# Patient Record
Sex: Female | Born: 1955 | Race: White | Hispanic: No | Marital: Married | Smoking: Never smoker
Health system: Southern US, Community
[De-identification: ages and names within clinical notes are randomized; demographics above are authoritative.]

## PROBLEM LIST (undated history)

## (undated) DIAGNOSIS — T8859XA Other complications of anesthesia, initial encounter: Secondary | ICD-10-CM

## (undated) DIAGNOSIS — Z853 Personal history of malignant neoplasm of breast: Secondary | ICD-10-CM

## (undated) DIAGNOSIS — C801 Malignant (primary) neoplasm, unspecified: Secondary | ICD-10-CM

## (undated) DIAGNOSIS — I10 Essential (primary) hypertension: Secondary | ICD-10-CM

## (undated) DIAGNOSIS — C50911 Malignant neoplasm of unspecified site of right female breast: Secondary | ICD-10-CM

## (undated) DIAGNOSIS — R7303 Prediabetes: Secondary | ICD-10-CM

## (undated) DIAGNOSIS — Z9889 Other specified postprocedural states: Secondary | ICD-10-CM

## (undated) DIAGNOSIS — K759 Inflammatory liver disease, unspecified: Secondary | ICD-10-CM

## (undated) DIAGNOSIS — B029 Zoster without complications: Secondary | ICD-10-CM

## (undated) DIAGNOSIS — C50919 Malignant neoplasm of unspecified site of unspecified female breast: Secondary | ICD-10-CM

## (undated) DIAGNOSIS — R112 Nausea with vomiting, unspecified: Secondary | ICD-10-CM

## (undated) DIAGNOSIS — C189 Malignant neoplasm of colon, unspecified: Secondary | ICD-10-CM

## (undated) HISTORY — PX: BREAST BIOPSY: SHX20

## (undated) HISTORY — PX: COLONOSCOPY: SHX174

## (undated) HISTORY — DX: Zoster without complications: B02.9

## (undated) HISTORY — DX: Personal history of malignant neoplasm of breast: Z85.3

## (undated) HISTORY — DX: Malignant neoplasm of unspecified site of right female breast: C50.911

## (undated) HISTORY — DX: Other specified postprocedural states: Z98.890

## (undated) HISTORY — DX: Inflammatory liver disease, unspecified: K75.9

## (undated) HISTORY — DX: Prediabetes: R73.03

## (undated) HISTORY — DX: Essential (primary) hypertension: I10

## (undated) HISTORY — DX: Malignant (primary) neoplasm, unspecified: C80.1

---

## 1997-06-11 DIAGNOSIS — I10 Essential (primary) hypertension: Secondary | ICD-10-CM

## 1997-06-11 HISTORY — DX: Essential (primary) hypertension: I10

## 2005-03-21 ENCOUNTER — Ambulatory Visit: Payer: Self-pay | Admitting: General Surgery

## 2006-06-06 ENCOUNTER — Ambulatory Visit: Payer: Self-pay | Admitting: General Surgery

## 2007-08-21 ENCOUNTER — Ambulatory Visit: Payer: Self-pay | Admitting: General Surgery

## 2008-09-08 ENCOUNTER — Ambulatory Visit: Payer: Self-pay | Admitting: General Surgery

## 2009-12-20 ENCOUNTER — Ambulatory Visit: Payer: Self-pay | Admitting: Family Medicine

## 2010-06-11 DIAGNOSIS — C801 Malignant (primary) neoplasm, unspecified: Secondary | ICD-10-CM

## 2010-06-11 DIAGNOSIS — Z853 Personal history of malignant neoplasm of breast: Secondary | ICD-10-CM

## 2010-06-11 DIAGNOSIS — C50919 Malignant neoplasm of unspecified site of unspecified female breast: Secondary | ICD-10-CM

## 2010-06-11 DIAGNOSIS — Z9889 Other specified postprocedural states: Secondary | ICD-10-CM

## 2010-06-11 HISTORY — DX: Malignant neoplasm of unspecified site of unspecified female breast: C50.919

## 2010-06-11 HISTORY — PX: MASTECTOMY: SHX3

## 2010-06-11 HISTORY — DX: Personal history of malignant neoplasm of breast: Z85.3

## 2010-06-11 HISTORY — DX: Malignant (primary) neoplasm, unspecified: C80.1

## 2010-06-11 HISTORY — DX: Other specified postprocedural states: Z98.890

## 2010-09-06 ENCOUNTER — Ambulatory Visit: Payer: Self-pay | Admitting: General Surgery

## 2010-10-10 ENCOUNTER — Ambulatory Visit: Payer: Self-pay | Admitting: Oncology

## 2010-10-31 ENCOUNTER — Ambulatory Visit: Payer: Self-pay | Admitting: Oncology

## 2010-11-09 ENCOUNTER — Ambulatory Visit: Payer: Self-pay | Admitting: Anesthesiology

## 2010-11-10 ENCOUNTER — Ambulatory Visit: Payer: Self-pay | Admitting: Oncology

## 2010-11-13 ENCOUNTER — Ambulatory Visit: Payer: Self-pay | Admitting: General Surgery

## 2010-11-14 LAB — PATHOLOGY REPORT

## 2010-11-28 ENCOUNTER — Ambulatory Visit: Payer: Self-pay | Admitting: General Surgery

## 2010-12-01 LAB — PATHOLOGY REPORT

## 2010-12-12 ENCOUNTER — Ambulatory Visit: Payer: Self-pay | Admitting: Oncology

## 2011-01-10 ENCOUNTER — Ambulatory Visit: Payer: Self-pay | Admitting: Oncology

## 2011-02-10 ENCOUNTER — Ambulatory Visit: Payer: Self-pay | Admitting: Oncology

## 2011-03-12 ENCOUNTER — Ambulatory Visit: Payer: Self-pay | Admitting: Oncology

## 2011-04-12 ENCOUNTER — Ambulatory Visit: Payer: Self-pay | Admitting: Oncology

## 2011-05-12 ENCOUNTER — Ambulatory Visit: Payer: Self-pay | Admitting: Oncology

## 2011-08-20 ENCOUNTER — Ambulatory Visit: Payer: Self-pay | Admitting: Oncology

## 2011-08-20 LAB — COMPREHENSIVE METABOLIC PANEL
Albumin: 3.5 g/dL (ref 3.4–5.0)
Alkaline Phosphatase: 12 U/L — ABNORMAL LOW (ref 50–136)
BUN: 16 mg/dL (ref 7–18)
Bilirubin,Total: 0.4 mg/dL (ref 0.2–1.0)
Chloride: 103 mmol/L (ref 98–107)
Co2: 27 mmol/L (ref 21–32)
Creatinine: 0.96 mg/dL (ref 0.60–1.30)
EGFR (African American): >60
EGFR (Non-African Amer.): >60
Glucose: 128 mg/dL — ABNORMAL HIGH (ref 65–99)
Osmolality: 286 (ref 275–301)
Potassium: 3.7 mmol/L (ref 3.5–5.1)
SGPT (ALT): 74 U/L

## 2011-08-20 LAB — CBC CANCER CENTER
Basophil #: 0.1 x10 3/mm (ref 0.0–0.1)
Basophil %: 2.1 %
Eosinophil #: 0.4 x10 3/mm (ref 0.0–0.7)
Eosinophil %: 6.7 %
HCT: 37.6 % (ref 35.0–47.0)
HGB: 13 g/dL (ref 12.0–16.0)
Lymphocyte %: 32.9 %
MCH: 28 pg (ref 26.0–34.0)
MCHC: 34.5 g/dL (ref 32.0–36.0)
MCV: 81 fL (ref 80–100)
Monocyte #: 0.4 x10 3/mm (ref 0.0–0.7)
RDW: 15.7 % — ABNORMAL HIGH (ref 11.5–14.5)

## 2011-09-10 ENCOUNTER — Ambulatory Visit: Payer: Self-pay | Admitting: Oncology

## 2011-09-10 ENCOUNTER — Ambulatory Visit: Payer: Self-pay | Admitting: General Surgery

## 2011-11-21 ENCOUNTER — Ambulatory Visit: Payer: Self-pay | Admitting: General Surgery

## 2011-12-06 ENCOUNTER — Ambulatory Visit: Payer: Self-pay | Admitting: Anesthesiology

## 2011-12-06 LAB — POTASSIUM: Potassium: 3.7 mmol/L (ref 3.5–5.1)

## 2011-12-14 ENCOUNTER — Inpatient Hospital Stay: Payer: Self-pay | Admitting: General Surgery

## 2011-12-14 HISTORY — PX: COLECTOMY: SHX59

## 2011-12-15 LAB — BASIC METABOLIC PANEL
BUN: 11 mg/dL (ref 7–18)
Chloride: 100 mmol/L (ref 98–107)
Creatinine: 0.82 mg/dL (ref 0.60–1.30)
EGFR (African American): >60
EGFR (Non-African Amer.): >60
Glucose: 144 mg/dL — ABNORMAL HIGH (ref 65–99)
Osmolality: 270 (ref 275–301)
Potassium: 3.8 mmol/L (ref 3.5–5.1)
Sodium: 134 mmol/L — ABNORMAL LOW (ref 136–145)

## 2011-12-15 LAB — CBC WITH DIFFERENTIAL/PLATELET
Basophil %: 0.1 %
Eosinophil #: 0 10*3/uL (ref 0.0–0.7)
Eosinophil %: 0 %
HGB: 12 g/dL (ref 12.0–16.0)
MCH: 27.6 pg (ref 26.0–34.0)
MCHC: 33.3 g/dL (ref 32.0–36.0)
Monocyte #: 0.7 x10 3/mm (ref 0.2–0.9)
Neutrophil %: 86.2 %
Platelet: 249 10*3/uL (ref 150–440)

## 2011-12-16 LAB — CBC WITH DIFFERENTIAL/PLATELET
Basophil #: 0 x10 3/mm 3
Basophil %: 0.4 %
Eosinophil #: 0.1 x10 3/mm 3
Eosinophil %: 1.5 %
HCT: 36.9 %
HGB: 12.2 g/dL
Lymphocyte %: 17.8 %
Lymphs Abs: 1.5 x10 3/mm 3
MCH: 27.4 pg
MCHC: 33 g/dL
MCV: 83 fL
Monocyte #: 0.5 "x10 3/mm "
Monocyte %: 6.3 %
Neutrophil #: 6.4 x10 3/mm 3
Neutrophil %: 74 %
Platelet: 238 x10 3/mm 3
RBC: 4.45 X10 6/mm 3
RDW: 14 %
WBC: 8.7 x10 3/mm 3

## 2011-12-16 LAB — POTASSIUM: Potassium: 3.5 mmol/L

## 2012-02-18 ENCOUNTER — Ambulatory Visit: Payer: Self-pay | Admitting: Oncology

## 2012-02-18 LAB — COMPREHENSIVE METABOLIC PANEL
Albumin: 3.9 g/dL (ref 3.4–5.0)
Alkaline Phosphatase: 117 U/L (ref 50–136)
BUN: 10 mg/dL (ref 7–18)
Bilirubin,Total: 0.3 mg/dL (ref 0.2–1.0)
Calcium, Total: 9.6 mg/dL (ref 8.5–10.1)
Co2: 31 mmol/L (ref 21–32)
Creatinine: 1.05 mg/dL (ref 0.60–1.30)
EGFR (Non-African Amer.): >60
Glucose: 116 mg/dL — ABNORMAL HIGH (ref 65–99)
Osmolality: 283 (ref 275–301)
SGPT (ALT): 50 U/L (ref 12–78)
Sodium: 142 mmol/L (ref 136–145)
Total Protein: 8.3 g/dL — ABNORMAL HIGH (ref 6.4–8.2)

## 2012-02-18 LAB — CBC CANCER CENTER
Basophil #: 0.1 x10 3/mm (ref 0.0–0.1)
Eosinophil #: 0.1 x10 3/mm (ref 0.0–0.7)
Lymphocyte #: 1.8 x10 3/mm (ref 1.0–3.6)
MCH: 26.6 pg (ref 26.0–34.0)
MCHC: 32.2 g/dL (ref 32.0–36.0)
MCV: 83 fL (ref 80–100)
Monocyte #: 0.4 x10 3/mm (ref 0.2–0.9)
Monocyte %: 7.2 %
Neutrophil #: 2.8 x10 3/mm (ref 1.4–6.5)
Neutrophil %: 53.6 %
Platelet: 264 x10 3/mm (ref 150–440)
RBC: 4.82 10*6/uL (ref 3.80–5.20)
RDW: 14.8 % — ABNORMAL HIGH (ref 11.5–14.5)

## 2012-02-19 LAB — CANCER ANTIGEN 27.29: CA 27.29: 15.3 U/mL (ref 0.0–38.6)

## 2012-03-11 ENCOUNTER — Ambulatory Visit: Payer: Self-pay | Admitting: Oncology

## 2012-05-18 IMAGING — CR CERVICAL SPINE - COMPLETE 4+ VIEW
1 series · 6 of 6 positions shown · non-contrast
Comparison: none

REASON FOR EXAM: chest pain, shortness of breath,cervical pain
COMMENTS:

[Series 1: view not recorded · 0.17mm/px · 6 of 6 slices shown]
[im 1/6]
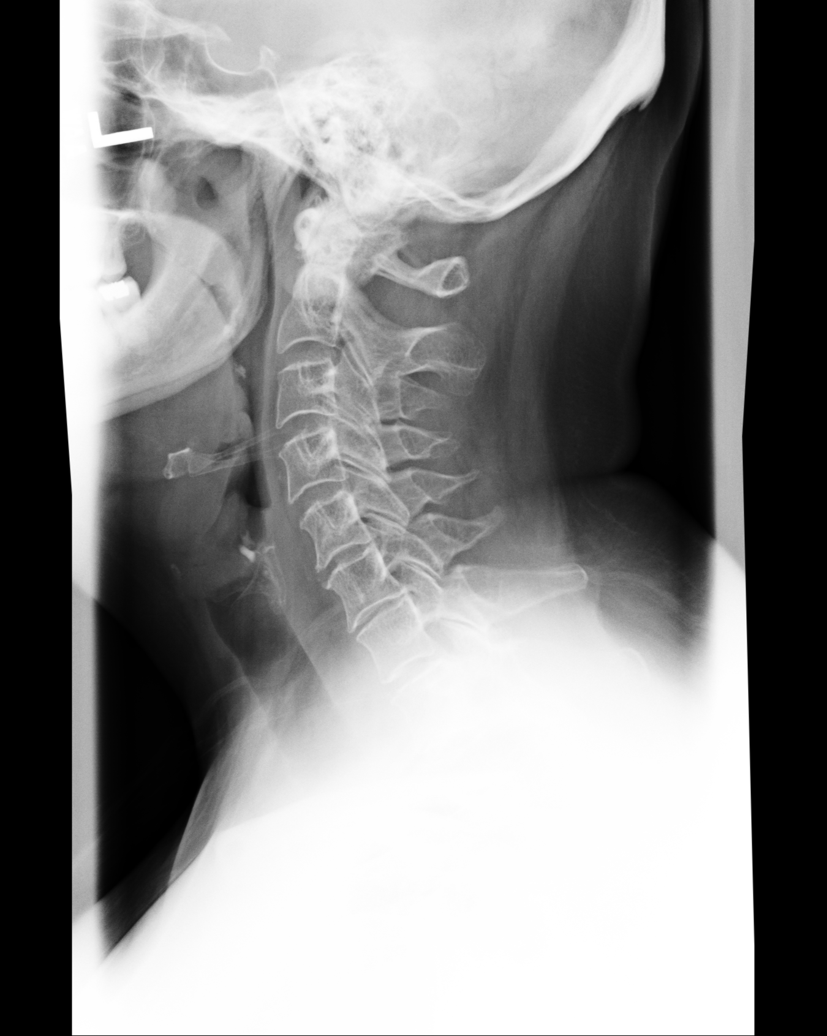
[im 2/6]
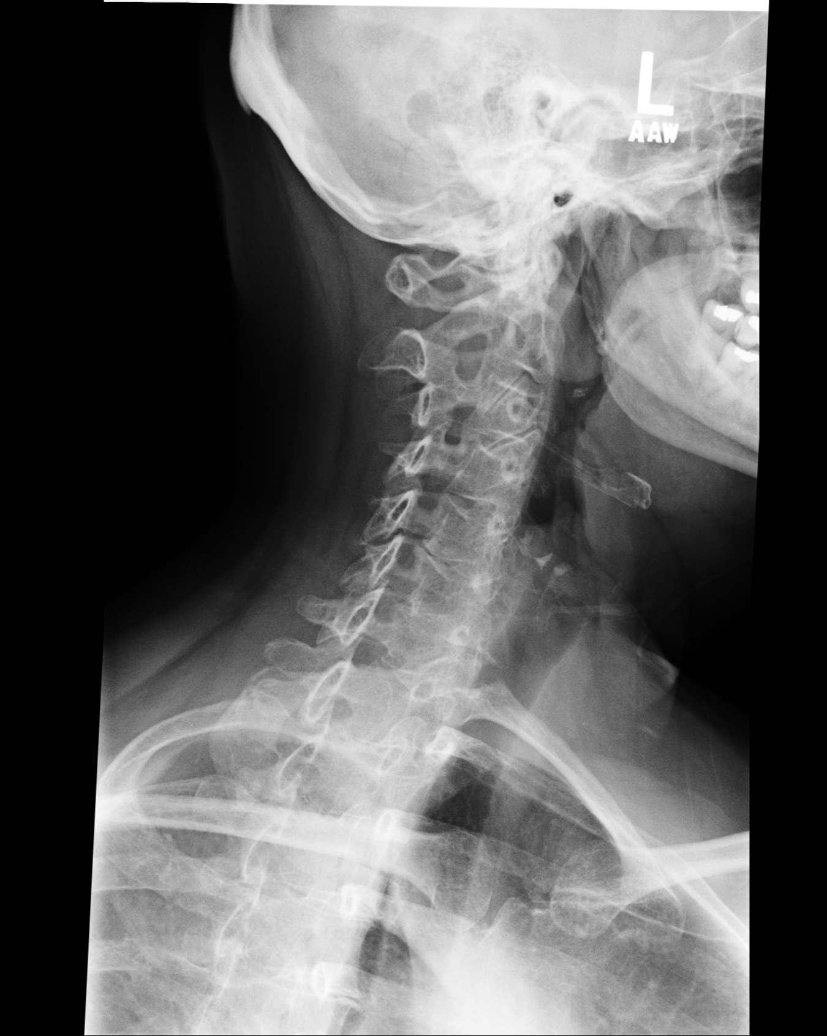
[im 3/6]
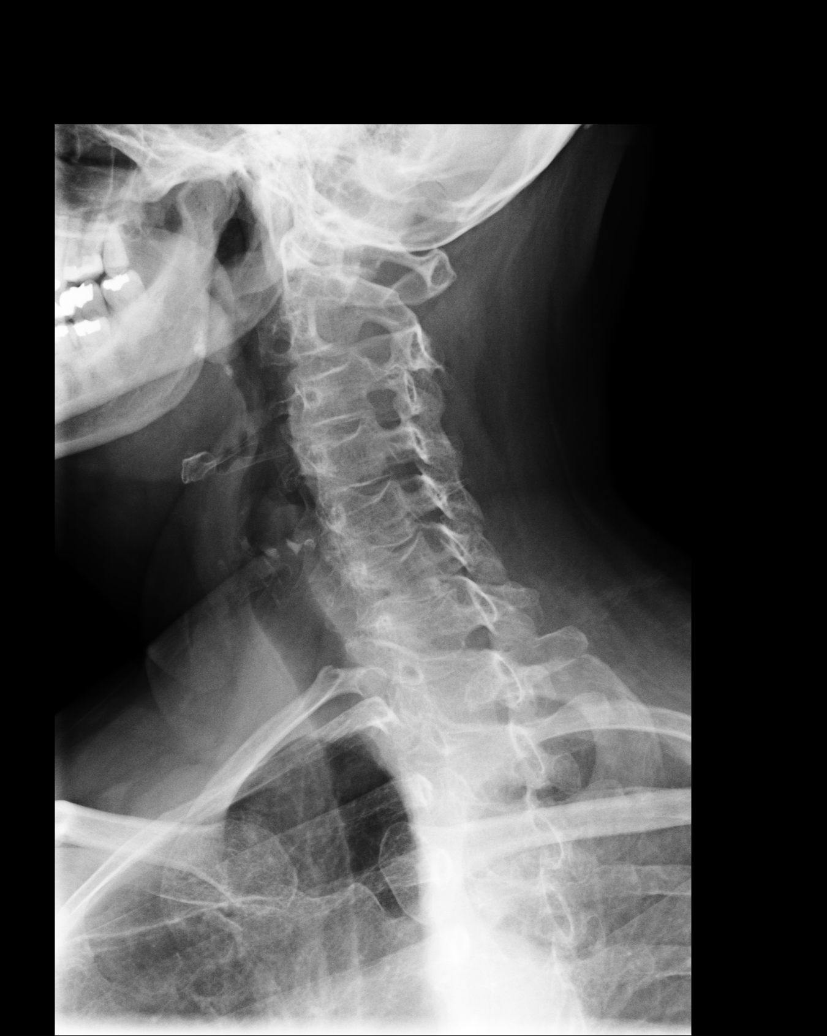
[im 4/6]
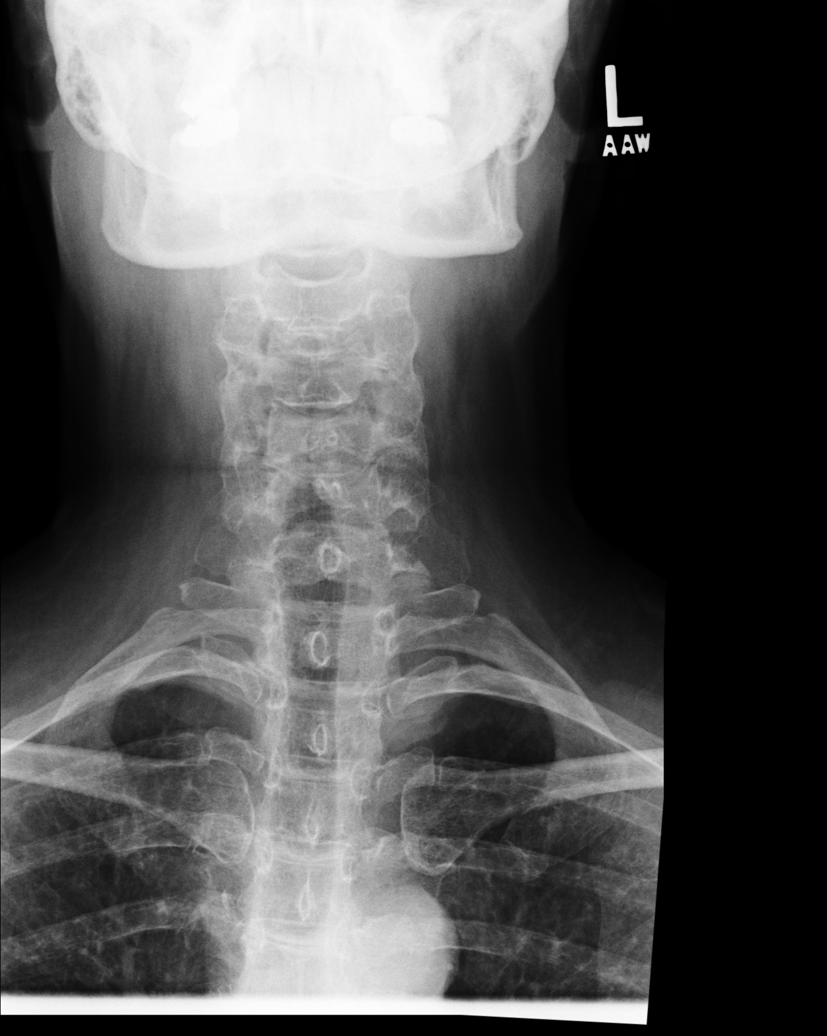
[im 5/6]
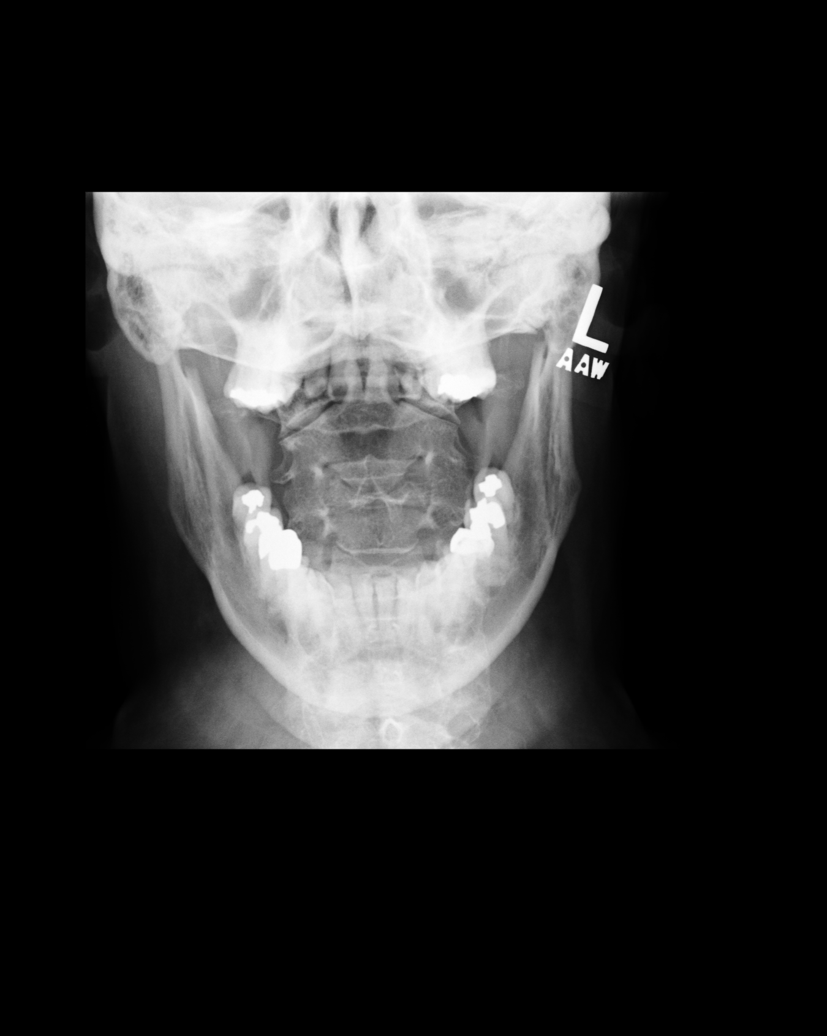
[im 6/6]
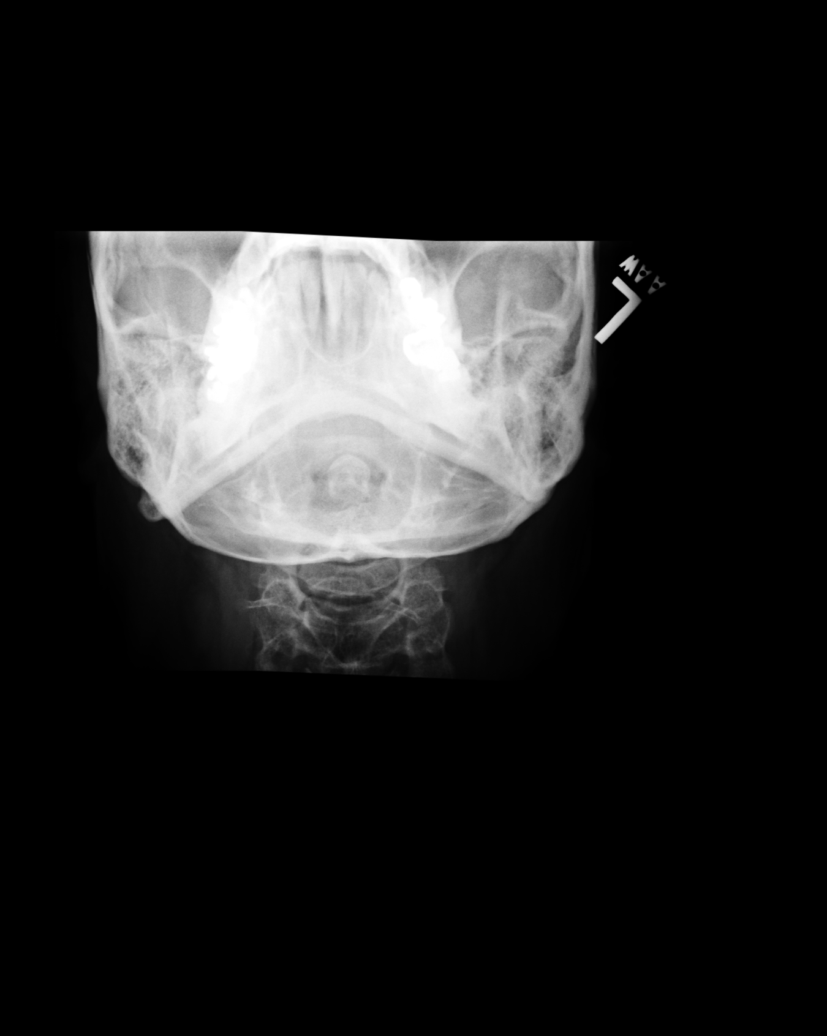

[6 of 6 positions shown; findings below may reference images not displayed]

PROCEDURE:     DXR - DXR CERVICAL SPINE COMPLETE  - December 20, 2009 [DATE]

RESULT:     There is slight spur impingement on the neural foramina
bilaterally at these levels with the findings most prominent at C5-C6 on the
right. The vertebral body alignment is normal. No cervical rib formation is
seen. The odontoid process is intact.
IMPRESSION: 1. No fracture is seen.
2. There are changes consistent with cervical disc disease at C5-C6 and
C6-C7 as noted above and with there being slight spur impingement on the
neural foramina bilaterally at these levels, most prominent at C5-C6 on the
right.

## 2012-06-11 HISTORY — PX: KNEE SURGERY: SHX244

## 2012-07-30 ENCOUNTER — Ambulatory Visit: Payer: Self-pay | Admitting: General Surgery

## 2012-08-01 ENCOUNTER — Encounter: Payer: Self-pay | Admitting: *Deleted

## 2012-08-01 DIAGNOSIS — Z853 Personal history of malignant neoplasm of breast: Secondary | ICD-10-CM | POA: Insufficient documentation

## 2012-08-01 DIAGNOSIS — I10 Essential (primary) hypertension: Secondary | ICD-10-CM | POA: Insufficient documentation

## 2012-08-09 ENCOUNTER — Ambulatory Visit: Payer: Self-pay | Admitting: Oncology

## 2012-08-18 LAB — COMPREHENSIVE METABOLIC PANEL
Anion Gap: 11 (ref 7–16)
Bilirubin,Total: 0.4 mg/dL (ref 0.2–1.0)
Calcium, Total: 9.8 mg/dL (ref 8.5–10.1)
Chloride: 102 mmol/L (ref 98–107)
Co2: 29 mmol/L (ref 21–32)
EGFR (African American): >60
EGFR (Non-African Amer.): 60 — ABNORMAL LOW
Glucose: 122 mg/dL — ABNORMAL HIGH (ref 65–99)
Osmolality: 284 (ref 275–301)
Sodium: 142 mmol/L (ref 136–145)
Total Protein: 8.4 g/dL — ABNORMAL HIGH (ref 6.4–8.2)

## 2012-08-18 LAB — CBC CANCER CENTER
Basophil %: 1.1 %
Eosinophil #: 0.2 x10 3/mm (ref 0.0–0.7)
Eosinophil %: 3.7 %
HCT: 40.2 % (ref 35.0–47.0)
Lymphocyte #: 1.7 x10 3/mm (ref 1.0–3.6)
Lymphocyte %: 29.2 %
MCH: 27.7 pg (ref 26.0–34.0)
MCHC: 33.5 g/dL (ref 32.0–36.0)
Monocyte #: 0.5 x10 3/mm (ref 0.2–0.9)
Neutrophil %: 58.3 %
Platelet: 259 x10 3/mm (ref 150–440)
RBC: 4.87 10*6/uL (ref 3.80–5.20)
RDW: 13.9 % (ref 11.5–14.5)
WBC: 5.9 x10 3/mm (ref 3.6–11.0)

## 2012-08-19 LAB — CANCER ANTIGEN 27.29: CA 27.29: 18.5 U/mL (ref 0.0–38.6)

## 2012-09-09 ENCOUNTER — Ambulatory Visit: Payer: Self-pay | Admitting: Oncology

## 2012-09-10 ENCOUNTER — Ambulatory Visit: Payer: Self-pay | Admitting: General Surgery

## 2012-09-11 ENCOUNTER — Encounter: Payer: Self-pay | Admitting: General Surgery

## 2012-10-02 ENCOUNTER — Encounter: Payer: Self-pay | Admitting: General Surgery

## 2012-10-02 ENCOUNTER — Ambulatory Visit (INDEPENDENT_AMBULATORY_CARE_PROVIDER_SITE_OTHER): Payer: No Typology Code available for payment source | Admitting: General Surgery

## 2012-10-02 ENCOUNTER — Other Ambulatory Visit: Payer: Self-pay | Admitting: *Deleted

## 2012-10-02 VITALS — BP 130/70 | HR 76 | Resp 14 | Ht 67.5 in | Wt 198.0 lb

## 2012-10-02 DIAGNOSIS — Z86010 Personal history of colonic polyps: Secondary | ICD-10-CM

## 2012-10-02 DIAGNOSIS — Z1211 Encounter for screening for malignant neoplasm of colon: Secondary | ICD-10-CM

## 2012-10-02 DIAGNOSIS — Z853 Personal history of malignant neoplasm of breast: Secondary | ICD-10-CM

## 2012-10-02 MED ORDER — POLYETHYLENE GLYCOL 3350 17 GM/SCOOP PO POWD: ORAL | Status: DC

## 2012-10-02 NOTE — Progress Notes (Signed)
The patient has been asked to return to the office in one year for a unilateral left breast diagnostic mammogram.  Patient has been scheduled for a colonoscopy on 11-12-12 at [facility].

## 2012-10-02 NOTE — Patient Instructions (Addendum)
Patient to return in 1 year with left diagnostic mammogram. Colonoscopy to be done in June 2014. Advised her to discuss with Dr.Choksi regarding antihormonal therapy for her breast cancer.  Colonoscopy A colonoscopy is an exam to evaluate your entire colon. In this exam, your colon is cleansed. A long fiberoptic tube is inserted through your rectum and into your colon. The fiberoptic scope (endoscope) is a long bundle of enclosed and very flexible fibers. These fibers transmit light to the area examined and send images from that area to your caregiver. Discomfort is usually minimal. You may be given a drug to help you sleep (sedative) during or prior to the procedure. This exam helps to detect lumps (tumors), polyps, inflammation, and areas of bleeding. Your caregiver may also take a small piece of tissue (biopsy) that will be examined under a microscope. LET YOUR CAREGIVER KNOW ABOUT:   Allergies to food or medicine.  Medicines taken, including vitamins, herbs, eyedrops, over-the-counter medicines, and creams.  Use of steroids (by mouth or creams).  Previous problems with anesthetics or numbing medicines.  History of bleeding problems or blood clots.  Previous surgery.  Other health problems, including diabetes and kidney problems.  Possibility of pregnancy, if this applies. BEFORE THE PROCEDURE   A clear liquid diet may be required for 2 days before the exam.  Ask your caregiver about changing or stopping your regular medications.  Liquid injections (enemas) or laxatives may be required.  A large amount of electrolyte solution may be given to you to drink over a short period of time. This solution is used to clean out your colon.  You should be present 60 minutes prior to your procedure or as directed by your caregiver. AFTER THE PROCEDURE   If you received a sedative or pain relieving medication, you will need to arrange for someone to drive you home.  Occasionally, there is a  little blood passed with the first bowel movement. Do not be concerned. FINDING OUT THE RESULTS OF YOUR TEST Not all test results are available during your visit. If your test results are not back during the visit, make an appointment with your caregiver to find out the results. Do not assume everything is normal if you have not heard from your caregiver or the medical facility. It is important for you to follow up on all of your test results. HOME CARE INSTRUCTIONS   It is not unusual to pass moderate amounts of gas and experience mild abdominal cramping following the procedure. This is due to air being used to inflate your colon during the exam. Walking or a warm pack on your belly (abdomen) may help.  You may resume all normal meals and activities after sedatives and medicines have worn off.  Only take over-the-counter or prescription medicines for pain, discomfort, or fever as directed by your caregiver. Do not use aspirin or blood thinners if a biopsy was taken. Consult your caregiver for medicine usage if biopsies were taken. SEEK IMMEDIATE MEDICAL CARE IF:   You have a fever.  You pass large blood clots or fill a toilet with blood following the procedure. This may also occur 10 to 14 days following the procedure. This is more likely if a biopsy was taken.  You develop abdominal pain that keeps getting worse and cannot be relieved with medicine. Document Released: 05/25/2000 Document Revised: 08/20/2011 Document Reviewed: 01/08/2008 [facility] Patient Information 2013 [place], [place].

## 2012-10-02 NOTE — Progress Notes (Signed)
Patient ID: Shirley Blackwell, female   DOB: November 19, 1955, 57 y.o.   MRN: [id number]  Chief Complaint  Patient presents with  . Follow-up    mammogram    HPI Shirley Blackwell is a 57 y.o. female who presents for a follow up mammogram. The most recent mammogram was done on 09/10/12 with birad category  2. The patient has a history of right breast cancer diagnosed in 2012. She underwent a total right breast mastectomy in June 2012. She was treated with Chemotherapy and Femara. She was taken off of Femara in January 2014 due to side effects. She was offered Taxoxifen but decided not to taken because of side effects of possible uterian cancer. No new problems at this time. She had a colectomy for a very large polyp in right colon done in 2013 and has noticed that she has diarrhea a lot more than before she had the surgery. She has changed her diet drastically by not eating anything with added sugar. She states the diarrhea comes when she eats out and if she has extra sugar intake.   HPI  Past Medical History  Diagnosis Date  . Hepatitis   . Hypertension 1999  . Personal history of malignant neoplasm of breast 2012    R Mastectomy,  . Cancer 2012    right mastectomy invasive lobular CA,  . Hx of lumpectomy 2012    right  . Benign neoplasm of colon 2013    Past Surgical History  Procedure Laterality Date  . Mastectomy Right 2012    invasive lobular CA,  . Colectomy  2013  . Colonoscopy  2013    Dr. Evette Cristal    History reviewed. No pertinent family history.  Social History History  Substance Use Topics  . Smoking status: Never Smoker   . Smokeless tobacco: Not on file  . Alcohol Use: No    Allergies  Allergen Reactions  . Sulfa Antibiotics Hives  . Tape Other (See Comments)    blisters    Current Outpatient Prescriptions  Medication Sig Dispense Refill  . amLODipine-benazepril (LOTREL) 5-10 MG per capsule Take 1 capsule by mouth daily.      . Multiple Vitamin (MULTIVITAMIN) capsule Take  1 capsule by mouth daily.      Marland Kitchen triamterene-hydrochlorothiazide (DYAZIDE) 37.5-25 MG per capsule Take 0.5 capsules by mouth every morning.      . polyethylene glycol powder (GLYCOLAX/MIRALAX) powder 255 grams one bottle for colonoscopy prep  255 g  0   No current facility-administered medications for this visit.    Review of Systems Review of Systems  Constitutional: Negative.   Respiratory: Negative.   Cardiovascular: Negative.     Blood pressure 130/70, pulse 76, resp. rate 14, height 5' 7.5" (1.715 m), weight 198 lb (89.812 kg).  Physical Exam Physical Exam  Constitutional: She is oriented to person, place, and time. She appears well-developed and well-nourished.  Eyes: Conjunctivae are normal. No scleral icterus.  Neck: Trachea normal. No mass and no thyromegaly present.  Cardiovascular: Normal rate, regular rhythm and normal heart sounds.   No murmur heard. Pulses:      Dorsalis pedis pulses are 3+ on the right side, and 3+ on the left side.       Posterior tibial pulses are 3+ on the right side, and 3+ on the left side.  No leg edema. No varicose veins.   Pulmonary/Chest: Effort normal and breath sounds normal. Left breast exhibits no inverted nipple, no mass, no nipple discharge,  no skin change and no tenderness.  Right mastectomy site well healed with no evidence of local reoccurance.   Abdominal: Soft. Normal appearance and bowel sounds are normal. There is no hepatosplenomegaly. There is no tenderness. No hernia.  Lymphadenopathy:    She has no cervical adenopathy.    She has no axillary adenopathy.  Neurological: She is alert and oriented to person, place, and time.  Skin: Skin is warm and dry.    Data Reviewed Mammogram reviewed. Stable.   Assessment    Stable exam.     Plan    Pt needs colonoscopy-1 yr post colectomy, in June. Next f/u here in 1 yr with left mammogram        Shirley Blackwell G 10/03/2012, 6:04 AM

## 2012-10-03 ENCOUNTER — Encounter: Payer: Self-pay | Admitting: General Surgery

## 2012-10-17 ENCOUNTER — Ambulatory Visit: Payer: Self-pay | Admitting: Oncology

## 2012-11-07 ENCOUNTER — Other Ambulatory Visit: Payer: Self-pay | Admitting: General Surgery

## 2012-11-07 DIAGNOSIS — Z86010 Personal history of colonic polyps: Secondary | ICD-10-CM

## 2012-11-12 ENCOUNTER — Ambulatory Visit: Payer: Self-pay | Admitting: General Surgery

## 2012-11-12 DIAGNOSIS — Z86010 Personal history of colonic polyps: Secondary | ICD-10-CM

## 2012-11-18 ENCOUNTER — Encounter: Payer: Self-pay | Admitting: General Surgery

## 2012-12-22 ENCOUNTER — Ambulatory Visit: Payer: Self-pay | Admitting: Orthopedic Surgery

## 2012-12-22 LAB — BASIC METABOLIC PANEL
Calcium, Total: 9.4 mg/dL (ref 8.5–10.1)
Chloride: 106 mmol/L (ref 98–107)
Co2: 28 mmol/L (ref 21–32)
Creatinine: 0.76 mg/dL (ref 0.60–1.30)
EGFR (African American): >60
EGFR (Non-African Amer.): >60
Glucose: 97 mg/dL (ref 65–99)
Osmolality: 279 (ref 275–301)
Potassium: 3.8 mmol/L (ref 3.5–5.1)
Sodium: 140 mmol/L (ref 136–145)

## 2012-12-22 LAB — CBC
HCT: 38.7 % (ref 35.0–47.0)
HGB: 12.5 g/dL (ref 12.0–16.0)
MCH: 26.3 pg (ref 26.0–34.0)
MCV: 81 fL (ref 80–100)
Platelet: 263 10*3/uL (ref 150–440)
RDW: 14.3 % (ref 11.5–14.5)
WBC: 5.4 10*3/uL (ref 3.6–11.0)

## 2012-12-22 LAB — PROTIME-INR: Prothrombin Time: 12.9 secs (ref 11.5–14.7)

## 2012-12-22 LAB — APTT: Activated PTT: 32.3 secs (ref 23.6–35.9)

## 2013-02-04 ENCOUNTER — Ambulatory Visit: Payer: Self-pay | Admitting: Orthopedic Surgery

## 2013-02-04 LAB — CBC
HCT: 40.1 % (ref 35.0–47.0)
HGB: 13.7 g/dL (ref 12.0–16.0)
MCV: 82 fL (ref 80–100)
Platelet: 282 10*3/uL (ref 150–440)
RBC: 4.91 10*6/uL (ref 3.80–5.20)
RDW: 14.1 % (ref 11.5–14.5)

## 2013-02-04 LAB — SEDIMENTATION RATE: Erythrocyte Sed Rate: 40 mm/hr — ABNORMAL HIGH (ref 0–30)

## 2013-02-04 LAB — BASIC METABOLIC PANEL
Anion Gap: 9 (ref 7–16)
BUN: 10 mg/dL (ref 7–18)
Chloride: 101 mmol/L (ref 98–107)
EGFR (African American): >60
Potassium: 3.5 mmol/L (ref 3.5–5.1)
Sodium: 137 mmol/L (ref 136–145)

## 2013-02-04 LAB — APTT: Activated PTT: 30.6 secs (ref 23.6–35.9)

## 2013-02-04 LAB — PROTIME-INR: INR: 0.9

## 2013-02-13 ENCOUNTER — Ambulatory Visit: Payer: Self-pay | Admitting: Orthopedic Surgery

## 2013-09-21 ENCOUNTER — Ambulatory Visit: Payer: No Typology Code available for payment source | Admitting: General Surgery

## 2013-10-01 ENCOUNTER — Ambulatory Visit (INDEPENDENT_AMBULATORY_CARE_PROVIDER_SITE_OTHER): Payer: PRIVATE HEALTH INSURANCE | Admitting: General Surgery

## 2013-10-01 ENCOUNTER — Ambulatory Visit: Payer: PRIVATE HEALTH INSURANCE

## 2013-10-01 ENCOUNTER — Encounter: Payer: Self-pay | Admitting: General Surgery

## 2013-10-01 VITALS — BP 130/76 | HR 76 | Resp 14 | Ht 67.5 in | Wt 195.0 lb

## 2013-10-01 DIAGNOSIS — R222 Localized swelling, mass and lump, trunk: Secondary | ICD-10-CM

## 2013-10-01 DIAGNOSIS — Z853 Personal history of malignant neoplasm of breast: Secondary | ICD-10-CM

## 2013-10-01 NOTE — Patient Instructions (Signed)
Patient to return as scheduled next month with mammogram. Continue self breast exams. Call office for any new breast issues or concerns.

## 2013-10-01 NOTE — Progress Notes (Signed)
Patient ID: Shirley Blackwell, female   DOB: [birth year], 58 y.o.   MRN: [id number]  Chief Complaint  Patient presents with  . Follow-up    right mastectomy side under arm nodule    HPI Shirley Blackwell is a 58 y.o. female who presents for a right mastectomy side axilla nodule. She noticed it approximately 1 month ago. She has not notice any change in size. No pain in the area. She is scheduled for a mammogram in May 2015.  HPI  Past Medical History  Diagnosis Date  . Hepatitis   . Hypertension 1999  . Personal history of malignant neoplasm of breast 2012    R Mastectomy,  . Cancer 2012    right mastectomy invasive lobular CA,  . Hx of lumpectomy 2012    right  . Benign neoplasm of colon 2013    Past Surgical History  Procedure Laterality Date  . Mastectomy Right 2012    invasive lobular CA,  . Colonoscopy  2013    Dr. Jamal Collin  . Knee surgery Left 2014  . Colectomy Right 2013    History reviewed. No pertinent family history.  Social History History  Substance Use Topics  . Smoking status: Never Smoker   . Smokeless tobacco: Not on file  . Alcohol Use: No    Allergies  Allergen Reactions  . Sulfa Antibiotics Hives  . Tape Other (See Comments)    blisters    Current Outpatient Prescriptions  Medication Sig Dispense Refill  . amLODipine-benazepril (LOTREL) 5-10 MG per capsule Take 1 capsule by mouth daily.      . Multiple Vitamin (MULTIVITAMIN) capsule Take 1 capsule by mouth daily.      . polyethylene glycol powder (GLYCOLAX/MIRALAX) powder 255 grams one bottle for colonoscopy prep  255 g  0  . triamterene-hydrochlorothiazide (DYAZIDE) 37.5-25 MG per capsule Take 0.5 capsules by mouth every morning.       No current facility-administered medications for this visit.    Review of Systems Review of Systems  Constitutional: Negative.   Respiratory: Negative.   Cardiovascular: Negative.     Blood pressure 130/76, pulse 76, resp. rate 14, height 5' 7.5" (1.715 m),  weight 195 lb (88.451 kg).  Physical Exam Physical Exam  Constitutional: She is oriented to person, place, and time. She appears well-developed and well-nourished.  Pulmonary/Chest:    Lymphadenopathy:    She has no axillary adenopathy.  Neurological: She is alert and oriented to person, place, and time.  Skin: Skin is warm and dry.    Data Reviewed  Ultrasound over palpable thickening showed no findings.   Assessment    Likely resulting scar from axillary and chest incision.     Plan    Follow up as schedule next month with mammogram.        Seeplaputhur G Sankar 10/03/2013, 8:29 AM

## 2013-10-03 ENCOUNTER — Encounter: Payer: Self-pay | Admitting: General Surgery

## 2013-10-15 ENCOUNTER — Ambulatory Visit: Payer: Self-pay | Admitting: General Surgery

## 2013-10-15 ENCOUNTER — Encounter: Payer: Self-pay | Admitting: General Surgery

## 2013-10-22 ENCOUNTER — Encounter: Payer: Self-pay | Admitting: General Surgery

## 2013-10-22 ENCOUNTER — Ambulatory Visit (INDEPENDENT_AMBULATORY_CARE_PROVIDER_SITE_OTHER): Payer: PRIVATE HEALTH INSURANCE | Admitting: General Surgery

## 2013-10-22 VITALS — BP 124/70 | HR 68 | Resp 12 | Ht 67.5 in | Wt 195.0 lb

## 2013-10-22 DIAGNOSIS — Z860100 Personal history of colon polyps, unspecified: Secondary | ICD-10-CM

## 2013-10-22 DIAGNOSIS — Z853 Personal history of malignant neoplasm of breast: Secondary | ICD-10-CM

## 2013-10-22 NOTE — Progress Notes (Signed)
Patient ID: Shirley Blackwell, female   DOB: 1956/05/06, 58 y.o.   MRN: [id number]  Chief Complaint  Patient presents with  . Follow-up    1 year follow up left screening mammogram. History of Breast Cancer    HPI Shirley Blackwell is a 58 y.o. female who presents for a breast evaluation. The patient has a history of right breast cancer diagnosed in 2012. The patient had a right mastectomy at that time. The most recent mammogram was done on 10/15/13. Patient does perform regular self breast checks and gets regular mammograms done. The patient denies any new problems with the breast at this time.    HPI  Past Medical History  Diagnosis Date  . Hepatitis   . Hypertension 1999  . Personal history of malignant neoplasm of breast 2012    R Mastectomy,  . Hx of lumpectomy 2012    right  . Benign neoplasm of colon 2013  . 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 Mastectomy Right 2012    invasive lobular CA,  . Colonoscopy  2013, 2014    Dr. Jamal Collin  . Knee surgery Left 2014  . Colectomy Right 2013    Family History  Problem Relation Age of Onset  . Cancer Sister     Shirley Blackwell    Social History History  Substance Use Topics  . Smoking status: Never Smoker   . Smokeless tobacco: Not on file  . Alcohol Use: No    Allergies  Allergen Reactions  . Sulfa Antibiotics Hives  . Tape Other (See Comments)    blisters    Current Outpatient Prescriptions  Medication Sig Dispense Refill  . amLODipine-benazepril (LOTREL) 5-10 MG per capsule Take 1 capsule by mouth daily.      . Multiple Vitamin (MULTIVITAMIN) capsule Take 1 capsule by mouth daily.      . polyethylene glycol powder (GLYCOLAX/MIRALAX) powder 255 grams one bottle for colonoscopy prep  255 g  0  . triamterene-hydrochlorothiazide (DYAZIDE) 37.5-25 MG per capsule Take 0.5 capsules by mouth every morning.       No current  facility-administered medications for this visit.    Review of Systems Review of Systems  Constitutional: Negative.   Respiratory: Negative.   Cardiovascular: Negative.     Blood pressure 124/70, pulse 68, resp. rate 12, height 5' 7.5" (1.715 m), weight 195 lb (88.451 kg).  Physical Exam Physical Exam  Constitutional: She is oriented to person, place, and time. She appears well-developed and well-nourished.  Eyes: Conjunctivae are normal. No scleral icterus.  Neck: Neck supple. No thyromegaly present.  Cardiovascular: Normal rate, regular rhythm and normal heart sounds.   No murmur heard. Pulmonary/Chest: Effort normal and breath sounds normal. Left breast exhibits no inverted nipple, no mass, no nipple discharge, no skin change and no tenderness.  Well healed right mastectomy site.   Abdominal: Soft. Normal appearance. There is no hepatosplenomegaly. There is no tenderness. No hernia.  Lymphadenopathy:    She has no cervical adenopathy.    She has no axillary adenopathy.  Neurological: She is alert and oriented to person, place, and time.  Skin: Skin is warm and dry.    Data Reviewed  Mammogram report reviewed category 1.   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 Now 3 years post right mastectomy and chemo. Pt did not  tolerate anti hormonal therapy is not currently on any.     Plan    1 year follow up with left screening mammogram.  Discussed reinitiation of anti hormonal therapy. Will have Dr. Oliva Bustard reevaluate pt-she has not seen him in over 1 yr        Seeplaputhur Robinette Haines 10/22/2013, 11:23 AM

## 2013-10-22 NOTE — Patient Instructions (Signed)
Patient to return in 1 year with left screening mammogram. Continue self breast exams. Call office for any new breast issues or concerns.  

## 2013-10-27 ENCOUNTER — Encounter: Payer: Self-pay | Admitting: General Surgery

## 2014-04-12 ENCOUNTER — Encounter: Payer: Self-pay | Admitting: General Surgery

## 2014-09-07 ENCOUNTER — Emergency Department: Payer: Self-pay | Admitting: Student

## 2014-09-07 LAB — CBC
HCT: 43.5 % (ref 35.0–47.0)
HGB: 14.1 g/dL (ref 12.0–16.0)
MCH: 27.2 pg (ref 26.0–34.0)
MCHC: 32.3 g/dL (ref 32.0–36.0)
MCV: 84 fL (ref 80–100)
PLATELETS: 251 10*3/uL (ref 150–440)
RBC: 5.18 10*6/uL (ref 3.80–5.20)
RDW: 13.7 % (ref 11.5–14.5)
WBC: 5.6 10*3/uL (ref 3.6–11.0)

## 2014-09-07 LAB — COMPREHENSIVE METABOLIC PANEL
ALBUMIN: 4.1 g/dL
Alkaline Phosphatase: 84 U/L
Anion Gap: 10 (ref 7–16)
BUN: 13 mg/dL
Bilirubin,Total: 0.4 mg/dL
CREATININE: 0.88 mg/dL
Calcium, Total: 9.4 mg/dL
Chloride: 105 mmol/L
Co2: 28 mmol/L
EGFR (Non-African Amer.): >60
Glucose: 113 mg/dL — ABNORMAL HIGH
Potassium: 3.4 mmol/L — ABNORMAL LOW
SGOT(AST): 26 U/L
SGPT (ALT): 25 U/L
SODIUM: 143 mmol/L
TOTAL PROTEIN: 8.2 g/dL — AB

## 2014-09-07 LAB — APTT: Activated PTT: 27.5 secs (ref 23.6–35.9)

## 2014-09-07 LAB — PROTIME-INR
INR: 0.9
PROTHROMBIN TIME: 12.2 s

## 2014-09-23 ENCOUNTER — Other Ambulatory Visit: Payer: Self-pay

## 2014-09-23 DIAGNOSIS — Z853 Personal history of malignant neoplasm of breast: Secondary | ICD-10-CM

## 2014-09-24 ENCOUNTER — Other Ambulatory Visit: Payer: Self-pay | Admitting: General Surgery

## 2014-09-24 DIAGNOSIS — Z853 Personal history of malignant neoplasm of breast: Secondary | ICD-10-CM

## 2014-09-28 NOTE — Discharge Summary (Signed)
PATIENT NAME:  Shirley Blackwell, Shirley Blackwell MR#:  [id number] DATE OF BIRTH:  03/02/1956  DATE OF ADMISSION:  12/14/2011 DATE OF DISCHARGE:  12/18/2011  HISTORY: This is a 59 year old female who on recent screening colonoscopy was found to have a very large flat polyp occupying the ileocecal region. A right colon resection was therefore recommended and the patient was prepared for surgery The patient has a history of carcinoma of the breast for which she underwent treatment with lumpectomy and radiation and has done well from that with a recent follow-up mammogram showing stable findings. Only other pertinent medical history was that of hypertension under control with medication.   COURSE IN HOSPITAL: The patient was taken to the operating room on 12/14/2011 and underwent laparoscopy with right colon resection. Her postoperative course was basically uncomplicated. She had return of her bowel activity on the third postoperative day at which point she was advanced from a liquid to solid diet which she tolerated well and she was discharged home in stable condition on 12/18/2011 to be followed as an outpatient. Final pathology report revealed that this was a large villous adenoma with no atypia identified. At the time of discharge, the patient was tolerating a diet an her bowels were moving. The incision was healing satisfactorily with no evidence of infection.   FINAL DIAGNOSES:  1. 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  2. History of breast cancer.  3. Hypertension.   OPERATION PERFORMED: Laparoscopy, right hemicolectomy.   ____________________________ S.Robinette Haines, MD sgs:drc D: 01/02/2012 06:32:56 ET T: 01/02/2012 09:17:49 ET JOB#: [id number]  cc: Synthia Innocent. Jamal Collin, MD, <Dictator> [facility] Robinette Haines MD ELECTRONICALLY SIGNED 01/04/2012 7:25

## 2014-10-01 NOTE — Op Note (Signed)
PATIENT NAME:  Shirley Blackwell, Shirley Blackwell MR#:  [id number] DATE OF BIRTH:  [birth year]  DATE OF PROCEDURE:  02/13/2013  PREOPERATIVE DIAGNOSIS: Left knee medial meniscus tear.   POSTOPERATIVE DIAGNOSIS: Left knee medial meniscus, synovitis and osteoarthritis of the medial femoral condyle.   PROCEDURES: Left knee arthroscopic partial medial meniscectomy, chondroplasty of and medial femoral condyle and partial synovectomy.   SURGEON: [place] Park, M.D.   ANESTHESIA: General.   COMPLICATIONS: None.   ESTIMATED BLOOD LOSS: Minimal.   INDICATIONS FOR PROCEDURE: Shirley Blackwell is a 59 year old female who sustained a fall in December 2013. At that time, she developed left medial sided knee pain with mechanical symptoms which have not improved with time and nonoperative management. An MRI has confirmed a medial meniscus tear and the patient wishes to proceed with arthroscopic partial medial meniscectomy.   I reviewed the risks and benefits of surgery with the patient prior to her operation. She understands the risks include, but are not limited to infection, bleeding, nerve or blood vessel injury, knee stiffness, persistent pain or mechanical symptoms, re-tear of the meniscus and the need for further surgery, osteoarthritis. The medical risks include, but are not limited to, deep vein thrombosis and pulmonary embolism, myocardial infarction, stroke, pneumonia, respiratory failure and death. The patient understood these risks, signed the consent form and wished to proceed with surgery.   PROCEDURE NOTE: The patient was met in the preoperative area. I marked the left knee with the word "yes" according to the hospital's right site protocol after verbally confirming with the patient that this was the correct site of surgery. Her husband was at the bedside. I updated her H and P.  The patient was then brought to the operating room where she was placed supine on the operative table. She underwent general anesthesia. She was  prepped and draped in sterile fashion.   A timeout was performed to verify the patient's name, date of birth, medical record number, correct site of surgery and correct procedure to be performed. It was also used to confirm the patient had received antibiotics and that all appropriate instruments and radiographic studies were available in the room. Once all in attendance were in agreement, the case began.   The patient was prepped and draped in a sterile fashion. Her proposed arthroscopic incisions were drawn out with a surgical marker based upon bony landmarks. These were pre-injected with 1% lidocaine plain. An 11 blade was used to establish the inferolateral and inferomedial portals. The arthroscope was placed into the inferolateral portal and a full diagnostic examination of the knee was performed. The medial portal was established during the diagnostic portion of the knee arthroscopy using 18-gauge spinal needle for localization.   Findings on arthroscopy included a complex tear of the posterior horn and body of the medial meniscus. There was diffuse chondral wear, grade II and III throughout the medial femoral condyle and wear of the posterior tibial plateau cartilage as well. The patient had fissuring of the trochlea, but no significant chondral injury to the lateral compartment. The patient had significant synovitis. Her anterior cruciate ligament was intact along with a lateral meniscus.   After arthroscopic images of the diagnostic portion of the exam were performed, the attention was turned to performing a chondroplasty the medial femoral condyle, which aided in visualization of the medial meniscus. A hook probe was used to probe the meniscal tear, prior to debridement. A straight basket and 4-0 and  three 5 mm resector shaver blades were then used to  debride the medial meniscus and contour the remaining meniscus. Following partial medial meniscectomy, final images were performed. A hook probe  again was used and to ensure a stable rim following partial medial meniscectomy. An ArthroCare wand was used to perform an extensive partial synovectomy and a 4-0 resector shaver blade was used to perform debridement of Hoffa's fat pad, which obscured visualization of the lateral compartment initially. Once the synovectomy was performed, the knee joint was copiously irrigated and lavaged. Arthroscopic instruments were then removed. A 4-0 nylon was used to close both arthroscopic portals using simple sutures. A dry, sterile and compressive dressing was applied to the left knee. The patient was then awakened and transferred to a hospital stretcher and brought to the PAC-U in stable condition. I was scrubbed and present for the entire case and all sharp and instrument counts were correct at the conclusion of the case. I spoke with the patient's husband in the postoperative consultation room to let him know that his wife was stable in the recovery room and the case had gone without complication.   ____________________________ Timoteo Gaul, MD klk:cc D: 02/15/2013 23:41:50 ET T: 02/16/2013 00:54:53 ET JOB#: [id number]  cc: Timoteo Gaul, MD, <Dictator> Timoteo Gaul MD ELECTRONICALLY SIGNED 02/24/2013 8:21

## 2014-10-03 NOTE — Op Note (Signed)
PATIENT NAME:  Shirley Blackwell, LASETER MR#:  [id number] DATE OF BIRTH:  [birth year]  DATE OF PROCEDURE:  12/14/2011  PREOPERATIVE DIAGNOSIS: Large right colon polyp.   POSTOPERATIVE DIAGNOSIS: Large right colon polyp.   PROCEDURE: Laparoscopy, right hemicolectomy.   SURGEON: S.G. Jamal Collin, MD  ASSISTANT: Dr. Bary Castilla  ANESTHESIA: General.   COMPLICATIONS: None.   ESTIMATED BLOOD LOSS: 50 to 100 mL.   DRAINS: None.   DESCRIPTION OF PROCEDURE: The patient was put to sleep in the supine position on the operating table. Foley catheter was inserted. The abdomen was prepped and draped out as a sterile field. A small vertical incision was made at the umbilicus and a Veress needle introduced in the peritoneal cavity verified with the hanging drop method and pneumoperitoneum was obtained. 11 mm port was then placed in the peritoneal cavity and a camera was introduced with good visualization. An epigastric 11 mm in the left upper quadrant, 11 mm ports were then placed. The right colon was satisfactorily exposed and dissection was started of the transverse colon level in the area below the liver and with careful dissection this was carried around the hepatic flexure. Harmonic scalpel was utilized in the course of this dissection. This was continued on along the right side of the right colon all the way around to the cecum which was then adequately mobilized. The terminal ileum was fairly freely mobile. The omental attachment to the transverse colon was then taken down from the midline towards the right side and the mesentery was then satisfactorily exposed. At this point the mobility of the right colon was noted to be full with being able to mobilize it all the way towards the left side of the abdomen. At this point the pneumoperitoneum was released and the abdominal incision was made extending from the umbilical port site incision above as discussed below about 5 cm in length. The fascial opening and the peritoneal  opening extended similarly. After ensuring hemostasis in the wound the right colon was palpated and brought up easily and the entire mobilized right colon was placed outside the incision. The mesentery was then scored from the knee of the terminal ileum down towards its right colic origin and upward towards the junction of the proximal and middle thirds of the transverse colon. The mesentery was then taken down with the use of the Harmonic device with the main ileocolic vessel and a branch of the middle colic on the right ligated separately with 2-0 silk and cut. The anastomosis was then performed with GIA from the terminal ileum to the transverse colon and after ensuring this anastomotic staple line was intact and free of any bleeding, the bowel was transected and the remaining opening closed with a second application of the GIA. The corners of the anastomotic region were then reinforced with 3-0 silk. The mesentery was closed with 3-0 Vicryl. The bowel was then opened to reveal the presence of a 2.5 cm long and a 2 cm wide flat polypoid structure with some minimal firmness in the middle. Gloves were changed and the abdomen was then inspected and no bleeding was encountered. The bowel was placed back in its position. The peritoneum was closed with a running 2-0 Vicryl and the fascia closed with interrupted figure-of-eight stitches of 0 Prolene. The skin in the port site incisions were then closed with subcuticular 4-0 Vicryl, covered with Dermabond. Procedure was well tolerated. She was subsequently returned to recovery room in stable condition.   ____________________________ S.Robinette Haines, MD sgs:cms  D: 12/14/2011 09:49:12 ET T: 12/14/2011 12:07:35 ET JOB#: [id number]  cc: S.G. Jamal Collin, MD, <Dictator>  [facility] Robinette Haines MD ELECTRONICALLY SIGNED 12/15/2011 11:30

## 2014-10-18 ENCOUNTER — Ambulatory Visit
Admission: RE | Admit: 2014-10-18 | Discharge: 2014-10-18 | Disposition: A | Discharge status: Home / Self Care | Payer: 59 | Source: Ambulatory Visit | Attending: General Surgery | Admitting: General Surgery

## 2014-10-18 DIAGNOSIS — Z1231 Encounter for screening mammogram for malignant neoplasm of breast: Secondary | ICD-10-CM | POA: Diagnosis not present

## 2014-10-18 DIAGNOSIS — Z853 Personal history of malignant neoplasm of breast: Secondary | ICD-10-CM | POA: Insufficient documentation

## 2014-10-19 ENCOUNTER — Other Ambulatory Visit: Payer: Self-pay | Admitting: General Surgery

## 2014-10-19 DIAGNOSIS — N63 Unspecified lump in unspecified breast: Secondary | ICD-10-CM

## 2014-10-19 DIAGNOSIS — R928 Other abnormal and inconclusive findings on diagnostic imaging of breast: Secondary | ICD-10-CM

## 2014-10-21 ENCOUNTER — Ambulatory Visit: Admission: RE | Admit: 2014-10-21 | Payer: 59 | Source: Ambulatory Visit

## 2014-10-21 ENCOUNTER — Ambulatory Visit
Admission: RE | Admit: 2014-10-21 | Discharge: 2014-10-21 | Disposition: A | Discharge status: Home / Self Care | Payer: 59 | Source: Ambulatory Visit | Attending: General Surgery | Admitting: General Surgery

## 2014-10-21 DIAGNOSIS — R921 Mammographic calcification found on diagnostic imaging of breast: Secondary | ICD-10-CM | POA: Insufficient documentation

## 2014-10-21 DIAGNOSIS — Z853 Personal history of malignant neoplasm of breast: Secondary | ICD-10-CM | POA: Diagnosis present

## 2014-10-21 DIAGNOSIS — R928 Other abnormal and inconclusive findings on diagnostic imaging of breast: Secondary | ICD-10-CM

## 2014-10-21 DIAGNOSIS — N63 Unspecified lump in unspecified breast: Secondary | ICD-10-CM

## 2014-10-25 ENCOUNTER — Encounter: Payer: Self-pay | Admitting: General Surgery

## 2014-10-25 ENCOUNTER — Ambulatory Visit (INDEPENDENT_AMBULATORY_CARE_PROVIDER_SITE_OTHER): Payer: PRIVATE HEALTH INSURANCE | Admitting: General Surgery

## 2014-10-25 VITALS — BP 128/68 | HR 74 | Resp 14 | Ht 67.0 in | Wt 195.0 lb

## 2014-10-25 DIAGNOSIS — Z86010 Personal history of colonic polyps: Secondary | ICD-10-CM

## 2014-10-25 DIAGNOSIS — Z853 Personal history of malignant neoplasm of breast: Secondary | ICD-10-CM

## 2014-10-25 NOTE — Patient Instructions (Addendum)
Patient will be asked to return to the office in one year with a left screening mammogram.    Patient will be referred to Dr. Oliva Bustard for an appointment regarding hormone therapy.

## 2014-10-25 NOTE — Progress Notes (Signed)
Patient ID: Shirley Blackwell, female   DOB: [birth year], 59 y.o.   MRN: [id number]  Chief Complaint  Patient presents with  . Follow-up    mammogram     HPI Shirley Blackwell is a 59 y.o. female who presents for breast cancer and colon polyp follow up. No interval problems. No breast or GI complaints . HPI  Past Medical History  Diagnosis Date  . Hepatitis   . Hypertension 1999  . Personal history of malignant neoplasm of breast 2012    R Mastectomy,  . Hx of lumpectomy 2012    right  . Benign neoplasm of colon 2013  . 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 Shingles     Past Surgical History  Procedure Laterality Date  . Mastectomy Right 2012    invasive lobular CA,  . Colonoscopy  2013, 2014    Dr. Jamal Collin  . Knee surgery Left 2014  . Colectomy Right 2013    Family History  Problem Relation Age of Onset  . Cancer Sister     Theadora Rama    Social History History  Substance Use Topics  . Smoking status: Never Smoker   . Smokeless tobacco: Not on file  . Alcohol Use: No    Allergies  Allergen Reactions  . Sulfa Antibiotics Hives  . Tape Other (See Comments)    blisters    Current Outpatient Prescriptions  Medication Sig Dispense Refill  . amLODipine-benazepril (LOTREL) 5-10 MG per capsule Take 1 capsule by mouth daily.    . Multiple Vitamin (MULTIVITAMIN) capsule Take 1 capsule by mouth daily.    Marland Kitchen triamterene-hydrochlorothiazide (DYAZIDE) 37.5-25 MG per capsule Take 0.5 capsules by mouth every morning.     No current facility-administered medications for this visit.    Review of Systems Review of Systems  Constitutional: Negative.   Respiratory: Negative.   Cardiovascular: Negative.     Blood pressure 128/68, pulse 74, resp. rate 14, height 5\' 7"  (1.702 m), weight 195 lb (88.451 kg).  Physical Exam Physical Exam  Constitutional: She is oriented to person, place, and time. She appears well-developed and  well-nourished.  Eyes: Conjunctivae are normal. No scleral icterus.  Neck: Neck supple.  Cardiovascular: Normal rate, regular rhythm and normal heart sounds.   Pulmonary/Chest: Effort normal and breath sounds normal. Left breast exhibits no inverted nipple, no mass, no nipple discharge, no skin change and no tenderness.  Right mastectomy site is well healed. Mild scarring noted lateral end- unchanged from before.  Abdominal: Soft. Bowel sounds are normal. There is no tenderness.  Lymphadenopathy:    She has no cervical adenopathy.    She has no axillary adenopathy.  Neurological: She is alert and oriented to person, place, and time.  Skin: Skin is warm and dry.    Data Reviewed Mammograms reviewed.   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  History of right colon polyp, s/p right colectomy    Plan    Patient will be asked to return to the office in one year with a left screening mammogram.     Patient will be referred to Dr. Oliva Bustard for an appointment regarding hormone therapy. Pt did not tolerate tamoxifen -took it for15 mos.  Colonoscopy last in 2014. Will need one next yr.  SANKAR,SEEPLAPUTHUR G 10/26/2014, 4:32 PM

## 2014-10-26 ENCOUNTER — Encounter: Payer: Self-pay | Admitting: General Surgery

## 2014-11-10 ENCOUNTER — Ambulatory Visit: Payer: 59 | Admitting: Oncology

## 2014-11-16 ENCOUNTER — Inpatient Hospital Stay: Payer: 59

## 2014-11-16 ENCOUNTER — Encounter (INDEPENDENT_AMBULATORY_CARE_PROVIDER_SITE_OTHER): Payer: Self-pay

## 2014-11-16 ENCOUNTER — Inpatient Hospital Stay: Payer: 59 | Attending: Oncology | Admitting: Oncology

## 2014-11-16 ENCOUNTER — Encounter: Payer: Self-pay | Admitting: Oncology

## 2014-11-16 VITALS — BP 117/84 | HR 101 | Temp 96.0°F | Wt 199.7 lb

## 2014-11-16 DIAGNOSIS — Z8619 Personal history of other infectious and parasitic diseases: Secondary | ICD-10-CM | POA: Diagnosis not present

## 2014-11-16 DIAGNOSIS — Z79811 Long term (current) use of aromatase inhibitors: Secondary | ICD-10-CM | POA: Insufficient documentation

## 2014-11-16 DIAGNOSIS — Z9221 Personal history of antineoplastic chemotherapy: Secondary | ICD-10-CM | POA: Diagnosis not present

## 2014-11-16 DIAGNOSIS — Z79899 Other long term (current) drug therapy: Secondary | ICD-10-CM | POA: Diagnosis not present

## 2014-11-16 DIAGNOSIS — I10 Essential (primary) hypertension: Secondary | ICD-10-CM | POA: Diagnosis not present

## 2014-11-16 DIAGNOSIS — Z170 Estrogen receptor positive status [ER+]: Secondary | ICD-10-CM | POA: Diagnosis not present

## 2014-11-16 DIAGNOSIS — C50919 Malignant neoplasm of unspecified site of unspecified female breast: Secondary | ICD-10-CM

## 2014-11-16 DIAGNOSIS — Z9011 Acquired absence of right breast and nipple: Secondary | ICD-10-CM | POA: Insufficient documentation

## 2014-11-16 DIAGNOSIS — C50411 Malignant neoplasm of upper-outer quadrant of right female breast: Secondary | ICD-10-CM

## 2014-11-16 DIAGNOSIS — Z8049 Family history of malignant neoplasm of other genital organs: Secondary | ICD-10-CM | POA: Insufficient documentation

## 2014-11-16 DIAGNOSIS — Z808 Family history of malignant neoplasm of other organs or systems: Secondary | ICD-10-CM

## 2014-11-16 DIAGNOSIS — Z803 Family history of malignant neoplasm of breast: Secondary | ICD-10-CM

## 2014-11-16 LAB — CBC WITH DIFFERENTIAL/PLATELET
BASOS PCT: 1 %
Basophils Absolute: 0.1 10*3/uL (ref 0–0.1)
Eosinophils Absolute: 0.1 10*3/uL (ref 0–0.7)
Eosinophils Relative: 2 %
HEMATOCRIT: 43.3 % (ref 35.0–47.0)
Hemoglobin: 14.2 g/dL (ref 12.0–16.0)
Lymphocytes Relative: 33 %
Lymphs Abs: 2.4 10*3/uL (ref 1.0–3.6)
MCH: 27.6 pg (ref 26.0–34.0)
MCHC: 32.8 g/dL (ref 32.0–36.0)
MCV: 84.2 fL (ref 80.0–100.0)
MONOS PCT: 6 %
Monocytes Absolute: 0.5 10*3/uL (ref 0.2–0.9)
NEUTROS ABS: 4.2 10*3/uL (ref 1.4–6.5)
NEUTROS PCT: 58 %
Platelets: 286 10*3/uL (ref 150–440)
RBC: 5.14 MIL/uL (ref 3.80–5.20)
RDW: 14.3 % (ref 11.5–14.5)
WBC: 7.3 10*3/uL (ref 3.6–11.0)

## 2014-11-16 LAB — COMPREHENSIVE METABOLIC PANEL
ALT: 35 U/L (ref 14–54)
ANION GAP: 12 (ref 5–15)
AST: 31 U/L (ref 15–41)
Albumin: 4.3 g/dL (ref 3.5–5.0)
Alkaline Phosphatase: 89 U/L (ref 38–126)
BUN: 13 mg/dL (ref 6–20)
CO2: 27 mmol/L (ref 22–32)
CREATININE: 0.8 mg/dL (ref 0.44–1.00)
Calcium: 9.7 mg/dL (ref 8.9–10.3)
Chloride: 103 mmol/L (ref 101–111)
GFR calc Af Amer: >60 mL/min (ref >60)
GLUCOSE: 134 mg/dL — AB (ref 65–99)
Potassium: 3.3 mmol/L — ABNORMAL LOW (ref 3.5–5.1)
Sodium: 142 mmol/L (ref 135–145)
Total Bilirubin: 0.7 mg/dL (ref 0.3–1.2)
Total Protein: 8 g/dL (ref 6.5–8.1)

## 2014-11-16 MED ORDER — EXEMESTANE 25 MG PO TABS
25.0000 mg | ORAL_TABLET | Freq: Every day | ORAL | Status: DC
Start: 2014-11-16 — End: 2015-11-15

## 2014-11-16 NOTE — Progress Notes (Signed)
[place] @ [facility] Telephone:(336) [phone]  Fax:(336) [phone]     Shirley Blackwell OB: 1955/12/31  MR#: [id number]  YPP#:[id number]  Patient Care Team: Ashok Norris, MD as PCP - General (Unknown Physician Specialty) Christene Lye, MD (General Surgery)  CHIEF COMPLAINT:  Chief Complaint  Patient presents with  . Follow-up    Oncology History   1. Carcinoma of breast (right) upper and outer quadrant.  Ultrasound guided biopsy, date Oct 23, 2010. 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 2. Status post mastectomy in June of 2012. T2 N0, M0 tumor (size of the tumor is close to 5 cm) 3. Started on chemotherapy with Cytoxan and Taxotere, July of 2012. 4. Finished Cytoxan, Taxotere for 4 cycles, September of 2012. 5. Started on Femara October of 2012. 6. Patient discontinued Femara due to joint pain, Feb. 2014. 7. Started Tamoxifen, March 2014. 8.  Patient did not take any tamoxifen 9.  Started on Aromasin from June of 2016     Cancer of right breast    Initial Diagnosis Cancer    No flowsheet data found.  INTERVAL HISTORY: 59 year old lady with history of carcinoma of breast status post right mastectomy came today for the follow-up Patient has been lost for follow-up since March of 2014 and patient was started on tamoxifen but did not take tamoxifen.  Patient is now concerned regarding carcinoma of breast.  Her sister is stable of recurrent endometrial cancer   REVIEW OF SYSTEMS:   GENERAL:  Feels good.  Active.  No fevers, sweats or weight loss. PERFORMANCE STATUS (ECOG):  0 HEENT:  No visual changes, runny nose, sore throat, mouth sores or tenderness. Lungs: No shortness of breath or cough.  No hemoptysis. Cardiac:  No chest pain, palpitations, orthopnea, or PND. GI:  No nausea, vomiting, diarrhea, constipation, melena or hematochezia. GU:  No urgency, frequency, dysuria, or  hematuria. Musculoskeletal:  No back pain.  No joint pain.  No muscle tenderness. Extremities:  No pain or swelling. Skin:  No rashes or skin changes. Neuro:  No headache, numbness or weakness, balance or coordination issues. Endocrine:  No diabetes, thyroid issues, hot flashes or night sweats. Psych:  No mood changes, depression or anxiety. Pain:  No focal pain. Review of systems:  All other systems reviewed and found to be negative. As per HPI. Otherwise, a complete review of systems is negatve.  PAST MEDICAL HISTORY: Past Medical History  Diagnosis Date  . Hepatitis   . Hypertension 1999  . Personal history of malignant neoplasm of breast 2012    R Mastectomy,  . Hx of lumpectomy 2012    right  . Benign neoplasm of colon 2013  . 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 Shingles   . Cancer of right breast     right mastectomy invasive lobular CA,     PAST SURGICAL HISTORY: Past Surgical History  Procedure Laterality Date  . Mastectomy Right 2012    invasive lobular CA,  . Colonoscopy  2013, 2014    Dr. Jamal Collin  . Knee surgery Left 2014  . Colectomy Right 2013    FAMILY HISTORY Family History  Problem Relation Age of Onset  . Cancer Sister     Theadora Rama    ADVANCED DIRECTIVES:  Patient does not have any advanced healthcare directive. Information has been given. HEALTH MAINTENANCE: History  Substance Use Topics  . Smoking status:  Never Smoker   . Smokeless tobacco: Not on file  . Alcohol Use: No      Allergies  Allergen Reactions  . Sulfa Antibiotics Hives  . Tape Other (See Comments)    blisters    Current Outpatient Prescriptions  Medication Sig Dispense Refill  . amLODipine-benazepril (LOTREL) 5-10 MG per capsule Take 1 capsule by mouth daily.    . diphenhydrAMINE (SOMINEX) 25 MG tablet Take 25 mg by mouth at bedtime as needed for sleep.    Marland Kitchen triamterene-hydrochlorothiazide (DYAZIDE) 37.5-25 MG per  capsule Take 0.5 capsules by mouth every morning.    Marland Kitchen exemestane (AROMASIN) 25 MG tablet Take 1 tablet (25 mg total) by mouth daily after breakfast. 90 tablet 3  . Multiple Vitamin (MULTIVITAMIN) capsule Take 1 capsule by mouth daily.     No current facility-administered medications for this visit.    OBJECTIVE:  Filed Vitals:   11/16/14 1151  BP: 117/84  Pulse: 101  Temp: 96 F (35.6 C)     Body mass index is 31.28 kg/(m^2).    ECOG FS:0 - Asymptomatic  PHYSICAL EXAM: GENERAL:  Well developed, well nourished, sitting comfortably in the exam room in no acute distress. MENTAL STATUS:  Alert and oriented to person, place and time. HEAD: .  Normocephalic, atraumatic, face symmetric, no Cushingoid features. EYES:    Pupils equal round and reactive to light and accomodation.  No conjunctivitis or scleral icterus. ENT:  Oropharynx clear without lesion.  Tongue normal. Mucous membranes moist.  RESPIRATORY:  Clear to auscultation without rales, wheezes or rhonchi. CARDIOVASCULAR:  Regular rate and rhythm without murmur, rub or gallop. BREAST:  Right breast status post mastectomy there is no evidence of recurrent disease  Left breast without masses, skin changes or nipple discharge. ABDOMEN:  Soft, non-tender, with active bowel sounds, and no hepatosplenomegaly.  No masses. BACK:  No CVA tenderness.  No tenderness on percussion of the back or rib cage. SKIN:  No rashes, ulcers or lesions. EXTREMITIES: No edema, no skin discoloration or tenderness.  No palpable cords. LYMPH NODES: No palpable cervical, supraclavicular, axillary or inguinal adenopathy  NEUROLOGICAL: Unremarkable. PSYCH:  Appropriate.   LAB RESULTS:  Appointment on 11/16/2014  Component Date Value Ref Range Status  . WBC 11/16/2014 7.3  3.6 - 11.0 K/uL Final  . RBC 11/16/2014 5.14  3.80 - 5.20 MIL/uL Final  . Hemoglobin 11/16/2014 14.2  12.0 - 16.0 g/dL Final  . HCT 11/16/2014 43.3  35.0 - 47.0 % Final  . MCV  11/16/2014 84.2  80.0 - 100.0 fL Final  . MCH 11/16/2014 27.6  26.0 - 34.0 pg Final  . MCHC 11/16/2014 32.8  32.0 - 36.0 g/dL Final  . RDW 11/16/2014 14.3  11.5 - 14.5 % Final  . Platelets 11/16/2014 286  150 - 440 K/uL Final  . Neutrophils Relative % 11/16/2014 58   Final  . Neutro Abs 11/16/2014 4.2  1.4 - 6.5 K/uL Final  . Lymphocytes Relative 11/16/2014 33   Final  . Lymphs Abs 11/16/2014 2.4  1.0 - 3.6 K/uL Final  . Monocytes Relative 11/16/2014 6   Final  . Monocytes Absolute 11/16/2014 0.5  0.2 - 0.9 K/uL Final  . Eosinophils Relative 11/16/2014 2   Final  . Eosinophils Absolute 11/16/2014 0.1  0 - 0.7 K/uL Final  . Basophils Relative 11/16/2014 1   Final  . Basophils Absolute 11/16/2014 0.1  0 - 0.1 K/uL Final  . Sodium 11/16/2014 142  135 - 145  mmol/L Final  . Potassium 11/16/2014 3.3* 3.5 - 5.1 mmol/L Final  . Chloride 11/16/2014 103  101 - 111 mmol/L Final  . CO2 11/16/2014 27  22 - 32 mmol/L Final  . Glucose, Bld 11/16/2014 134* 65 - 99 mg/dL Final  . BUN 11/16/2014 13  6 - 20 mg/dL Final  . Creatinine, Ser 11/16/2014 0.80  0.44 - 1.00 mg/dL Final  . Calcium 11/16/2014 9.7  8.9 - 10.3 mg/dL Final  . Total Protein 11/16/2014 8.0  6.5 - 8.1 g/dL Final  . Albumin 11/16/2014 4.3  3.5 - 5.0 g/dL Final  . AST 11/16/2014 31  15 - 41 U/L Final  . ALT 11/16/2014 35  14 - 54 U/L Final  . Alkaline Phosphatase 11/16/2014 89  38 - 126 U/L Final  . Total Bilirubin 11/16/2014 0.7  0.3 - 1.2 mg/dL Final  . GFR calc non Af Amer 11/16/2014 >60  >60 mL/min Final  . GFR calc Af Amer 11/16/2014 >60  >60 mL/min Final   Comment: (NOTE) The eGFR has been calculated using the CKD EPI equation. This calculation has not been validated in all clinical situations. eGFR's persistently <60 mL/min signify possible Chronic Kidney Disease.   . Anion gap 11/16/2014 12  5 - 15 Final      STUDIES: Mm Digital Screening Unilat L  10/28/2014   ADDENDUM REPORT: 10/28/2014 13:40  ADDENDUM: Note that  patient has had a previous malignant right mastectomy.   Electronically Signed   By: Marin Olp M.D.   On: 10/28/2014 13:40   10/28/2014   ADDENDUM REPORT: 10/28/2014 13:40  ADDENDUM: Note that patient has had a previous malignant right mastectomy.   Electronically Signed   By: Marin Olp M.D.   On: 10/28/2014 13:40   10/28/2014   CLINICAL DATA:  Screening.  EXAM: DIGITAL SCREENING UNILATERAL LEFT MAMMOGRAM WITH CAD  COMPARISON:  Previous exam(s).  ACR Breast Density Category b: There are scattered areas of fibroglandular density.  FINDINGS: In the left breast, a possible mass warrants further evaluation. In the right breast, no findings suspicious for malignancy.  Images were processed with CAD.  IMPRESSION: Further evaluation is suggested for possible mass in the left breast.  RECOMMENDATION: Diagnostic mammogram and possibly ultrasound of the left breast. (Code:FI-L-106M)  The patient will be contacted regarding the findings, and additional imaging will be scheduled.  BI-RADS CATEGORY  0: Incomplete. Need additional imaging evaluation and/or prior mammograms for comparison.  Electronically Signed: By: Marin Olp M.D. On: 10/18/2014 16:59   Mm Diag Breast Tomo Uni Left  10/21/2014   CLINICAL DATA:  Call back from screening for a possible retroareolar mass noted on the left breast screening study MLO view only  EXAM: DIGITAL DIAGNOSTIC LEFT MAMMOGRAM WITH 3D TOMOSYNTHESIS AND CAD  COMPARISON:  Prior exams  ACR Breast Density Category b: There are scattered areas of fibroglandular density.  FINDINGS: The possible mass on the current screening study is not reproduced on followup to 2-D MLO view. No mass is seen 3-D imaging. There are no areas of architectural distortion.  Numerous fine and punctate calcifications extend throughout the upper outer quadrant of left breast, unchanged over multiple years. There are no new calcifications.  Mammographic images were processed with CAD.  IMPRESSION: 1. No  evidence of malignancy. 2. Stable left breast calcifications.  RECOMMENDATION: Screening mammogram in one year.(Code:SM-B-01Y)  I have discussed the findings and recommendations with the patient. Results were also provided in writing at the conclusion of the  visit. If applicable, a reminder letter will be sent to the patient regarding the next appointment.  BI-RADS CATEGORY  2: Benign.   Electronically Signed   By: Lajean Manes M.D.   On: 10/21/2014 10:14    ASSESSMENT: Carcinoma of right breast status post mastectomy stage II disease. Patient is here to reestablish care and desires to cord and start anti-hormone therapy Aromasin has been started as patient previously did not tolerate letrozole.  End reluctant to take tamoxifen because of her sister had carcinoma of endometrium  MEDICAL DECISION MAKING:  All lab data has been reviewed.  Markers are pending. Aromasin with calcium and vitamin D at been started Considering patient's history of carcinoma breasts and her sister history of endometrial cancer will consider possibility of mild risk assessment patient was encouraged to get colonoscopy done  Patient expressed understanding and was in agreement with this plan. She also understands that She can call clinic at any time with any questions, concerns, or complaints.    No matching staging information was found for the patient.  Forest Gleason, MD   11/16/2014 11:35 PM

## 2014-11-16 NOTE — Progress Notes (Signed)
Patient never smoked.  Patient does have living will.

## 2014-11-17 LAB — LUTEINIZING HORMONE: LH: 29.6 m[IU]/mL

## 2014-11-17 LAB — ESTRADIOL: Estradiol: 8 pg/mL

## 2014-11-17 LAB — CANCER ANTIGEN 27.29: CA 27.29: 23.9 U/mL (ref 0.0–38.6)

## 2014-11-17 LAB — FOLLICLE STIMULATING HORMONE: FSH: 58.9 m[IU]/mL

## 2014-12-03 ENCOUNTER — Encounter: Payer: Self-pay | Admitting: Emergency Medicine

## 2014-12-06 ENCOUNTER — Encounter: Payer: Self-pay | Admitting: Family Medicine

## 2014-12-06 ENCOUNTER — Ambulatory Visit (INDEPENDENT_AMBULATORY_CARE_PROVIDER_SITE_OTHER): Payer: BLUE CROSS/BLUE SHIELD | Admitting: Family Medicine

## 2014-12-06 VITALS — BP 118/86 | HR 82 | Temp 97.9°F | Resp 18 | Ht 66.0 in | Wt 199.2 lb

## 2014-12-06 DIAGNOSIS — R7303 Prediabetes: Secondary | ICD-10-CM

## 2014-12-06 DIAGNOSIS — Z86010 Personal history of colonic polyps: Secondary | ICD-10-CM | POA: Diagnosis not present

## 2014-12-06 DIAGNOSIS — I10 Essential (primary) hypertension: Secondary | ICD-10-CM | POA: Diagnosis not present

## 2014-12-06 DIAGNOSIS — E669 Obesity, unspecified: Secondary | ICD-10-CM | POA: Insufficient documentation

## 2014-12-06 DIAGNOSIS — K911 Postgastric surgery syndromes: Secondary | ICD-10-CM | POA: Insufficient documentation

## 2014-12-06 DIAGNOSIS — E782 Mixed hyperlipidemia: Secondary | ICD-10-CM | POA: Insufficient documentation

## 2014-12-06 DIAGNOSIS — E2839 Other primary ovarian failure: Secondary | ICD-10-CM | POA: Insufficient documentation

## 2014-12-06 DIAGNOSIS — C50911 Malignant neoplasm of unspecified site of right female breast: Secondary | ICD-10-CM | POA: Diagnosis not present

## 2014-12-06 DIAGNOSIS — F439 Reaction to severe stress, unspecified: Secondary | ICD-10-CM | POA: Insufficient documentation

## 2014-12-06 DIAGNOSIS — E785 Hyperlipidemia, unspecified: Secondary | ICD-10-CM | POA: Insufficient documentation

## 2014-12-06 DIAGNOSIS — Z638 Other specified problems related to primary support group: Secondary | ICD-10-CM | POA: Diagnosis not present

## 2014-12-06 HISTORY — DX: Prediabetes: R73.03

## 2014-12-06 MED ORDER — AMLODIPINE BESY-BENAZEPRIL HCL 5-10 MG PO CAPS: 1.0000 | ORAL_CAPSULE | Freq: Every day | ORAL | Status: DC

## 2014-12-06 MED ORDER — TRIAMTERENE-HCTZ 37.5-25 MG PO CAPS: 1.0000 | ORAL_CAPSULE | ORAL | Status: DC

## 2014-12-06 MED ORDER — TRIAMTERENE-HCTZ 50-25 MG PO CAPS: ORAL_CAPSULE | ORAL | Status: DC

## 2014-12-06 NOTE — Progress Notes (Signed)
Name: Shirley Blackwell   MRN: [id number]    DOB: 1955-07-20   Date:12/06/2014       Progress Note  Subjective  Chief Complaint  Chief Complaint  Patient presents with  . Hypertension  . Hyperlipidemia    Hypertension This is a chronic problem. The current episode started more than 1 month ago. The problem is unchanged. The problem is controlled. Associated symptoms include anxiety. Pertinent negatives include no blurred vision, chest pain, headaches, neck pain, orthopnea, palpitations or shortness of breath. Risk factors for coronary artery disease include dyslipidemia, obesity, post-menopausal state, sedentary lifestyle and stress. Past treatments include calcium channel blockers and diuretics. The current treatment provides moderate improvement. There are no compliance problems.   Hyperlipidemia This is a chronic problem. The current episode started more than 1 year ago. The problem is uncontrolled. Recent lipid tests were reviewed and are high. Exacerbating diseases include obesity. Factors aggravating her hyperlipidemia include fatty foods. Pertinent negatives include no chest pain, focal weakness, myalgias or shortness of breath. Current antihyperlipidemic treatment includes diet change. The current treatment provides mild improvement of lipids. Risk factors for coronary artery disease include dyslipidemia, hypertension, obesity, post-menopausal, a sedentary lifestyle and stress.    History of breast cancer.   Patient is status post mastectomy. She is on a new antiestrogen drug per Dr. Fatima Sanger car and her oncologist and is tolerating it better than she did prior medication.  She has a sister who is in the terminal stages of ovarian cancer with metastasis.     Past Medical History  Diagnosis Date  . Hepatitis   . Hypertension 1999  . Personal history of malignant neoplasm of breast 2012    R Mastectomy,  . Hx of lumpectomy 2012    right  . Benign neoplasm of colon 2013  . 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 Shingles   . Cancer of right breast     right mastectomy invasive lobular CA,     History  Substance Use Topics  . Smoking status: Never Smoker   . Smokeless tobacco: Not on file  . Alcohol Use: No     Current outpatient prescriptions:  .  amLODipine-benazepril (LOTREL) 5-10 MG per capsule, Take 1 capsule by mouth daily., Disp: , Rfl:  .  diphenhydrAMINE (SOMINEX) 25 MG tablet, Take 25 mg by mouth at bedtime as needed for sleep., Disp: , Rfl:  .  exemestane (AROMASIN) 25 MG tablet, Take 1 tablet (25 mg total) by mouth daily after breakfast., Disp: 90 tablet, Rfl: 3 .  Multiple Vitamin (MULTIVITAMIN) capsule, Take 1 capsule by mouth daily., Disp: , Rfl:  .  triamterene-hydrochlorothiazide (DYAZIDE) 37.5-25 MG per capsule, Take 0.5 capsules by mouth every morning., Disp: , Rfl:   Allergies  Allergen Reactions  . Sulfa Antibiotics Hives    Other reaction(s): Hives  . Tape Other (See Comments)    blisters    Review of Systems  Constitutional: Negative for fever, chills and weight loss.  HENT: Negative for congestion, hearing loss, sore throat and tinnitus.   Eyes: Negative for blurred vision, double vision and redness.  Respiratory: Negative for cough, hemoptysis and shortness of breath.   Cardiovascular: Positive for leg swelling. Negative for chest pain, palpitations, orthopnea and claudication.  Gastrointestinal: Negative for heartburn, nausea, vomiting, diarrhea, constipation and blood in stool.  Genitourinary: Negative for dysuria, urgency, frequency and hematuria.  Musculoskeletal: Negative for myalgias, back pain, joint pain, falls  and neck pain.  Skin: Negative for itching.  Neurological: Negative for dizziness, tingling, tremors, focal weakness, seizures, loss of consciousness, weakness and headaches.  Endo/Heme/Allergies: Negative for polydipsia. Does not bruise/bleed easily.  Psychiatric/Behavioral:  Negative for depression and substance abuse. The patient is nervous/anxious (regarding her sister's health and the declining her mother is well). The patient does not have insomnia.      Objective  Filed Vitals:   12/06/14 1454  BP: 118/86  Pulse: 82  Temp: 97.9 F (36.6 C)  Resp: 18  Height: 5\' 6"  (1.676 m)  Weight: 199 lb 4 oz (90.379 kg)  SpO2: 95%     Physical Exam  Constitutional: She is oriented to person, place, and time and well-developed, well-nourished, and in no distress.  HENT:  Head: Normocephalic.  Eyes: EOM are normal. Pupils are equal, round, and reactive to light.  Neck: Normal range of motion. No thyromegaly present.  Cardiovascular: Normal rate, regular rhythm and normal heart sounds.   No murmur heard. Pulmonary/Chest: Effort normal and breath sounds normal.  Abdominal: Soft. Bowel sounds are normal.  Musculoskeletal: Normal range of motion. She exhibits edema.  Neurological: She is alert and oriented to person, place, and time. No cranial nerve deficit. Gait normal.  Skin: Skin is warm and dry. No rash noted.  Psychiatric: Mood, memory, affect and judgment normal.      Assessment & plan  1. Essential hypertension Well-controlled - COMPLETE METABOLIC PANEL WITH GFR   2. Cancer of right breast Follow-up oncologist in general surgeon  3. History of colonic polyps Followed by general surgeon  4. HLD (hyperlipidemia) - TSH - Lipid panel  5.  Stress at home

## 2014-12-06 NOTE — Patient Instructions (Signed)
F/U 6 MO 

## 2014-12-15 LAB — COMPREHENSIVE METABOLIC PANEL
ALT: 25 IU/L (ref 0–32)
AST: 21 IU/L (ref 0–40)
Albumin/Globulin Ratio: 1.3 (ref 1.1–2.5)
Albumin: 4.2 g/dL (ref 3.5–5.5)
Alkaline Phosphatase: 86 IU/L (ref 39–117)
BUN/Creatinine Ratio: 13 (ref 9–23)
BUN: 10 mg/dL (ref 6–24)
Bilirubin Total: 0.4 mg/dL (ref 0.0–1.2)
CALCIUM: 9.3 mg/dL (ref 8.7–10.2)
CHLORIDE: 100 mmol/L (ref 97–108)
CO2: 25 mmol/L (ref 18–29)
Creatinine, Ser: 0.79 mg/dL (ref 0.57–1.00)
GFR calc Af Amer: 96 mL/min/{1.73_m2} (ref >59)
GFR calc non Af Amer: 83 mL/min/{1.73_m2} (ref >59)
Globulin, Total: 3.2 g/dL (ref 1.5–4.5)
Glucose: 103 mg/dL — ABNORMAL HIGH (ref 65–99)
Potassium: 3.8 mmol/L (ref 3.5–5.2)
SODIUM: 141 mmol/L (ref 134–144)
Total Protein: 7.4 g/dL (ref 6.0–8.5)

## 2014-12-15 LAB — LIPID PANEL
CHOL/HDL RATIO: 4.4 ratio (ref 0.0–4.4)
Cholesterol, Total: 208 mg/dL — ABNORMAL HIGH (ref 100–199)
HDL: 47 mg/dL (ref >39)
LDL Calculated: 138 mg/dL — ABNORMAL HIGH (ref 0–99)
Triglycerides: 113 mg/dL (ref 0–149)
VLDL CHOLESTEROL CAL: 23 mg/dL (ref 5–40)

## 2014-12-15 LAB — TSH: TSH: 3.06 u[IU]/mL (ref 0.450–4.500)

## 2014-12-16 ENCOUNTER — Other Ambulatory Visit: Payer: Self-pay

## 2014-12-16 MED ORDER — POTASSIUM CHLORIDE ER 20 MEQ PO TBCR: 8.0000 meq | EXTENDED_RELEASE_TABLET | Freq: Every day | ORAL | Status: DC

## 2014-12-31 ENCOUNTER — Other Ambulatory Visit: Payer: Self-pay | Admitting: Family Medicine

## 2015-03-14 ENCOUNTER — Other Ambulatory Visit: Payer: Self-pay

## 2015-03-14 MED ORDER — AMLODIPINE BESY-BENAZEPRIL HCL 5-10 MG PO CAPS: 1.0000 | ORAL_CAPSULE | Freq: Every day | ORAL | Status: DC

## 2015-03-25 ENCOUNTER — Other Ambulatory Visit: Payer: Self-pay

## 2015-03-25 MED ORDER — TRIAMTERENE-HCTZ 37.5-25 MG PO TABS: 0.5000 | ORAL_TABLET | Freq: Every day | ORAL | Status: DC

## 2015-05-18 ENCOUNTER — Inpatient Hospital Stay: Payer: 59

## 2015-05-18 ENCOUNTER — Inpatient Hospital Stay: Payer: 59 | Admitting: Oncology

## 2015-06-07 ENCOUNTER — Encounter: Payer: Self-pay | Admitting: Family Medicine

## 2015-06-07 ENCOUNTER — Ambulatory Visit (INDEPENDENT_AMBULATORY_CARE_PROVIDER_SITE_OTHER): Payer: 59 | Admitting: Family Medicine

## 2015-06-07 VITALS — BP 122/76 | HR 110 | Temp 98.3°F | Resp 16 | Ht 66.0 in | Wt 201.4 lb

## 2015-06-07 DIAGNOSIS — E785 Hyperlipidemia, unspecified: Secondary | ICD-10-CM | POA: Diagnosis not present

## 2015-06-07 DIAGNOSIS — R739 Hyperglycemia, unspecified: Secondary | ICD-10-CM

## 2015-06-07 DIAGNOSIS — I10 Essential (primary) hypertension: Secondary | ICD-10-CM

## 2015-06-07 DIAGNOSIS — Z23 Encounter for immunization: Secondary | ICD-10-CM

## 2015-06-07 DIAGNOSIS — E669 Obesity, unspecified: Secondary | ICD-10-CM

## 2015-06-07 LAB — GLUCOSE, POCT (MANUAL RESULT ENTRY): POC Glucose: 193 mg/dl — AB (ref 70–99)

## 2015-06-07 LAB — POCT GLYCOSYLATED HEMOGLOBIN (HGB A1C): HEMOGLOBIN A1C: 6.2

## 2015-06-07 MED ORDER — TRIAMTERENE-HCTZ 37.5-25 MG PO TABS: 0.5000 | ORAL_TABLET | Freq: Every day | ORAL | Status: DC

## 2015-06-07 MED ORDER — POTASSIUM CHLORIDE ER 20 MEQ PO TBCR: 20.0000 meq | EXTENDED_RELEASE_TABLET | Freq: Every day | ORAL | Status: DC

## 2015-06-07 MED ORDER — AMLODIPINE BESY-BENAZEPRIL HCL 5-10 MG PO CAPS: 1.0000 | ORAL_CAPSULE | Freq: Every day | ORAL | Status: DC

## 2015-06-07 NOTE — Progress Notes (Signed)
Name: Shirley Blackwell   MRN: [id number]    DOB: [birth year]   Date:06/07/2015       Progress Note  Subjective  Chief Complaint  Chief Complaint  Patient presents with  . Hypertension    6 month follow up    HPI  Hypertension   Patient presents for follow-up of hypertension. It has been present for over 5 years.  Patient states that there is compliance with medical regimen which consists of Maxide 25 amlodipine 5-10 and potassium 20 mEq daily . There is no end organ disease. Cardiac risk factors include hypertension hyperlipidemia and diabetes.  Exercise regimen consist of minimal .  Diet consist of some salt restriction .  Hyperlipidemia  Patient has a history of hyperlipidemia for 1 years.  Current medical regimen consist of diet and exercise .  Compliance is fair at best .  Diet and exercise are currently followed very at best .  Risk factors for cardiovascular disease include hyperlipidemia hypertension obesity and sedentary lifestyle .   There have been no side effects from the medication.    Hyperglycemia  Patient has a history of elevated glucose and A1c in the past. She has not been always compliant with diet and exercise as gained about 1 pound since last visit here. Her last A1c was 6.0 5:15.  Past Medical History  Diagnosis Date  . Hepatitis   . Hypertension 1999  . Personal history of malignant neoplasm of breast 2012    R Mastectomy,  . Hx of lumpectomy 2012    right  . Benign neoplasm of colon 2013  . 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 Shingles   . Cancer of right breast [facility])     right mastectomy invasive lobular CA,     Social History  Substance Use Topics  . Smoking status: Never Smoker   . Smokeless tobacco: Not on file  . Alcohol Use: No     Current outpatient prescriptions:  .  amLODipine-benazepril (LOTREL) 5-10 MG capsule, Take 1 capsule by mouth daily., Disp: 90 capsule, Rfl: 1 .   diphenhydrAMINE (SOMINEX) 25 MG tablet, Take 25 mg by mouth at bedtime as needed for sleep., Disp: , Rfl:  .  exemestane (AROMASIN) 25 MG tablet, Take 1 tablet (25 mg total) by mouth daily after breakfast., Disp: 90 tablet, Rfl: 3 .  Multiple Vitamin (MULTIVITAMIN) capsule, Take 1 capsule by mouth daily., Disp: , Rfl:  .  Potassium Chloride ER 20 MEQ TBCR, Take 20 mEq by mouth daily., Disp: 90 tablet, Rfl: 1 .  triamterene-hydrochlorothiazide (MAXZIDE-25) 37.5-25 MG tablet, Take 0.5 tablets by mouth daily., Disp: 90 tablet, Rfl: 1  Allergies  Allergen Reactions  . Sulfa Antibiotics Hives    Other reaction(s): Hives  . Tape Other (See Comments)    blisters    Review of Systems  Constitutional: Positive for malaise/fatigue. Negative for fever, chills and weight loss.  HENT: Negative for congestion, hearing loss, sore throat and tinnitus.   Eyes: Negative for blurred vision, double vision and redness.  Respiratory: Negative for cough, hemoptysis and shortness of breath.   Cardiovascular: Negative for chest pain, palpitations, orthopnea, claudication and leg swelling.  Gastrointestinal: Negative for heartburn, nausea, vomiting, diarrhea, constipation and blood in stool.  Genitourinary: Negative for dysuria, urgency, frequency and hematuria.  Musculoskeletal: Negative for myalgias, back pain, joint pain, falls and neck pain.  Skin: Negative for itching.  Neurological: Negative for dizziness,  tingling, tremors, focal weakness, seizures, loss of consciousness, weakness and headaches.  Endo/Heme/Allergies: Does not bruise/bleed easily.  Psychiatric/Behavioral: Negative for depression and substance abuse. The patient is not nervous/anxious and does not have insomnia.      Objective  Filed Vitals:   06/07/15 1415  BP: 122/76  Pulse: 110  Temp: 98.3 F (36.8 C)  TempSrc: Oral  Resp: 16  Height: 5\' 6"  (1.676 m)  Weight: 201 lb 6.4 oz (91.354 kg)  SpO2: 97%     Physical Exam   Constitutional: She is oriented to person, place, and time.  Obese and in no acute distress  HENT:  Head: Normocephalic.  Eyes: EOM are normal. Pupils are equal, round, and reactive to light.  Neck: Normal range of motion. No thyromegaly present.  Cardiovascular: Normal rate, regular rhythm and normal heart sounds.   No murmur heard. Pulmonary/Chest: Effort normal and breath sounds normal.  Musculoskeletal: Normal range of motion. She exhibits no edema.  Neurological: She is alert and oriented to person, place, and time. No cranial nerve deficit. Gait normal.  Skin: Skin is warm and dry. No rash noted.  Psychiatric: Memory and affect normal.      Assessment & Plan  1. Essential hypertension Well-controlled - amLODipine-benazepril (LOTREL) 5-10 MG capsule; Take 1 capsule by mouth daily.  Dispense: 90 capsule; Refill: 1 - triamterene-hydrochlorothiazide (MAXZIDE-25) 37.5-25 MG tablet; Take 0.5 tablets by mouth daily.  Dispense: 90 tablet; Refill: 1 - Potassium Chloride ER 20 MEQ TBCR; Take 20 mEq by mouth daily.  Dispense: 90 tablet; Refill: 1 - Comprehensive Metabolic Panel (CMET) - TSH  2. Hyperlipidemia Labs - Lipid panel  3. Hyperglycemia Encourage diet and exercise ADA diet - POCT HgB A1C - POCT Glucose (CBG)  4. Need for influenza vaccination Given - Flu Vaccine QUAD 36+ mos PF IM (Fluarix & Fluzone Quad PF)   5. obesity  Diet and exercise as listed under hyperglycemia

## 2015-08-04 ENCOUNTER — Other Ambulatory Visit: Payer: Self-pay | Admitting: *Deleted

## 2015-08-04 DIAGNOSIS — Z853 Personal history of malignant neoplasm of breast: Secondary | ICD-10-CM

## 2015-08-23 ENCOUNTER — Encounter: Payer: Self-pay | Admitting: *Deleted

## 2015-09-05 ENCOUNTER — Other Ambulatory Visit: Payer: Self-pay | Admitting: Family Medicine

## 2015-09-05 ENCOUNTER — Ambulatory Visit: Payer: 59 | Admitting: Family Medicine

## 2015-10-24 ENCOUNTER — Ambulatory Visit: Payer: 59 | Attending: General Surgery

## 2015-10-28 ENCOUNTER — Other Ambulatory Visit: Payer: Self-pay | Admitting: General Surgery

## 2015-10-28 ENCOUNTER — Ambulatory Visit
Admission: RE | Admit: 2015-10-28 | Discharge: 2015-10-28 | Disposition: A | Discharge status: Home / Self Care | Payer: 59 | Source: Ambulatory Visit | Attending: General Surgery | Admitting: General Surgery

## 2015-10-28 DIAGNOSIS — Z853 Personal history of malignant neoplasm of breast: Secondary | ICD-10-CM

## 2015-10-28 DIAGNOSIS — Z1231 Encounter for screening mammogram for malignant neoplasm of breast: Secondary | ICD-10-CM | POA: Insufficient documentation

## 2015-10-28 HISTORY — DX: Malignant neoplasm of colon, unspecified: C18.9

## 2015-10-28 HISTORY — DX: Malignant neoplasm of unspecified site of unspecified female breast: C50.919

## 2015-11-01 ENCOUNTER — Ambulatory Visit: Payer: PRIVATE HEALTH INSURANCE | Admitting: General Surgery

## 2015-11-03 ENCOUNTER — Encounter: Payer: Self-pay | Admitting: General Surgery

## 2015-11-03 ENCOUNTER — Other Ambulatory Visit: Payer: Self-pay

## 2015-11-03 ENCOUNTER — Ambulatory Visit (INDEPENDENT_AMBULATORY_CARE_PROVIDER_SITE_OTHER): Payer: PRIVATE HEALTH INSURANCE | Admitting: General Surgery

## 2015-11-03 VITALS — BP 130/72 | HR 74 | Resp 12 | Ht 66.0 in | Wt 197.0 lb

## 2015-11-03 DIAGNOSIS — R2231 Localized swelling, mass and lump, right upper limb: Secondary | ICD-10-CM

## 2015-11-03 DIAGNOSIS — Z853 Personal history of malignant neoplasm of breast: Secondary | ICD-10-CM | POA: Diagnosis not present

## 2015-11-03 NOTE — Patient Instructions (Addendum)
  Patient to return for a right axillary mass biopsy in office.  Patient will be asked to return to the office in one year with a left diagnotic mammogram. Continue self breast exams. Call office for any new breast issues or concerns.

## 2015-11-03 NOTE — Progress Notes (Signed)
Patient ID: Shirley Blackwell, female   DOB: 04/26/1956, 60 y.o.   MRN: [id number]  Chief Complaint  Patient presents with  . Follow-up    mammogram     HPI Shirley Blackwell is a 60 y.o. female who presents for a breast cancer follow up. The most recent left breast mammogram was done on 10/28/15 .  Patient does perform regular self breast checks and gets regular mammograms done.  Patient started on Aromasin, she took it for two months and couldn't deal with the side effects.She has missed an appointment she had with oncology. I have reviewed the history of present illness with the patient.   HPI  Past Medical History  Diagnosis Date  . Hepatitis   . Hypertension 1999  . Personal history of malignant neoplasm of breast 2012    R Mastectomy,  . Hx of lumpectomy 2012    right  . Shingles   . 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 Cancer of right breast (Shirley Blackwell)     right mastectomy invasive lobular CA,   . Breast cancer (Shirley Blackwell)     Right breast- chemo  . Colon cancer Shirley Blackwell)     Pre cancerous polyps    Past Surgical History  Procedure Laterality Date  . Mastectomy Right 2012    invasive lobular CA,  . Colonoscopy  2013, 2014    Dr. Jamal Collin  . Knee surgery Left 2014  . Colectomy Right 2013  . Breast biopsy Left     core bx- neg    Family History  Problem Relation Age of Onset  . Cancer Sister     Shirley Blackwell  . Breast cancer Neg Hx     Social History Social History  Substance Use Topics  . Smoking status: Never Smoker   . Smokeless tobacco: None  . Alcohol Use: No    Allergies  Allergen Reactions  . Sulfa Antibiotics Hives    Other reaction(s): Hives  . Tape Other (See Comments)    blisters    Current Outpatient Prescriptions  Medication Sig Dispense Refill  . amLODipine-benazepril (LOTREL) 5-10 MG capsule TAKE 1 CAPSULE BY MOUTH DAILY. 90 capsule 1  . exemestane (AROMASIN) 25 MG tablet Take 1 tablet (25 mg total) by  mouth daily after breakfast. 90 tablet 3  . Multiple Vitamin (MULTIVITAMIN) capsule Take 1 capsule by mouth daily.    . Potassium Chloride ER 20 MEQ TBCR Take 20 mEq by mouth daily. (Patient taking differently: Take 20 mEq by mouth as needed. ) 90 tablet 1   No current facility-administered medications for this visit.    Review of Systems Review of Systems  Constitutional: Negative.   Respiratory: Negative.   Cardiovascular: Negative.     Blood pressure 130/72, pulse 74, resp. rate 12, height 5\' 6"  (1.676 m), weight 197 lb (89.359 kg).  Physical Exam Physical Exam  Constitutional: She is oriented to person, place, and time. She appears well-developed and well-nourished.  Eyes: Conjunctivae are normal. No scleral icterus.  Neck: Neck supple.  Cardiovascular: Normal rate, regular rhythm and normal heart sounds.   Pulmonary/Chest: Effort normal and breath sounds normal. Left breast exhibits no inverted nipple, no mass, no nipple discharge, no skin change and no tenderness.  Right axillary mass near SN incision Feels like a 1-2cm firm scar tissue, possibel fat necrosis. Mildly tender  Abdominal: Soft. Normal appearance and bowel sounds are normal. There is no hepatomegaly. No  hernia.     Lymphadenopathy:    She has no cervical adenopathy.    She has no axillary adenopathy.  Neurological: She is alert and oriented to person, place, and time.  Skin: Skin is warm.    Data Reviewed Left breast mammogram reviewed and stable Targeted US over the palpable thickening in rt axilla- vague suggestion of a hypoechoic mass, may have some calcifications Assessment    History of breast cancer-right, invasive lobular, ER/PR pos and her 2 neg. History of right colon polyp, s/p right colectomy. Exam stable except for questionable mass in right axillary area    Plan   Patient to return for a right axillary mass biopsy in office. Patient will be asked to return to the office in one year with a  left diagnotic mammogram.   Discussed  with Dr. Oliva Bustard from oncology. He will arrange follow up for pt to discuss options of antihormonal therapy PCP:  [place] This information has been scribed by Gaspar Cola CMA.    Aayden Cefalu G 11/03/2015, 3:59 PM

## 2015-11-15 ENCOUNTER — Encounter: Payer: Self-pay | Admitting: Oncology

## 2015-11-15 ENCOUNTER — Inpatient Hospital Stay: Payer: 59 | Attending: Oncology | Admitting: Oncology

## 2015-11-15 VITALS — BP 131/77 | HR 79 | Temp 97.2°F | Resp 18 | Wt 194.4 lb

## 2015-11-15 DIAGNOSIS — C50411 Malignant neoplasm of upper-outer quadrant of right female breast: Secondary | ICD-10-CM | POA: Diagnosis not present

## 2015-11-15 DIAGNOSIS — F419 Anxiety disorder, unspecified: Secondary | ICD-10-CM | POA: Insufficient documentation

## 2015-11-15 DIAGNOSIS — C50911 Malignant neoplasm of unspecified site of right female breast: Secondary | ICD-10-CM

## 2015-11-15 DIAGNOSIS — Z79899 Other long term (current) drug therapy: Secondary | ICD-10-CM | POA: Insufficient documentation

## 2015-11-15 DIAGNOSIS — Z9011 Acquired absence of right breast and nipple: Secondary | ICD-10-CM | POA: Insufficient documentation

## 2015-11-15 DIAGNOSIS — Z9221 Personal history of antineoplastic chemotherapy: Secondary | ICD-10-CM

## 2015-11-15 DIAGNOSIS — I10 Essential (primary) hypertension: Secondary | ICD-10-CM | POA: Insufficient documentation

## 2015-11-15 DIAGNOSIS — Z170 Estrogen receptor positive status [ER+]: Secondary | ICD-10-CM | POA: Insufficient documentation

## 2015-11-15 DIAGNOSIS — Z79811 Long term (current) use of aromatase inhibitors: Secondary | ICD-10-CM

## 2015-11-15 MED ORDER — ANASTROZOLE 1 MG PO TABS: 1.0000 mg | ORAL_TABLET | Freq: Every day | ORAL | Status: DC

## 2015-11-15 NOTE — Progress Notes (Signed)
[place] @ [facility] Telephone:(336) [phone]  Fax:([phone]     Shirley Blackwell OB: 08/30/1955  MR#: [id number]  BOF#:[id number]  Patient Care Team: Ashok Norris, MD as PCP - General (Unknown Physician Specialty) Christene Lye, MD (General Surgery)  CHIEF COMPLAINT:  Chief Complaint  Patient presents with  . Breast Cancer     Oncology History   1. Carcinoma of breast (right) upper and outer quadrant.  Ultrasound guided biopsy, date Oct 23, 2010. 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 2. Status post mastectomy in June of 2012. T2 N0, M0 tumor (size of the tumor is close to 5 cm) 3. Started on chemotherapy with Cytoxan and Taxotere, July of 2012. 4. Finished Cytoxan, Taxotere for 4 cycles, September of 2012. 5. Started on Femara October of 2012. 6. Patient discontinued Femara due to joint pain, Feb. 2014. 7. Started Tamoxifen, March 2014. 8.  Patient did not take any tamoxifen 9.  Started on Aromasin from June of 2016    She discontinued Aromasin 2 months after starting that into  2016 Now started on anastrozole from June of 2017 BCR index pending Patient did not qualify for any genetic evaluation  No flowsheet data found.  INTERVAL HISTORY: 60 year old lady with history of carcinoma of breast status post right mastectomy came today for the follow-up Patient has been lost for follow-up since March of 2014 and patient was started on tamoxifen but did not take tamoxifen.  Patient is now concerned regarding carcinoma of breast.  Patient is extremely anxious No bony pains.  Patient is slightly anxious has palpable nodule in the right breast with mastectomy was done which is going to be biopsied under ultrasound by Dr. Jamal Collin       REVIEW OF SYSTEMS:   GENERAL:  Feels good.  Active.  No fevers, sweats or weight loss. PERFORMANCE STATUS (ECOG):  0 HEENT:  No visual changes, runny  nose, sore throat, mouth sores or tenderness. Lungs: No shortness of breath or cough.  No hemoptysis. Cardiac:  No chest pain, palpitations, orthopnea, or PND. GI:  No nausea, vomiting, diarrhea, constipation, melena or hematochezia. GU:  No urgency, frequency, dysuria, or hematuria. Musculoskeletal:  No back pain.  No joint pain.  No muscle tenderness. Extremities:  No pain or swelling. Skin:  No rashes or skin changes. Neuro:  No headache, numbness or weakness, balance or coordination issues. Endocrine:  No diabetes, thyroid issues, hot flashes or night sweats. Psych:  No mood changes, depression or anxiety. Pain:  No focal pain. Review of systems:  All other systems reviewed and found to be negative. As per HPI. Otherwise, a complete review of systems is negatve.  PAST MEDICAL HISTORY: Past Medical History  Diagnosis Date  . Hepatitis   . Hypertension 1999  . Personal history of malignant neoplasm of breast 2012    R Mastectomy,  . Hx of lumpectomy 2012    right  . Shingles   . 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 Cancer of right breast ([place])     right mastectomy invasive lobular CA,   . Breast cancer ([place])     Right breast- chemo  . Colon cancer ([place])     Pre cancerous polyps    PAST SURGICAL HISTORY: Past Surgical History  Procedure Laterality Date  . Mastectomy Right 2012    invasive lobular CA,  . Colonoscopy  2013,  2014    Dr. Jamal Collin  . Knee surgery Left 2014  . Colectomy Right 2013  . Breast biopsy Left     core bx- neg    FAMILY HISTORY Family History  Problem Relation Age of Onset  . Cancer Sister     Theadora Rama  . Breast cancer Neg Hx     ADVANCED DIRECTIVES:  Patient does not have any advanced healthcare directive. Information has been given. HEALTH MAINTENANCE: Social History  Substance Use Topics  . Smoking status: Never Smoker   . Smokeless tobacco: None  . Alcohol Use: No       Allergies  Allergen Reactions  . Sulfa Antibiotics Hives    Other reaction(s): Hives  . Tape Other (See Comments)    blisters    Current Outpatient Prescriptions  Medication Sig Dispense Refill  . amLODipine-benazepril (LOTREL) 5-10 MG capsule TAKE 1 CAPSULE BY MOUTH DAILY. 90 capsule 1  . Multiple Vitamin (MULTIVITAMIN) capsule Take 1 capsule by mouth daily.    . Potassium Chloride ER 20 MEQ TBCR Take 20 mEq by mouth daily. (Patient taking differently: Take 20 mEq by mouth as needed. ) 90 tablet 1  . Probiotic Product (PROBIOTIC DAILY PO) Take by mouth.    . triamterene (DYRENIUM) 50 MG capsule Take 50 mg by mouth daily.     No current facility-administered medications for this visit.    OBJECTIVE:  Filed Vitals:   11/15/15 1025  BP: 131/77  Pulse: 79  Temp: 97.2 F (36.2 C)  Resp: 18     Body mass index is 31.39 kg/(m^2).    ECOG FS:0 - Asymptomatic  PHYSICAL EXAM: GENERAL:  Well developed, well nourished, sitting comfortably in the exam room in no acute distress. MENTAL STATUS:  Alert and oriented to person, place and time. HEAD: .  Normocephalic, atraumatic, face symmetric, no Cushingoid features. EYES:    Pupils equal round and reactive to light and accomodation.  No conjunctivitis or scleral icterus. ENT:  Oropharynx clear without lesion.  Tongue normal. Mucous membranes moist.  RESPIRATORY:  Clear to auscultation without rales, wheezes or rhonchi. CARDIOVASCULAR:  Regular rate and rhythm without murmur, rub or gallop. BREAST:  Right breast status post mastectomy there is no evidence of recurrent disease .  There is palpable freely mobile nodule in the right breast approximately centimeter in size which is going to be biopsied Left breast without masses, skin changes or nipple discharge. ABDOMEN:  Soft, non-tender, with active bowel sounds, and no hepatosplenomegaly.  No masses. BACK:  No CVA tenderness.  No tenderness on percussion of the back or rib  cage. SKIN:  No rashes, ulcers or lesions. EXTREMITIES: No edema, no skin discoloration or tenderness.  No palpable cords. LYMPH NODES: No palpable cervical, supraclavicular, axillary or inguinal adenopathy  NEUROLOGICAL: Unremarkable. PSYCH:  Appropriate.   LAB RESULTS:  No visits with results within 2 Day(s) from this visit. Latest known visit with results is:  Office Visit on 06/07/2015  Component Date Value Ref Range Status  . Hemoglobin A1C 06/07/2015 6.2   Final  . POC Glucose 06/07/2015 193* 70 - 99 mg/dl Final      STUDIES: Mm Screening Breast Tomo Uni L  10/28/2015  CLINICAL DATA:  Screening. EXAM: 2D DIGITAL SCREENING UNILATERAL LEFT MAMMOGRAM WITH CAD AND ADJUNCT TOMO COMPARISON:  Previous exam(s). ACR Breast Density Category b: There are scattered areas of fibroglandular density. FINDINGS: There are no findings suspicious for malignancy. Images were processed with CAD. IMPRESSION: No mammographic  evidence of malignancy. A result letter of this screening mammogram will be mailed directly to the patient. RECOMMENDATION: Screening mammogram in one year. (Code:SM-B-01Y) BI-RADS CATEGORY  1: Negative. Electronically Signed   By: Everlean Alstrom M.D.   On: 10/28/2015 13:49    ASSESSMENT: Carcinoma of right breast status post mastectomy stage II disease. Patient is here to reestablish care and desires to cord and start anti-hormone therapy Aromasin has been started as patient previously did not tolerate letrozole.  End reluctant to take tamoxifen because of her sister had carcinoma of endometrium  MEDICAL DECISION MAKING:  All lab data has been reviewed.    I will plan able nodule which is going to be biopsied on 11/17/2015 Patient desires to go back on anti-hormone therapy will start patient on anastrozole with calcium and vitamin D and do baseline bone density study.  Patient is almost approaching 5 years from diagnosis of breast cancer so we will order breast cancer index to  decide whether to continue anti-hormonal therapy beyond 5 years.  (Patient did have intermittent anti-hormone therapy after diagnosis of breast cancer). In my absence patient will be evaluated by Dr. B and will follow-up on breast cancer index  Patient may not qualify for genetics evaluation Total duration of visit was19mnutes.  50% or more time was spent in counseling patient and family regarding prognosis and options of treatment and available resources  Patient expressed understanding and was in agreement with this plan. She also understands that She can call clinic at any time with any questions, concerns, or complaints.    No matching staging information was found for the patient.  JForest Gleason MD   11/15/2015 10:34 AM

## 2015-11-15 NOTE — Progress Notes (Signed)
Patient states she is having a breast bx on her right side on Thursday with Dr. Jamal Collin.

## 2015-11-17 ENCOUNTER — Other Ambulatory Visit: Payer: Self-pay

## 2015-11-17 ENCOUNTER — Ambulatory Visit (INDEPENDENT_AMBULATORY_CARE_PROVIDER_SITE_OTHER): Payer: PRIVATE HEALTH INSURANCE | Admitting: General Surgery

## 2015-11-17 ENCOUNTER — Encounter: Payer: Self-pay | Admitting: General Surgery

## 2015-11-17 VITALS — BP 104/62 | HR 66 | Resp 12 | Ht 67.5 in | Wt 196.0 lb

## 2015-11-17 DIAGNOSIS — R2231 Localized swelling, mass and lump, right upper limb: Secondary | ICD-10-CM

## 2015-11-17 NOTE — Progress Notes (Signed)
Patient ID: [name], female   DOB: 1955-07-30, 60 y.o.   MRN: [id number]  Here today for right axillary mass core biopsy. CORE BREAST BIOPSY REPORT  Name:  Shirley Blackwell DOB:  [birth year]  Vital signs:BP 104/62 mmHg  Pulse 66  Resp 12  Ht 5' 7.5" (1.715 m)  Wt 196 lb (88.905 kg)  BMI 30.23 kg/m2  Lesion:  Right axillary mass Location:   Right    Local anesthetic:    60ml of 1% Xylocaine/0.5% Marcaine  Prep:  Chloro Prep Device: 14Gauge   Bard    Ultrasound guidance: used Patient tolerance:  Patient tolerated the procedure well   Approach: LM  Clip:not placed  Dressing: Steristrips, telfa and tape Ice pack applied. Written instructions provided to patient regarding wound care.   Patient advised that patient will be contacted by phone when pathology report is available.  Followup appointment to be scheduled after pathology.   CC: Ashok Norris, MD, Deon Pilling    PCP:  Rutherford Nail This information has been scribed by Karie Fetch RN, BSN,BC.

## 2015-11-17 NOTE — Patient Instructions (Addendum)
The patient is aware to call back for any questions or concerns.    CARE AFTER BIOPSY  1. Leave the dressing on that your doctor applied after the biopsy. It is waterproof. You may bathe, shower and/or swim. The dressing can be removed in 3 days, you will see small strips of tape against your skin on the incision. Do not remove these strips they will gradually fall off in about 2-3 weeks. You may use an ice pack on and off for the first 12-24 hours for comfort.  2. You may want to use a gauze,cloth or similar protection in your bra to prevent rubbing against your dressing and incision. This is not necessary, but you may feel more comfortable doing so.  3. It is recommended that you wear a bra day and night to give support to the breast. This will prevent the weight of the breast from pulling on the incision.  4. Your breast may feel hard and lumpy under the incision. Do not be alarmed. This is the underlying stitching of tissue. Softening of this tissue will occur in time.  5. You may have a follow up appointment or phone follow up in one week after your biopsy. The office phone number is [phone].  6. You will notice about a week or two after your office visit that the strips of the tape on your incision will begin to loosen. These may then be removed.  7. Report to your doctor any of the following:  * Severe pain not relieved by your pain medication  *Redness of the incision  * Drainage from the incision  *Fever greater than 101 degrees

## 2015-11-21 ENCOUNTER — Telehealth: Payer: Self-pay | Admitting: *Deleted

## 2015-11-21 NOTE — Telephone Encounter (Signed)
Patient notified as instructed and verbalizes understanding.   Patient in the recalls for May 2018.

## 2015-11-21 NOTE — Telephone Encounter (Signed)
-----   Message from Christene Lye, MD sent at 11/18/2015  2:26 PM EDT ----- Path is benign. 1 yr follow up with bil diagnostic mammogram

## 2015-11-25 ENCOUNTER — Encounter: Payer: Self-pay | Admitting: Oncology

## 2015-12-16 ENCOUNTER — Encounter: Payer: Self-pay | Admitting: Oncology

## 2015-12-23 ENCOUNTER — Other Ambulatory Visit: Payer: Self-pay | Admitting: Internal Medicine

## 2015-12-23 DIAGNOSIS — Z170 Estrogen receptor positive status [ER+]: Secondary | ICD-10-CM

## 2015-12-23 DIAGNOSIS — C50411 Malignant neoplasm of upper-outer quadrant of right female breast: Secondary | ICD-10-CM

## 2016-02-15 ENCOUNTER — Ambulatory Visit: Payer: 59

## 2016-02-16 ENCOUNTER — Other Ambulatory Visit: Payer: Self-pay

## 2016-02-16 DIAGNOSIS — C50911 Malignant neoplasm of unspecified site of right female breast: Secondary | ICD-10-CM

## 2016-02-17 ENCOUNTER — Inpatient Hospital Stay: Payer: 59

## 2016-02-17 ENCOUNTER — Telehealth: Payer: Self-pay | Admitting: *Deleted

## 2016-02-17 ENCOUNTER — Encounter: Payer: Self-pay | Admitting: Internal Medicine

## 2016-02-17 ENCOUNTER — Other Ambulatory Visit: Payer: Self-pay

## 2016-02-17 ENCOUNTER — Inpatient Hospital Stay: Payer: 59 | Attending: Internal Medicine | Admitting: Internal Medicine

## 2016-02-17 VITALS — BP 121/79 | HR 72 | Temp 98.4°F | Resp 18 | Ht 67.5 in | Wt 195.6 lb

## 2016-02-17 DIAGNOSIS — C50911 Malignant neoplasm of unspecified site of right female breast: Secondary | ICD-10-CM

## 2016-02-17 DIAGNOSIS — M810 Age-related osteoporosis without current pathological fracture: Secondary | ICD-10-CM | POA: Diagnosis not present

## 2016-02-17 DIAGNOSIS — Z9221 Personal history of antineoplastic chemotherapy: Secondary | ICD-10-CM | POA: Insufficient documentation

## 2016-02-17 DIAGNOSIS — Z9011 Acquired absence of right breast and nipple: Secondary | ICD-10-CM | POA: Insufficient documentation

## 2016-02-17 DIAGNOSIS — C50411 Malignant neoplasm of upper-outer quadrant of right female breast: Secondary | ICD-10-CM | POA: Insufficient documentation

## 2016-02-17 DIAGNOSIS — Z170 Estrogen receptor positive status [ER+]: Secondary | ICD-10-CM | POA: Diagnosis not present

## 2016-02-17 DIAGNOSIS — Z79811 Long term (current) use of aromatase inhibitors: Secondary | ICD-10-CM | POA: Diagnosis not present

## 2016-02-17 DIAGNOSIS — I10 Essential (primary) hypertension: Secondary | ICD-10-CM | POA: Diagnosis not present

## 2016-02-17 DIAGNOSIS — Z79899 Other long term (current) drug therapy: Secondary | ICD-10-CM | POA: Diagnosis not present

## 2016-02-17 LAB — COMPREHENSIVE METABOLIC PANEL
ALBUMIN: 4.1 g/dL (ref 3.5–5.0)
ALT: 24 U/L (ref 14–54)
AST: 22 U/L (ref 15–41)
Alkaline Phosphatase: 82 U/L (ref 38–126)
Anion gap: 8 (ref 5–15)
BUN: 13 mg/dL (ref 6–20)
CHLORIDE: 103 mmol/L (ref 101–111)
CO2: 27 mmol/L (ref 22–32)
CREATININE: 0.77 mg/dL (ref 0.44–1.00)
Calcium: 9.4 mg/dL (ref 8.9–10.3)
GFR calc Af Amer: >60 mL/min (ref >60)
Glucose, Bld: 132 mg/dL — ABNORMAL HIGH (ref 65–99)
POTASSIUM: 3.3 mmol/L — AB (ref 3.5–5.1)
SODIUM: 138 mmol/L (ref 135–145)
Total Bilirubin: 0.7 mg/dL (ref 0.3–1.2)
Total Protein: 8 g/dL (ref 6.5–8.1)

## 2016-02-17 LAB — CBC WITH DIFFERENTIAL/PLATELET
BASOS ABS: 0.1 10*3/uL (ref 0–0.1)
BASOS PCT: 1 %
EOS ABS: 0.1 10*3/uL (ref 0–0.7)
EOS PCT: 2 %
HCT: 40.3 % (ref 35.0–47.0)
Hemoglobin: 13.8 g/dL (ref 12.0–16.0)
LYMPHS PCT: 40 %
Lymphs Abs: 2.5 10*3/uL (ref 1.0–3.6)
MCH: 28.3 pg (ref 26.0–34.0)
MCHC: 34.3 g/dL (ref 32.0–36.0)
MCV: 82.5 fL (ref 80.0–100.0)
MONO ABS: 0.4 10*3/uL (ref 0.2–0.9)
Monocytes Relative: 6 %
Neutro Abs: 3.2 10*3/uL (ref 1.4–6.5)
Neutrophils Relative %: 51 %
PLATELETS: 255 10*3/uL (ref 150–440)
RBC: 4.88 MIL/uL (ref 3.80–5.20)
RDW: 13.4 % (ref 11.5–14.5)
WBC: 6.3 10*3/uL (ref 3.6–11.0)

## 2016-02-17 NOTE — Telephone Encounter (Signed)
Contacted breast cancer index and [facility] to obtain [facility]CI reports and genetic testing results.  Results being faxed to office [phone]

## 2016-02-17 NOTE — Progress Notes (Signed)
Genetic testing results.  Pt reports breast exams 3 months ago.  Biopsy June 8th to right armpit area per pt.

## 2016-02-17 NOTE — Assessment & Plan Note (Addendum)
#   Breast cancer ER/PR pos her 2 Neu Neg STAGE II on Aromasin [BCI- Low risk]; reviewed the breast cancer index at length; after discussion patient decided to stop anastrazole. Labs are normal.  # Genetic testing- NEG; review the results with the patient.  # OSTEOPOROSIS- recommend ca= vit D BID; repeat BMD in may 2018/along with mammo-dr.Sankar  Follow up in June 2018/labs. Patient was given a copy of her workup

## 2016-02-17 NOTE — Progress Notes (Signed)
[place] OFFICE PROGRESS NOTE  Patient Care Team: Ashok Norris, MD as PCP - General (Unknown Physician Specialty) Christene Lye, MD (General Surgery)  No matching staging information was found for the patient.   Oncology History   1. Carcinoma of breast (right) upper and outer quadrant.  Ultrasound guided biopsy, date Oct 23, 2010. Invasive LOBULAR CANCER, Estrogen receptor positive, progesterone receptor positive, HER-2 receptor negative, by FISH. 2. Status post mastectomy [Dr.sankar] in June of 2012. T2 N0, M0 tumor (size of the tumor is close to 5 cm) 3. Started on chemotherapy with Cytoxan and Taxotere, July of 2012. 4. Finished Cytoxan, Taxotere for 4 cycles, September of 2012; NO RT.  5. Started on Femara October of 2012. 6. Patient discontinued Femara due to joint pain, Feb. 2014. 7. Started Tamoxifen, March 2014. 8.  Patient did not take any tamoxifen 9.  Started on [place] from June of 2016;  # Aromasinm[June 2017] # June 28th 2017- Breast cancer index- LOW risk of recurrence [3.6%- 5-10 years]; STOP Aromasin Sep 2017.   # Genetic testing- My risk extended panel NEG  # March 2017- BMD- Osteoporosis- recommend ca+ vit D BID [Aug 2017]; order BMD in may 2018      Cancer of right breast [facility])    Initial Diagnosis    Cancer       Breast cancer of upper-outer quadrant of right female breast ([place])   12/23/2015 Initial Diagnosis    Breast cancer of upper-outer quadrant of right female breast [facility])        This is my first interaction with the patient as patient's primary oncologist has been Dr.Choksi. I reviewed the patient's prior charts/pertinent labs/imaging in detail; findings are summarized above.     INTERVAL HISTORY:  Shirley Blackwell 60 y.o.  female pleasant patient above history ofStage II breast cancer ER/PR positive HER-2/neu negative currently on anastrozole is here for follow-up.  Patient denies any significant hot flashes.  Denies any intolerance to antihormone therapy. However interested is coming off antihormone therapy.  Denies any bone pain. Denies any nausea vomiting. Denies any headache.  REVIEW OF SYSTEMS:  A complete 10 point review of system is done which is negative except mentioned above/history of present illness.   PAST MEDICAL HISTORY :  Past Medical History:  Diagnosis Date  . Breast cancer ([place])    Right breast- chemo  . Cancer [facility]) 2012   right mastectomy invasive lobular CA, 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  . Cancer of right breast ([place])    right mastectomy invasive lobular CA,   . Colon cancer ([place])    Pre cancerous polyps  . Hepatitis   . Hx of lumpectomy 2012   right  . Hypertension 1999  . Personal history of malignant neoplasm of breast 2012   R Mastectomy,  . Shingles     PAST SURGICAL HISTORY :   Past Surgical History:  Procedure Laterality Date  . BREAST BIOPSY Left    core bx- neg  . COLECTOMY Right 2013  . COLONOSCOPY  2013, 2014   Dr. Jamal Collin  . KNEE SURGERY Left 2014  . MASTECTOMY Right 2012   invasive lobular CA,    FAMILY HISTORY :   Family History  Problem Relation Age of Onset  . Cancer Sister     Theadora Rama  . Breast cancer Neg Hx     SOCIAL HISTORY:   Social History  Substance Use Topics  . Smoking status: Never Smoker  .  Smokeless tobacco: Never Used  . Alcohol use No    ALLERGIES:  is allergic to sulfa antibiotics and tape.  MEDICATIONS:  Current Outpatient Prescriptions  Medication Sig Dispense Refill  . amLODipine-benazepril (LOTREL) 5-10 MG capsule TAKE 1 CAPSULE BY MOUTH DAILY. 90 capsule 1  . anastrozole (ARIMIDEX) 1 MG tablet Take 1 tablet (1 mg total) by mouth daily. 90 tablet 1  . Multiple Vitamin (MULTIVITAMIN) capsule Take 1 capsule by mouth daily.    . Potassium Chloride ER 20 MEQ TBCR Take 20 mEq by mouth daily. (Patient taking differently: Take 20 mEq by mouth as needed. ) 90 tablet 1  . Probiotic Product (PROBIOTIC DAILY PO)  Take by mouth.    . triamterene (DYRENIUM) 50 MG capsule Take 50 mg by mouth daily.     No current facility-administered medications for this visit.     PHYSICAL EXAMINATION: ECOG PERFORMANCE STATUS: 0 - Asymptomatic  BP 121/79 (BP Location: Left Arm, Patient Position: Sitting)   Pulse 72   Temp 98.4 F (36.9 C) (Tympanic)   Resp 18   Ht 5' 7.5" (1.715 m)   Wt 195 lb 9.6 oz (88.7 kg)   BMI 30.18 kg/m   Filed Weights   02/17/16 1106  Weight: 195 lb 9.6 oz (88.7 kg)    GENERAL: Well-nourished well-developed; Alert, no distress and comfortable.  With her daughter. EYES: no pallor or icterus OROPHARYNX: no thrush or ulceration; good dentition  NECK: supple, no masses felt LYMPH:  no palpable lymphadenopathy in the cervical, axillary or inguinal regions LUNGS: clear to auscultation and  No wheeze or crackles HEART/CVS: regular rate & rhythm and no murmurs; No lower extremity edema ABDOMEN:abdomen soft, non-tender and normal bowel sounds Musculoskeletal:no cyanosis of digits and no clubbing  PSYCH: alert & oriented x 3 with fluent speech NEURO: no focal motor/sensory deficits SKIN:  no rashes or significant lesions  LABORATORY DATA:  I have reviewed the data as listed    Component Value Date/Time   NA 138 02/17/2016 1055   NA 141 12/14/2014 1117   NA 143 09/07/2014 1527   K 3.3 (L) 02/17/2016 1055   K 3.4 (L) 09/07/2014 1527   CL 103 02/17/2016 1055   CL 105 09/07/2014 1527   CO2 27 02/17/2016 1055   CO2 28 09/07/2014 1527   GLUCOSE 132 (H) 02/17/2016 1055   GLUCOSE 113 (H) 09/07/2014 1527   BUN 13 02/17/2016 1055   BUN 10 12/14/2014 1117   BUN 13 09/07/2014 1527   CREATININE 0.77 02/17/2016 1055   CREATININE 0.88 09/07/2014 1527   CALCIUM 9.4 02/17/2016 1055   CALCIUM 9.4 09/07/2014 1527   PROT 8.0 02/17/2016 1055   PROT 7.4 12/14/2014 1117   PROT 8.2 (H) 09/07/2014 1527   ALBUMIN 4.1 02/17/2016 1055   ALBUMIN 4.2 12/14/2014 1117   ALBUMIN 4.1 09/07/2014  1527   AST 22 02/17/2016 1055   AST 26 09/07/2014 1527   ALT 24 02/17/2016 1055   ALT 25 09/07/2014 1527   ALKPHOS 82 02/17/2016 1055   ALKPHOS 84 09/07/2014 1527   BILITOT 0.7 02/17/2016 1055   BILITOT 0.4 12/14/2014 1117   BILITOT 0.4 09/07/2014 1527   GFRNONAA >60 02/17/2016 1055   GFRNONAA >60 09/07/2014 1527   GFRAA >60 02/17/2016 1055   GFRAA >60 09/07/2014 1527    No results found for: SPEP, UPEP  Lab Results  Component Value Date   WBC 6.3 02/17/2016   NEUTROABS 3.2 02/17/2016   HGB 13.8 02/17/2016  HCT 40.3 02/17/2016   MCV 82.5 02/17/2016   PLT 255 02/17/2016      Chemistry      Component Value Date/Time   NA 138 02/17/2016 1055   NA 141 12/14/2014 1117   NA 143 09/07/2014 1527   K 3.3 (L) 02/17/2016 1055   K 3.4 (L) 09/07/2014 1527   CL 103 02/17/2016 1055   CL 105 09/07/2014 1527   CO2 27 02/17/2016 1055   CO2 28 09/07/2014 1527   BUN 13 02/17/2016 1055   BUN 10 12/14/2014 1117   BUN 13 09/07/2014 1527   CREATININE 0.77 02/17/2016 1055   CREATININE 0.88 09/07/2014 1527      Component Value Date/Time   CALCIUM 9.4 02/17/2016 1055   CALCIUM 9.4 09/07/2014 1527   ALKPHOS 82 02/17/2016 1055   ALKPHOS 84 09/07/2014 1527   AST 22 02/17/2016 1055   AST 26 09/07/2014 1527   ALT 24 02/17/2016 1055   ALT 25 09/07/2014 1527   BILITOT 0.7 02/17/2016 1055   BILITOT 0.4 12/14/2014 1117   BILITOT 0.4 09/07/2014 1527       RADIOGRAPHIC STUDIES: I have personally reviewed the radiological images as listed and agreed with the findings in the report. No results found.   ASSESSMENT & PLAN:  Breast cancer of upper-outer quadrant of right female breast ([place]) # Breast cancer ER/PR pos her 2 Neu Neg STAGE II on Aromasin [BCI- Low risk]; stop anastrazole. Labs are normal.  # Genetic testing- NEG; review the results with the patient.  # OSTEOPOROSIS- recommend ca= vit D BID; repeat BMD in may 2018/along with mammo-dr.Sankar  Follow up in June  2018/labs.   Orders Placed This Encounter  Procedures  . DG Bone Density    Standing Status:   Future    Standing Expiration Date:   02/16/2017    Order Specific Question:   Reason for Exam (SYMPTOM  OR DIAGNOSIS REQUIRED)    Answer:   history of breast cancer; currently on Aromatase inhibitor    Order Specific Question:   Is the patient pregnant?    Answer:   No    Order Specific Question:   Preferred imaging location?    Answer:   [facility]   All questions were answered. The patient knows to call the clinic with any problems, questions or concerns.      Cammie Sickle, MD 02/17/2016 1:12 PM

## 2016-03-14 ENCOUNTER — Other Ambulatory Visit: Payer: Self-pay | Admitting: Family Medicine

## 2016-08-14 ENCOUNTER — Other Ambulatory Visit: Payer: Self-pay

## 2016-08-14 DIAGNOSIS — Z1231 Encounter for screening mammogram for malignant neoplasm of breast: Secondary | ICD-10-CM

## 2016-08-28 ENCOUNTER — Other Ambulatory Visit: Payer: Self-pay

## 2016-08-28 MED ORDER — AMLODIPINE BESY-BENAZEPRIL HCL 5-10 MG PO CAPS: 1.0000 | ORAL_CAPSULE | Freq: Every day | ORAL | 0 refills | Status: DC

## 2016-08-28 NOTE — Telephone Encounter (Signed)
Patient has not been seen since 2016 She has an appt for September 10, 2016 I have no record of plain triamterene being prescribed in the last 18 months here I found a Cr and K+ from 2017 I'll approve 2 weeks of the CCB/ARB combo; I will not approve the plain triamterene because I do not see that was prescribed by [place]

## 2016-08-29 ENCOUNTER — Other Ambulatory Visit: Payer: Self-pay

## 2016-09-10 ENCOUNTER — Encounter: Payer: Self-pay | Admitting: Family Medicine

## 2016-09-10 ENCOUNTER — Other Ambulatory Visit: Payer: Self-pay

## 2016-09-10 ENCOUNTER — Ambulatory Visit (INDEPENDENT_AMBULATORY_CARE_PROVIDER_SITE_OTHER): Payer: 59 | Admitting: Family Medicine

## 2016-09-10 ENCOUNTER — Other Ambulatory Visit: Payer: Self-pay | Admitting: Family Medicine

## 2016-09-10 VITALS — BP 114/68 | HR 88 | Temp 97.7°F | Resp 14 | Wt 186.1 lb

## 2016-09-10 DIAGNOSIS — Z0000 Encounter for general adult medical examination without abnormal findings: Secondary | ICD-10-CM

## 2016-09-10 DIAGNOSIS — Z124 Encounter for screening for malignant neoplasm of cervix: Secondary | ICD-10-CM | POA: Diagnosis not present

## 2016-09-10 DIAGNOSIS — N889 Noninflammatory disorder of cervix uteri, unspecified: Secondary | ICD-10-CM | POA: Diagnosis not present

## 2016-09-10 LAB — CBC WITH DIFFERENTIAL/PLATELET
BASOS PCT: 1 %
Basophils Absolute: 53 cells/uL (ref 0–200)
EOS PCT: 3 %
Eosinophils Absolute: 159 cells/uL (ref 15–500)
HEMATOCRIT: 41.5 % (ref 35.0–45.0)
HEMOGLOBIN: 13.8 g/dL (ref 11.7–15.5)
LYMPHS ABS: 2067 {cells}/uL (ref 850–3900)
Lymphocytes Relative: 39 %
MCH: 28 pg (ref 27.0–33.0)
MCHC: 33.3 g/dL (ref 32.0–36.0)
MCV: 84.2 fL (ref 80.0–100.0)
MONO ABS: 424 {cells}/uL (ref 200–950)
MPV: 9.1 fL (ref 7.5–12.5)
Monocytes Relative: 8 %
NEUTROS ABS: 2597 {cells}/uL (ref 1500–7800)
Neutrophils Relative %: 49 %
Platelets: 251 10*3/uL (ref 140–400)
RBC: 4.93 MIL/uL (ref 3.80–5.10)
RDW: 13.8 % (ref 11.0–15.0)
WBC: 5.3 10*3/uL (ref 3.8–10.8)

## 2016-09-10 LAB — COMPLETE METABOLIC PANEL WITH GFR
ALK PHOS: 81 U/L (ref 33–130)
ALT: 19 U/L (ref 6–29)
AST: 18 U/L (ref 10–35)
Albumin: 4 g/dL (ref 3.6–5.1)
BILIRUBIN TOTAL: 0.5 mg/dL (ref 0.2–1.2)
BUN: 12 mg/dL (ref 7–25)
CO2: 29 mmol/L (ref 20–31)
Calcium: 9.7 mg/dL (ref 8.6–10.4)
Chloride: 103 mmol/L (ref 98–110)
Creat: 0.9 mg/dL (ref 0.50–1.05)
GFR, EST AFRICAN AMERICAN: 81 mL/min (ref >=60)
GFR, EST NON AFRICAN AMERICAN: 70 mL/min (ref >=60)
GLUCOSE: 106 mg/dL — AB (ref 65–99)
Potassium: 3.9 mmol/L (ref 3.5–5.3)
SODIUM: 140 mmol/L (ref 135–146)
Total Protein: 7.6 g/dL (ref 6.1–8.1)

## 2016-09-10 LAB — LIPID PANEL
CHOL/HDL RATIO: 4.2 ratio (ref <5.0)
Cholesterol: 216 mg/dL — ABNORMAL HIGH (ref <200)
HDL: 52 mg/dL (ref >50)
LDL CALC: 138 mg/dL — AB (ref <100)
TRIGLYCERIDES: 130 mg/dL (ref <150)
VLDL: 26 mg/dL (ref <30)

## 2016-09-10 NOTE — Assessment & Plan Note (Signed)
Collect pap smear today

## 2016-09-10 NOTE — Patient Instructions (Addendum)
I'll recommend vitamin D 1,000 iu daily   Fall Prevention in the Home Falls can cause injuries. They can happen to people of all ages. There are many things you can do to make your home safe and to help prevent falls. What can I do on the outside of my home?  Regularly fix the edges of walkways and driveways and fix any cracks.  Remove anything that might make you trip as you walk through a door, such as a raised step or threshold.  Trim any bushes or trees on the path to your home.  Use bright outdoor lighting.  Clear any walking paths of anything that might make someone trip, such as rocks or tools.  Regularly check to see if handrails are loose or broken. Make sure that both sides of any steps have handrails.  Any raised decks and porches should have guardrails on the edges.  Have any leaves, snow, or ice cleared regularly.  Use sand or salt on walking paths during winter.  Clean up any spills in your garage right away. This includes oil or grease spills. What can I do in the bathroom?  Use night lights.  Install grab bars by the toilet and in the tub and shower. Do not use towel bars as grab bars.  Use non-skid mats or decals in the tub or shower.  If you need to sit down in the shower, use a plastic, non-slip stool.  Keep the floor dry. Clean up any water that spills on the floor as soon as it happens.  Remove soap buildup in the tub or shower regularly.  Attach bath mats securely with double-sided non-slip rug tape.  Do not have throw rugs and other things on the floor that can make you trip. What can I do in the bedroom?  Use night lights.  Make sure that you have a light by your bed that is easy to reach.  Do not use any sheets or blankets that are too big for your bed. They should not hang down onto the floor.  Have a firm chair that has side arms. You can use this for support while you get dressed.  Do not have throw rugs and other things on the floor  that can make you trip. What can I do in the kitchen?  Clean up any spills right away.  Avoid walking on wet floors.  Keep items that you use a lot in easy-to-reach places.  If you need to reach something above you, use a strong step stool that has a grab bar.  Keep electrical cords out of the way.  Do not use floor polish or wax that makes floors slippery. If you must use wax, use non-skid floor wax.  Do not have throw rugs and other things on the floor that can make you trip. What can I do with my stairs?  Do not leave any items on the stairs.  Make sure that there are handrails on both sides of the stairs and use them. Fix handrails that are broken or loose. Make sure that handrails are as long as the stairways.  Check any carpeting to make sure that it is firmly attached to the stairs. Fix any carpet that is loose or worn.  Avoid having throw rugs at the top or bottom of the stairs. If you do have throw rugs, attach them to the floor with carpet tape.  Make sure that you have a light switch at the top of the stairs  and the bottom of the stairs. If you do not have them, ask someone to add them for you. What else can I do to help prevent falls?  Wear shoes that:  Do not have high heels.  Have rubber bottoms.  Are comfortable and fit you well.  Are closed at the toe. Do not wear sandals.  If you use a stepladder:  Make sure that it is fully opened. Do not climb a closed stepladder.  Make sure that both sides of the stepladder are locked into place.  Ask someone to hold it for you, if possible.  Clearly mark and make sure that you can see:  Any grab bars or handrails.  First and last steps.  Where the edge of each step is.  Use tools that help you move around (mobility aids) if they are needed. These include:  Canes.  Walkers.  Scooters.  Crutches.  Turn on the lights when you go into a dark area. Replace any light bulbs as soon as they burn  out.  Set up your furniture so you have a clear path. Avoid moving your furniture around.  If any of your floors are uneven, fix them.  If there are any pets around you, be aware of where they are.  Review your medicines with your doctor. Some medicines can make you feel dizzy. This can increase your chance of falling. Ask your doctor what other things that you can do to help prevent falls. This information is not intended to replace advice given to you by your health care provider. Make sure you discuss any questions you have with your health care provider. Document Released: 03/24/2009 Document Revised: 11/03/2015 Document Reviewed: 07/02/2014 Elsevier Interactive Patient Education  2017 [place] Maintenance, Female Adopting a healthy lifestyle and getting preventive care can go a long way to promote health and wellness. Talk with your health care provider about what schedule of regular examinations is right for you. This is a good chance for you to check in with your provider about disease prevention and staying healthy. In between checkups, there are plenty of things you can do on your own. Experts have done a lot of research about which lifestyle changes and preventive measures are most likely to keep you healthy. Ask your health care provider for more information. Weight and diet Eat a healthy diet  Be sure to include plenty of vegetables, fruits, low-fat dairy products, and lean protein.  Do not eat a lot of foods high in solid fats, added sugars, or salt.  Get regular exercise. This is one of the most important things you can do for your health.  Most adults should exercise for at least 150 minutes each week. The exercise should increase your heart rate and make you sweat (moderate-intensity exercise).  Most adults should also do strengthening exercises at least twice a week. This is in addition to the moderate-intensity exercise. Maintain a healthy weight  Body  mass index (BMI) is a measurement that can be used to identify possible weight problems. It estimates body fat based on height and weight. Your health care provider can help determine your BMI and help you achieve or maintain a healthy weight.  For females 42 years of age and older:  A BMI below 18.5 is considered underweight.  A BMI of 18.5 to 24.9 is normal.  A BMI of 25 to 29.9 is considered overweight.  A BMI of 30 and above is considered obese. Watch levels of cholesterol and  blood lipids  You should start having your blood tested for lipids and cholesterol at 59 years of age, then have this test every 5 years.  You may need to have your cholesterol levels checked more often if:  Your lipid or cholesterol levels are high.  You are older than 59 years of age.  You are at high risk for heart disease. Cancer screening Lung Cancer  Lung cancer screening is recommended for adults 28-54 years old who are at high risk for lung cancer because of a history of smoking.  A yearly low-dose CT scan of the lungs is recommended for people who:  Currently smoke.  Have quit within the past 15 years.  Have at least a 30-pack-year history of smoking. A pack year is smoking an average of one pack of cigarettes a day for 1 year.  Yearly screening should continue until it has been 15 years since you quit.  Yearly screening should stop if you develop a health problem that would prevent you from having lung cancer treatment. Breast Cancer  Practice breast self-awareness. This means understanding how your breasts normally appear and feel.  It also means doing regular breast self-exams. Let your health care provider know about any changes, no matter how small.  If you are in your 20s or 30s, you should have a clinical breast exam (CBE) by a health care provider every 1-3 years as part of a regular health exam.  If you are 77 or older, have a CBE every year. Also consider having a breast  X-ray (mammogram) every year.  If you have a family history of breast cancer, talk to your health care provider about genetic screening.  If you are at high risk for breast cancer, talk to your health care provider about having an MRI and a mammogram every year.  Breast cancer gene (BRCA) assessment is recommended for women who have family members with BRCA-related cancers. BRCA-related cancers include:  Breast.  Ovarian.  Tubal.  Peritoneal cancers.  Results of the assessment will determine the need for genetic counseling and BRCA1 and BRCA2 testing. Cervical Cancer  Your health care provider may recommend that you be screened regularly for cancer of the pelvic organs (ovaries, uterus, and vagina). This screening involves a pelvic examination, including checking for microscopic changes to the surface of your cervix (Pap test). You may be encouraged to have this screening done every 3 years, beginning at age 64.  For women ages 13-65, health care providers may recommend pelvic exams and Pap testing every 3 years, or they may recommend the Pap and pelvic exam, combined with testing for human papilloma virus (HPV), every 5 years. Some types of HPV increase your risk of cervical cancer. Testing for HPV may also be done on women of any age with unclear Pap test results.  Other health care providers may not recommend any screening for nonpregnant women who are considered low risk for pelvic cancer and who do not have symptoms. Ask your health care provider if a screening pelvic exam is right for you.  If you have had past treatment for cervical cancer or a condition that could lead to cancer, you need Pap tests and screening for cancer for at least 20 years after your treatment. If Pap tests have been discontinued, your risk factors (such as having a new sexual partner) need to be reassessed to determine if screening should resume. Some women have medical problems that increase the chance of  getting cervical cancer. In  these cases, your health care provider may recommend more frequent screening and Pap tests. Colorectal Cancer  This type of cancer can be detected and often prevented.  Routine colorectal cancer screening usually begins at 59 years of age and continues through 59 years of age.  Your health care provider may recommend screening at an earlier age if you have risk factors for colon cancer.  Your health care provider may also recommend using home test kits to check for hidden blood in the stool.  A small camera at the end of a tube can be used to examine your colon directly (sigmoidoscopy or colonoscopy). This is done to check for the earliest forms of colorectal cancer.  Routine screening usually begins at age 66.  Direct examination of the colon should be repeated every 5-10 years through 59 years of age. However, you may need to be screened more often if early forms of precancerous polyps or small growths are found. Skin Cancer  Check your skin from head to toe regularly.  Tell your health care provider about any new moles or changes in moles, especially if there is a change in a mole's shape or color.  Also tell your health care provider if you have a mole that is larger than the size of a pencil eraser.  Always use sunscreen. Apply sunscreen liberally and repeatedly throughout the day.  Protect yourself by wearing long sleeves, pants, a wide-brimmed hat, and sunglasses whenever you are outside. Heart disease, diabetes, and high blood pressure  High blood pressure causes heart disease and increases the risk of stroke. High blood pressure is more likely to develop in:  People who have blood pressure in the high end of the normal range (130-139/85-89 mm Hg).  People who are overweight or obese.  People who are African American.  If you are 84-76 years of age, have your blood pressure checked every 3-5 years. If you are 89 years of age or older, have your  blood pressure checked every year. You should have your blood pressure measured twice-once when you are at a hospital or clinic, and once when you are not at a hospital or clinic. Record the average of the two measurements. To check your blood pressure when you are not at a hospital or clinic, you can use:  An automated blood pressure machine at a pharmacy.  A home blood pressure monitor.  If you are between 47 years and 33 years old, ask your health care provider if you should take aspirin to prevent strokes.  Have regular diabetes screenings. This involves taking a blood sample to check your fasting blood sugar level.  If you are at a normal weight and have a low risk for diabetes, have this test once every three years after 59 years of age.  If you are overweight and have a high risk for diabetes, consider being tested at a younger age or more often. Preventing infection Hepatitis B  If you have a higher risk for hepatitis B, you should be screened for this virus. You are considered at high risk for hepatitis B if:  You were born in a country where hepatitis B is common. Ask your health care provider which countries are considered high risk.  Your parents were born in a high-risk country, and you have not been immunized against hepatitis B (hepatitis B vaccine).  You have HIV or AIDS.  You use needles to inject street drugs.  You live with someone who has hepatitis B.  You have had sex with someone who has hepatitis B.  You get hemodialysis treatment.  You take certain medicines for conditions, including cancer, organ transplantation, and autoimmune conditions. Hepatitis C  Blood testing is recommended for:  Everyone born from [birth year] through 1963.  Anyone with known risk factors for hepatitis C. Sexually transmitted infections (STIs)  You should be screened for sexually transmitted infections (STIs) including gonorrhea and chlamydia if:  You are sexually active and are  younger than 59 years of age.  You are older than 59 years of age and your health care provider tells you that you are at risk for this type of infection.  Your sexual activity has changed since you were last screened and you are at an increased risk for chlamydia or gonorrhea. Ask your health care provider if you are at risk.  If you do not have HIV, but are at risk, it may be recommended that you take a prescription medicine daily to prevent HIV infection. This is called pre-exposure prophylaxis (PrEP). You are considered at risk if:  You are sexually active and do not regularly use condoms or know the HIV status of your partner(s).  You take drugs by injection.  You are sexually active with a partner who has HIV. Talk with your health care provider about whether you are at high risk of being infected with HIV. If you choose to begin PrEP, you should first be tested for HIV. You should then be tested every 3 months for as long as you are taking PrEP. Pregnancy  If you are premenopausal and you may become pregnant, ask your health care provider about preconception counseling.  If you may become pregnant, take 400 to 800 micrograms (mcg) of folic acid every day.  If you want to prevent pregnancy, talk to your health care provider about birth control (contraception). Osteoporosis and menopause  Osteoporosis is a disease in which the bones lose minerals and strength with aging. This can result in serious bone fractures. Your risk for osteoporosis can be identified using a bone density scan.  If you are 67 years of age or older, or if you are at risk for osteoporosis and fractures, ask your health care provider if you should be screened.  Ask your health care provider whether you should take a calcium or vitamin D supplement to lower your risk for osteoporosis.  Menopause may have certain physical symptoms and risks.  Hormone replacement therapy may reduce some of these symptoms and  risks. Talk to your health care provider about whether hormone replacement therapy is right for you. Follow these instructions at home:  Schedule regular health, dental, and eye exams.  Stay current with your immunizations.  Do not use any tobacco products including cigarettes, chewing tobacco, or electronic cigarettes.  If you are pregnant, do not drink alcohol.  If you are breastfeeding, limit how much and how often you drink alcohol.  Limit alcohol intake to no more than 1 drink per day for nonpregnant women. One drink equals 12 ounces of beer, 5 ounces of wine, or 1 ounces of hard liquor.  Do not use street drugs.  Do not share needles.  Ask your health care provider for help if you need support or information about quitting drugs.  Tell your health care provider if you often feel depressed.  Tell your health care provider if you have ever been abused or do not feel safe at home. This information is not intended to replace advice given to  you by your health care provider. Make sure you discuss any questions you have with your health care provider. Document Released: 12/11/2010 Document Revised: 11/03/2015 Document Reviewed: 03/01/2015 Elsevier Interactive Patient Education  2017 [employer].

## 2016-09-10 NOTE — Telephone Encounter (Signed)
Needs 3 month supply

## 2016-09-10 NOTE — Assessment & Plan Note (Signed)
USPSTF grade A and B recommendations reviewed with patient; age-appropriate recommendations, preventive care, screening tests, etc discussed and encouraged; healthy living encouraged; see AVS for patient education given to patient  

## 2016-09-10 NOTE — Progress Notes (Signed)
Patient ID: Shirley Blackwell, female   DOB: 09/07/1955, 61 y.o.   MRN: [id number]   Subjective:   Shirley Blackwell is a 61 y.o. female here for a complete physical exam  Interim issues since last visit: no medical excitement  USPSTF grade A and B recommendations Depression:  Depression screen PHQ 2/9 09/10/2016 06/07/2015 12/06/2014  Decreased Interest 0 0 0  Down, Depressed, Hopeless 0 0 0  PHQ - 2 Score 0 0 0   Hypertension: BP Readings from Last 3 Encounters:  09/10/16 114/68  02/17/16 121/79  11/17/15 104/62   Obesity: she has lost down to 181 pounds; her eating habits have just changed; not as hungry, portion control Wt Readings from Last 3 Encounters:  09/10/16 186 lb 1.6 oz (84.4 kg)  02/17/16 195 lb 9.6 oz (88.7 kg)  11/17/15 196 lb (88.9 kg)   BMI Readings from Last 3 Encounters:  09/10/16 28.72 kg/m  02/17/16 30.18 kg/m  11/17/15 30.24 kg/m    Alcohol: no Tobacco use: nonsmoker HIV, hep B, hep C: declined, 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 Sankar, 10/28/15, managed and seeing them every year; stopped taking her medicine after 5 years No results found for: HMMAMMO <-- incorrectly link BRCA gene screening: had it done, negative Cervical cancer screening: today; no abnormal paps ever Osteoporosis: has one scheduled No results found for: HMDEXASCAN  Fall prevention/vitamin D: not taking vit D; discussed fall prevention Lipids: fasting today; runs in the family with reactions to statin Lab Results  Component Value Date   CHOL 208 (H) 12/14/2014   Lab Results  Component Value Date   HDL 47 12/14/2014   Lab Results  Component Value Date   LDLCALC 138 (H) 12/14/2014   Lab Results  Component Value Date   TRIG 113 12/14/2014   Lab Results  Component Value Date   CHOLHDL 4.4  12/14/2014   No results found for: LDLDIRECT  Glucose: fasting today Glucose  Date Value Ref Range Status  12/14/2014 103 (H) 65 - 99 mg/dL Final  09/07/2014 113 (H) mg/dL Final    Comment:    65-99 NOTE: New Reference Range  08/17/14   02/04/2013 102 (H) 65 - 99 mg/dL Final  12/22/2012 97 65 - 99 mg/dL Final   Glucose, Bld  Date Value Ref Range Status  02/17/2016 132 (H) 65 - 99 mg/dL Final  11/16/2014 134 (H) 65 - 99 mg/dL Final   Colorectal cancer: 2014; may need to be 5 years; had 14 inches of colon removed Lung cancer:  nonsmoker AAA: n/a Aspirin: check 10 year ASCVD risk; takes with flights Diet: smaller portions Exercise: does walk in spring, summer, and fall Skin cancer: has a dermatologist, mole on lower abdomen, 50 cent piece, they said not cancerous   Past Medical History:  Diagnosis Date  . Breast cancer (HCC)    Right breast- chemo  . 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 Cancer of right breast (HCC)    right mastectomy invasive lobular CA,   . Colon cancer (HCC)    Pre cancerous polyps  . Hepatitis   . Hx of lumpectomy 2012   right  . Hypertension 1999  . Personal history of malignant neoplasm of breast 2012   R Mastectomy,  .   Shingles    Past Surgical History:  Procedure Laterality Date  . BREAST BIOPSY Left    core bx- neg  . COLECTOMY Right 2013  . COLONOSCOPY  2013, 2014   Dr. Jamal Collin  . KNEE SURGERY Left 2014  . MASTECTOMY Right 2012   invasive lobular CA,   Family History  Problem Relation Age of Onset  . Cancer Sister     Theadora Rama  . Diabetes Mother   . Hypertension Mother   . Kidney disease Mother   . Hypertension Father   . Diabetes Maternal Grandmother   . Heart failure Maternal Grandmother   . Heart failure Paternal Grandfather   . Breast cancer Neg Hx    Social History  Substance Use Topics  . Smoking status: Never Smoker  . Smokeless tobacco: Never Used  . Alcohol  use No   Review of Systems  Objective:   Vitals:   09/10/16 0832  BP: 114/68  Pulse: 88  Resp: 14  Temp: 97.7 F (36.5 C)  TempSrc: Oral  SpO2: 94%  Weight: 186 lb 1.6 oz (84.4 kg)   Body mass index is 28.72 kg/m. Wt Readings from Last 3 Encounters:  09/10/16 186 lb 1.6 oz (84.4 kg)  02/17/16 195 lb 9.6 oz (88.7 kg)  11/17/15 196 lb (88.9 kg)   Physical Exam  Constitutional: She appears well-developed and well-nourished.  HENT:  Head: Normocephalic and atraumatic.  Eyes: Conjunctivae and EOM are normal. Right eye exhibits no hordeolum. Left eye exhibits no hordeolum. No scleral icterus.  Neck: Carotid bruit is not present. No thyromegaly present.  Cardiovascular: Normal rate, regular rhythm, S1 normal, S2 normal and normal heart sounds.   No extrasystoles are present.  Pulmonary/Chest: Effort normal and breath sounds normal. No respiratory distress.  Status post right mastectomy; breast exam deferred to Dr. Jamal Collin, surgeon  Abdominal: Soft. Normal appearance and bowel sounds are normal. She exhibits no distension, no abdominal bruit, no pulsatile midline mass and no mass. There is no hepatosplenomegaly. There is no tenderness. No hernia.  Genitourinary: Uterus normal. Pelvic exam was performed with patient prone. There is no rash or lesion on the right labia. There is no rash or lesion on the left labia. Cervix exhibits no motion tenderness. Right adnexum displays no mass, no tenderness and no fullness. Left adnexum displays no mass, no tenderness and no fullness.  Genitourinary Comments: Difficult to see cervix, but part that was visualized appeared glandular, not sure if nabothian cysts, friable  Musculoskeletal: Normal range of motion. She exhibits no edema.  Lymphadenopathy:       Head (right side): No submandibular adenopathy present.       Head (left side): No submandibular adenopathy present.    She has no cervical adenopathy.    She has no axillary adenopathy.   Neurological: She is alert. She displays no tremor. No cranial nerve deficit. She exhibits normal muscle tone. Gait normal.  Skin: Skin is warm and dry. No bruising and no ecchymosis noted. No cyanosis. No pallor.  Psychiatric: Her speech is normal and behavior is normal. Thought content normal. Her mood appears not anxious. She does not exhibit a depressed mood.    Assessment/Plan:   Problem List Items Addressed This Visit      Other   Screening for malignant neoplasm of cervix    Collect pap smear today      Relevant Orders   Pap IG and HPV (high risk) DNA detection   Preventative health care - Primary  USPSTF grade A and B recommendations reviewed with patient; age-appropriate recommendations, preventive care, screening tests, etc discussed and encouraged; healthy living encouraged; see AVS for patient education given to patient       Relevant Orders   CBC with Differential/Platelet   COMPLETE METABOLIC PANEL WITH GFR   Lipid panel   TSH    Other Visit Diagnoses    Abnormal appearance of cervix       Relevant Orders   Ambulatory referral to Gynecology       No orders of the defined types were placed in this encounter.  Orders Placed This Encounter  Procedures  . CBC with Differential/Platelet  . COMPLETE METABOLIC PANEL WITH GFR  . Lipid panel  . TSH  . Ambulatory referral to Gynecology    Referral Priority:   Routine    Referral Type:   Consultation    Referral Reason:   Specialty Services Required    Requested Specialty:   Gynecology    Number of Visits Requested:   1    Follow up plan: Return in about 1 year (around 09/10/2017) for complete physical.  An After Visit Summary was printed and given to the patient.

## 2016-09-11 LAB — TSH: TSH: 3.84 m[IU]/L

## 2016-09-11 MED ORDER — AMLODIPINE BESY-BENAZEPRIL HCL 5-10 MG PO CAPS: 1.0000 | ORAL_CAPSULE | Freq: Every day | ORAL | 0 refills | Status: DC

## 2016-09-12 LAB — PAP IG AND HPV HIGH-RISK: HPV DNA High Risk: NOT DETECTED

## 2016-09-20 ENCOUNTER — Other Ambulatory Visit: Payer: Self-pay | Admitting: Family Medicine

## 2016-09-20 NOTE — Telephone Encounter (Signed)
Patient states has never seen another doctor for this she only got from Dr. Rutherford Nail and needs a refill 90 day supply

## 2016-09-20 NOTE — Telephone Encounter (Signed)
Pt needs refill on Triamteren to be sent to [facility] 90 day supply.

## 2016-09-21 NOTE — Telephone Encounter (Signed)
I truly do not see that we've ever prescribed this medicine for her I went through the medication history back to November 10, 2014 She used to be on triamterene/hctz which was last prescribed in 2016 and discontinued in May 2017 No other prescriptions for this type of medicine have been written I do NOT recommend that she take triamterene with the benazepril that she already takes We'll be glad to see her to discuss this and f/u on her BP and labs in a few weeks, but I'm not going to recommend a refill of this medicine Appt if desired for BP and discussion

## 2016-09-24 ENCOUNTER — Encounter: Payer: Self-pay | Admitting: Family Medicine

## 2016-09-24 ENCOUNTER — Ambulatory Visit (INDEPENDENT_AMBULATORY_CARE_PROVIDER_SITE_OTHER): Payer: 59 | Admitting: Family Medicine

## 2016-09-24 DIAGNOSIS — E782 Mixed hyperlipidemia: Secondary | ICD-10-CM | POA: Diagnosis not present

## 2016-09-24 DIAGNOSIS — R7303 Prediabetes: Secondary | ICD-10-CM | POA: Diagnosis not present

## 2016-09-24 DIAGNOSIS — I10 Essential (primary) hypertension: Secondary | ICD-10-CM

## 2016-09-24 MED ORDER — TRIAMTERENE-HCTZ 37.5-25 MG PO TABS: 0.5000 | ORAL_TABLET | Freq: Every day | ORAL | 3 refills | Status: DC

## 2016-09-24 NOTE — Assessment & Plan Note (Signed)
Going on for years; encouraged DASH guidelines; continue the same medicines she has been on for years; no changes apparently, refills provided; avoid black licorice, avoid decongestants, avoid salt, avoid NSAIDs when able

## 2016-09-24 NOTE — Patient Instructions (Addendum)
Try to use PLAIN allergy medicine without the decongestant Avoid: phenylephrine, phenylpropanolamine, and pseudoephredine Your goal blood pressure is less than 140 mmHg on top. Try to follow the DASH guidelines (DASH stands for Dietary Approaches to Stop Hypertension) Try to limit the sodium in your diet.  Ideally, consume less than 1.5 grams (less than 1,500mg ) per day. Do not add salt when cooking or at the table.  Check the sodium amount on labels when shopping, and choose items lower in sodium when given a choice. Avoid or limit foods that already contain a lot of sodium. Eat a diet rich in fruits and vegetables and whole grains.   Steps to Elicit the Relaxation Response The following is the technique reprinted with permission from Dr. Billie Ruddy book The Relaxation Response pages 162-163 1. Sit quietly in a comfortable position. 2. Close your eyes. 3. Deeply relax all your muscles,  beginning at your feet and progressing up to your face.  Keep them relaxed. 4. Breathe through your nose.  Become aware of your breathing.  As you breathe out, say the word, "one"*,  silently to yourself. For example,  breathe in ... out, "one",- in .. out, "one", etc.  Breathe easily and naturally. 5. Continue for 10 to 20 minutes.  You may open your eyes to check the time, but do not use an alarm.  When you finish, sit quietly for several minutes,  at first with your eyes closed and later with your eyes opened.  Do not stand up for a few minutes. 6. Do not worry about whether you are successful  in achieving a deep level of relaxation.  Maintain a passive attitude and permit relaxation to occur at its own pace.  When distracting thoughts occur,  try to ignore them by not dwelling upon them  and return to repeating "one."  With practice, the response should come with little effort.  Practice the technique once or twice daily,  but not within two hours after any meal,  since the digestive  processes seem to interfere with  the elicitation of the Relaxation Response. * It is better to use a soothing, mellifluous sound, preferably with no meaning. or association, to avoid stimulation of unnecessary thoughts - a mantra.  DASH Eating Plan DASH stands for "Dietary Approaches to Stop Hypertension." The DASH eating plan is a healthy eating plan that has been shown to reduce high blood pressure (hypertension). It may also reduce your risk for type 2 diabetes, heart disease, and stroke. The DASH eating plan may also help with weight loss. What are tips for following this plan? General guidelines   Avoid eating more than 2,300 mg (milligrams) of salt (sodium) a day. If you have hypertension, you may need to reduce your sodium intake to 1,500 mg a day.  Limit alcohol intake to no more than 1 drink a day for nonpregnant women and 2 drinks a day for men. One drink equals 12 oz of beer, 5 oz of wine, or 1 oz of hard liquor.  Work with your health care provider to maintain a healthy body weight or to lose weight. Ask what an ideal weight is for you.  Get at least 30 minutes of exercise that causes your heart to beat faster (aerobic exercise) most days of the week. Activities may include walking, swimming, or biking.  Work with your health care provider or diet and nutrition specialist (dietitian) to adjust your eating plan to your individual calorie needs. Reading food labels  Check food labels for the amount of sodium per serving. Choose foods with less than 5 percent of the Daily Value of sodium. Generally, foods with less than 300 mg of sodium per serving fit into this eating plan.  To find whole grains, look for the word "whole" as the first word in the ingredient list. Shopping   Buy products labeled as "low-sodium" or "no salt added."  Buy fresh foods. Avoid canned foods and premade or frozen meals. Cooking   Avoid adding salt when cooking. Use salt-free seasonings or herbs  instead of table salt or sea salt. Check with your health care provider or pharmacist before using salt substitutes.  Do not fry foods. Cook foods using healthy methods such as baking, boiling, grilling, and broiling instead.  Cook with heart-healthy oils, such as olive, canola, soybean, or sunflower oil. Meal planning    Eat a balanced diet that includes:  5 or more servings of fruits and vegetables each day. At each meal, try to fill half of your plate with fruits and vegetables.  Up to 6-8 servings of whole grains each day.  Less than 6 oz of lean meat, poultry, or fish each day. A 3-oz serving of meat is about the same size as a deck of cards. One egg equals 1 oz.  2 servings of low-fat dairy each day.  A serving of nuts, seeds, or beans 5 times each week.  Heart-healthy fats. Healthy fats called Omega-3 fatty acids are found in foods such as flaxseeds and coldwater fish, like sardines, salmon, and mackerel.  Limit how much you eat of the following:  Canned or prepackaged foods.  Food that is high in trans fat, such as fried foods.  Food that is high in saturated fat, such as fatty meat.  Sweets, desserts, sugary drinks, and other foods with added sugar.  Full-fat dairy products.  Do not salt foods before eating.  Try to eat at least 2 vegetarian meals each week.  Eat more home-cooked food and less restaurant, buffet, and fast food.  When eating at a restaurant, ask that your food be prepared with less salt or no salt, if possible. What foods are recommended? The items listed may not be a complete list. Talk with your dietitian about what dietary choices are best for you. Grains  Whole-grain or whole-wheat bread. Whole-grain or whole-wheat pasta. Brown rice. [place] Morrow. Bulgur. Whole-grain and low-sodium cereals. Pita bread. Low-fat, low-sodium crackers. Whole-wheat flour tortillas. Vegetables  Fresh or frozen vegetables (raw, steamed, roasted, or grilled).  Low-sodium or reduced-sodium tomato and vegetable juice. Low-sodium or reduced-sodium tomato sauce and tomato paste. Low-sodium or reduced-sodium canned vegetables. Fruits  All fresh, dried, or frozen fruit. Canned fruit in natural juice (without added sugar). Meat and other protein foods  Skinless chicken or [place]. Ground chicken or [place]. Pork with fat trimmed off. Fish and seafood. Egg whites. Dried beans, peas, or lentils. Unsalted nuts, nut butters, and seeds. Unsalted canned beans. Lean cuts of beef with fat trimmed off. Low-sodium, lean deli meat. Dairy  Low-fat (1%) or fat-free (skim) milk. Fat-free, low-fat, or reduced-fat cheeses. Nonfat, low-sodium ricotta or cottage cheese. Low-fat or nonfat yogurt. Low-fat, low-sodium cheese. Fats and oils  Soft margarine without trans fats. Vegetable oil. Low-fat, reduced-fat, or light mayonnaise and salad dressings (reduced-sodium). Canola, safflower, olive, soybean, and sunflower oils. Avocado. Seasoning and other foods  Herbs. Spices. Seasoning mixes without salt. Unsalted popcorn and pretzels. Fat-free sweets. What foods are not recommended? The items listed  may not be a complete list. Talk with your dietitian about what dietary choices are best for you. Grains  Baked goods made with fat, such as croissants, muffins, or some breads. Dry pasta or rice meal packs. Vegetables  Creamed or fried vegetables. Vegetables in a cheese sauce. Regular canned vegetables (not low-sodium or reduced-sodium). Regular canned tomato sauce and paste (not low-sodium or reduced-sodium). Regular tomato and vegetable juice (not low-sodium or reduced-sodium). Angie Fava. Olives. Fruits  Canned fruit in a light or heavy syrup. Fried fruit. Fruit in cream or butter sauce. Meat and other protein foods  Fatty cuts of meat. Ribs. Fried meat. Berniece Salines. Sausage. Bologna and other processed lunch meats. Salami. Fatback. Hotdogs. Bratwurst. Salted nuts and seeds. Canned beans with  added salt. Canned or smoked fish. Whole eggs or egg yolks. Chicken or [place] with skin. Dairy  Whole or 2% milk, cream, and half-and-half. Whole or full-fat cream cheese. Whole-fat or sweetened yogurt. Full-fat cheese. Nondairy creamers. Whipped toppings. Processed cheese and cheese spreads. Fats and oils  Butter. Stick margarine. Lard. Shortening. Ghee. Bacon fat. Tropical oils, such as coconut, palm kernel, or palm oil. Seasoning and other foods  Salted popcorn and pretzels. Onion salt, garlic salt, seasoned salt, table salt, and sea salt. Worcestershire sauce. Tartar sauce. Barbecue sauce. Teriyaki sauce. Soy sauce, including reduced-sodium. Steak sauce. Canned and packaged gravies. Fish sauce. Oyster sauce. Cocktail sauce. Horseradish that you find on the shelf. Ketchup. Mustard. Meat flavorings and tenderizers. Bouillon cubes. Hot sauce and Tabasco sauce. Premade or packaged marinades. Premade or packaged taco seasonings. Relishes. Regular salad dressings. Where to find more information:  National Heart, Lung, and [place]: https://wilson-eaton.com/  American Heart Association: www.heart.org Summary  The DASH eating plan is a healthy eating plan that has been shown to reduce high blood pressure (hypertension). It may also reduce your risk for type 2 diabetes, heart disease, and stroke.  With the DASH eating plan, you should limit salt (sodium) intake to 2,300 mg a day. If you have hypertension, you may need to reduce your sodium intake to 1,500 mg a day.  When on the DASH eating plan, aim to eat more fresh fruits and vegetables, whole grains, lean proteins, low-fat dairy, and heart-healthy fats.  Work with your health care provider or diet and nutrition specialist (dietitian) to adjust your eating plan to your individual calorie needs. This information is not intended to replace advice given to you by your health care provider. Make sure you discuss any questions you have with your health  care provider. Document Released: 05/17/2011 Document Revised: 05/21/2016 Document Reviewed: 05/21/2016 Elsevier Interactive Patient Education  2017 [employer].

## 2016-09-24 NOTE — Telephone Encounter (Signed)
Left detailed voicemail

## 2016-09-24 NOTE — Assessment & Plan Note (Signed)
Work on healthy eating; check A1c periodically; weight loss

## 2016-09-24 NOTE — Assessment & Plan Note (Signed)
Patient does not want to take a statin; she'll try dietary changes and recheck in 3 months

## 2016-09-24 NOTE — Progress Notes (Signed)
BP 124/80   Pulse 85   Temp 98.2 F (36.8 C) (Oral)   Resp 14   Wt 186 lb (84.4 kg)   SpO2 94%   BMI 28.70 kg/m    Subjective:    Patient ID: Shirley Blackwell, female    DOB: [birth year], 61 y.o.   MRN: [id number]  HPI: Shirley Blackwell is a 61 y.o. female  Chief Complaint  Patient presents with  . Medication Refill    for the bp/fluid pill    HPI Patient is here for chronic health issues She takes triamterene/hctz 37.5/25 and has the bottle with her She has been on this medicine for 15-18 years It has been so low once in the past that they had to hold off on surgery She takes potassium pills that she just takes a few days a month when she feels legs ache, just a few times a month If she wakes up with aching legs, that is her trigger When she had labs drawn on 09/10/16 Previous labs reviewed She says the fluid in her legs takes care of the fluid in her legs Last excellent BP was on the half pill triam/hctz Mother and father both have HTN She stays away from excessive salttiny bit Knows to stay away from licorice She has stress and that affects her blood pressure Her hormone levels were like she was menstruating at the time with her breast cancer; going to see GYN soon for abnormal; LMP Dec 2007; stopped periods at 7; sister had hysterectomy for cancer High cholesterol; whole family is intolerant to statins; LDL 138; patient is adamant about not taking any statins; she is going to eat more oatmeal and avocado Prediabetes; previous lab not fasting; most recent 106 was truly fasting; she knows to watch her weight; positive in family  Depression screen [facility] 2/9 09/24/2016 09/10/2016 06/07/2015 12/06/2014  Decreased Interest 0 0 0 0  Down, Depressed, Hopeless 0 0 0 0  PHQ - 2 Score 0 0 0 0   Relevant past medical, surgical, family and social history reviewed Past Medical History:  Diagnosis Date  . Breast cancer ([place])    Right breast- chemo  . 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 Cancer of right breast ([place])    right mastectomy invasive lobular CA,   . Colon cancer ([place])    Pre cancerous polyps  . Hepatitis   . Hx of lumpectomy 2012   right  . Hypertension 1999  . Personal history of malignant neoplasm of breast 2012   R Mastectomy,  . Prediabetes 12/06/2014  . Shingles    Past Surgical History:  Procedure Laterality Date  . BREAST BIOPSY Left    core bx- neg  . COLECTOMY Right 2013  . COLONOSCOPY  2013, 2014   Dr. Jamal Collin  . KNEE SURGERY Left 2014  . MASTECTOMY Right 2012   invasive lobular CA,   Family History  Problem Relation Age of Onset  . Cancer Sister     Theadora Rama  . Diabetes Mother   . Hypertension Mother   . Kidney disease Mother   . Hypertension Father   . Diabetes Maternal Grandmother   . Heart failure Maternal Grandmother   . Heart failure Paternal Grandfather   . Breast cancer Neg Hx    Social History  Substance Use Topics  . Smoking status: Never Smoker  . Smokeless tobacco: Never Used  . Alcohol use No  Interim medical history since last visit reviewed. Allergies and medications reviewed  Review of Systems Per HPI unless specifically indicated above     Objective:    BP 124/80   Pulse 85   Temp 98.2 F (36.8 C) (Oral)   Resp 14   Wt 186 lb (84.4 kg)   SpO2 94%   BMI 28.70 kg/m   Wt Readings from Last 3 Encounters:  09/24/16 186 lb (84.4 kg)  09/10/16 186 lb 1.6 oz (84.4 kg)  02/17/16 195 lb 9.6 oz (88.7 kg)    Physical Exam  Constitutional: She appears well-developed and well-nourished. No distress.  HENT:  Head: Normocephalic and atraumatic.  Eyes: EOM are normal. No scleral icterus.  Neck: No thyromegaly present.  Cardiovascular: Normal rate and regular rhythm.   Pulmonary/Chest: Effort normal and breath sounds normal.  Abdominal: Soft. Bowel sounds are normal. She exhibits no distension.  Musculoskeletal: Normal range of motion. She exhibits no  edema.  Neurological: She is alert. She exhibits normal muscle tone.  Skin: Skin is warm and dry. She is not diaphoretic. No pallor.  Psychiatric: She has a normal mood and affect.    Results for orders placed or performed in visit on 09/10/16  Pap IG and HPV (high risk) DNA detection  Result Value Ref Range   HPV DNA High Risk Not Detected    Specimen adequacy:     FINAL DIAGNOSIS:     COMMENTS:     Cytotechnologist:        Assessment & Plan:   Problem List Items Addressed This Visit      Cardiovascular and Mediastinum   Benign essential HTN    Going on for years; encouraged DASH guidelines; continue the same medicines she has been on for years; no changes apparently, refills provided; avoid black licorice, avoid decongestants, avoid salt, avoid NSAIDs when able      Relevant Medications   triamterene-hydrochlorothiazide (MAXZIDE-25) 37.5-25 MG tablet     Other   Prediabetes    Work on healthy eating; check A1c periodically; weight loss      HLD (hyperlipidemia)    Patient does not want to take a statin; she'll try dietary changes and recheck in 3 months      Relevant Medications   triamterene-hydrochlorothiazide (MAXZIDE-25) 37.5-25 MG tablet       Follow up plan: Return in about 3 months (around 12/24/2016) for fasting labs only.  An after-visit summary was printed and given to the patient at [facility].  Please see the patient instructions which may contain other information and recommendations beyond what is mentioned above in the assessment and plan.  Meds ordered this encounter  Medications  . DISCONTD: triamterene-hydrochlorothiazide (MAXZIDE-25) 37.5-25 MG tablet    Sig: Take 0.5 tablets by mouth daily.  Marland Kitchen triamterene-hydrochlorothiazide (MAXZIDE-25) 37.5-25 MG tablet    Sig: Take 0.5 tablets by mouth daily.    Dispense:  45 tablet    Refill:  3    No orders of the defined types were placed in this encounter.

## 2016-10-03 ENCOUNTER — Ambulatory Visit (INDEPENDENT_AMBULATORY_CARE_PROVIDER_SITE_OTHER): Payer: 59 | Admitting: Obstetrics and Gynecology

## 2016-10-03 ENCOUNTER — Encounter: Payer: Self-pay | Admitting: Obstetrics and Gynecology

## 2016-10-03 VITALS — BP 128/76 | Ht 67.0 in | Wt 190.0 lb

## 2016-10-03 DIAGNOSIS — N889 Noninflammatory disorder of cervix uteri, unspecified: Secondary | ICD-10-CM

## 2016-10-03 NOTE — Progress Notes (Signed)
Obstetrics & Gynecology Office Visit   Chief Complaint  Patient presents with  . Referral Appointment    pt was told by her PCP that her cervix looked "abnormal"  Referral from Dr. Enid Derry of [facility]  History of Present Illness: 61 y.o. G47P8 female who presents in referral from Dr. Enid Derry, at [facility], for an abnormal-appearing cervix. She states that she has seen Dr. Sanda Klein for the first time recently and was previously followed by another PCP.  This prior PCP used to "pop" cysts on her cervix whenever she would have an appointment.  She notably had a recent pap smear reflecting normal cytology with HPV negative.  She underwent the menopausal transition about 11 years ago. She has had no postmenopausal bleeding. She has no symptoms of menopause. She has a medical history significant for hormone-positive breast cancer, diagnosed in 2012. For this she underwent gene mutation screening that was all negative.   She has no acute complaints today.   Review of Systems: Review of Systems  Constitutional: Negative.   HENT: Negative.   Eyes: Negative.   Respiratory: Negative.   Cardiovascular: Negative.   Gastrointestinal: Negative.   Genitourinary: Negative.   Musculoskeletal: Negative.   Skin: Negative.   Neurological: Negative.   Psychiatric/Behavioral: Negative.     Past Medical History:  Diagnosis Date  . Breast cancer ([place])    Right breast- chemo  . 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 Cancer of right breast ([place])    right mastectomy invasive lobular CA,   . Colon cancer ([place])    Pre cancerous polyps  . Hepatitis   . Hx of lumpectomy 2012   right  . Hypertension 1999  . Personal history of malignant neoplasm of breast 2012   R Mastectomy,  . Prediabetes 12/06/2014  . Shingles     Past Surgical History:  Procedure Laterality Date  . BREAST BIOPSY Left    core bx- neg  .  COLECTOMY Right 2013  . COLONOSCOPY  2013, 2014   Dr. Jamal Collin  . KNEE SURGERY Left 2014  . MASTECTOMY Right 2012   invasive lobular CA,    Gynecologic History: No LMP recorded. Patient is postmenopausal.  Obstetric History: [place]  Family History  Problem Relation Age of Onset  . Cancer Sister     Theadora Rama  . Diabetes Mother   . Hypertension Mother   . Kidney disease Mother   . Hypertension Father   . Diabetes Maternal Grandmother   . Heart failure Maternal Grandmother   . Heart failure Paternal Grandfather   . Breast cancer Neg Hx     Social History   Social History  . Marital status: Married    Spouse name: N/A  . Number of children: N/A  . Years of education: N/A   Occupational History  . Not on file.   Social History Main Topics  . Smoking status: Never Smoker  . Smokeless tobacco: Never Used  . Alcohol use No  . Drug use: No  . Sexual activity: Not on file   Other Topics Concern  . Not on file   Social History Narrative  . No narrative on file    Allergies  Allergen Reactions  . Sulfa Antibiotics Hives    Other reaction(s): Hives  . Tape Other (See Comments)    blisters    Medications:   Medication Sig Start Date End Date Taking? Authorizing Provider  amLODipine-benazepril (LOTREL) 5-10 MG capsule Take 1 capsule by mouth daily. 09/11/16  Yes Arnetha Courser, MD  Potassium Chloride ER 20 MEQ TBCR Take 20 mEq by mouth daily. Patient taking differently: Take 20 mEq by mouth as needed.  06/07/15  Yes Ashok Norris, MD  triamterene-hydrochlorothiazide (MAXZIDE-25) 37.5-25 MG tablet Take 0.5 tablets by mouth daily. 09/24/16  Yes Arnetha Courser, MD    Physical Exam BP 128/76   Ht 5\' 7"  (1.702 m)   Wt 190 lb (86.2 kg)   BMI 29.76 kg/m  No LMP recorded. Patient is postmenopausal. Physical Exam  Constitutional: She is oriented to person, place, and time and well-developed, well-nourished, and in no distress. No distress.  HENT:  Head: Normocephalic  and atraumatic.  Eyes: Conjunctivae are normal. No scleral icterus.  Neck: Normal range of motion. Neck supple. No thyromegaly present.  Cardiovascular: Normal rate and regular rhythm.   Pulmonary/Chest: Effort normal and breath sounds normal.  Abdominal: Soft. Bowel sounds are normal. She exhibits no distension and no mass. There is no tenderness. There is no rebound and no guarding.  Genitourinary: Vagina normal, uterus normal, right adnexa normal and left adnexa normal. Uterus is not deviated, not enlarged, not fixed and not tender. Right adnexum displays no mass and no tenderness. Left adnexum displays no mass and no tenderness.  Genitourinary Comments: Left vulva with several, clustered cystic nodules that are non-tender with no surrounding erythema.  Cervix: notable for visibility of SCG/TZ, no other abnormal lesions.  Non-tender. No bleeding, non-friable  Musculoskeletal: Normal range of motion. She exhibits no edema.  Lymphadenopathy:    She has no cervical adenopathy.  Neurological: She is alert and oriented to person, place, and time. No cranial nerve deficit.  Psychiatric: Mood, affect and judgment normal.    Female chaperone present for pelvic and breast  portions of the physical exam  Assessment: 61 y.o. G8P8 here in referral for abnormal cervix exam at her PCP.  Her cervix appears normal on exam today. She has a very parous-appearing cervix. There are no concerning findings.    Plan: Abnormal appearance of cervix - No concerning findings today. Continue routine screening.   Continue routine pelvic-health exams and cervical cancer screening per guideline recommendations.  Prentice Docker, MD 10/03/2016 3:35 PM    CC: Arnetha Courser, MD [address] [place], [place] [address]

## 2016-10-17 ENCOUNTER — Ambulatory Visit
Admission: RE | Admit: 2016-10-17 | Discharge: 2016-10-17 | Disposition: A | Discharge status: Home / Self Care | Payer: Managed Care, Other (non HMO) | Source: Ambulatory Visit | Attending: Oncology | Admitting: Oncology

## 2016-10-17 DIAGNOSIS — C50411 Malignant neoplasm of upper-outer quadrant of right female breast: Secondary | ICD-10-CM | POA: Diagnosis present

## 2016-10-17 DIAGNOSIS — M8589 Other specified disorders of bone density and structure, multiple sites: Secondary | ICD-10-CM | POA: Diagnosis not present

## 2016-10-17 DIAGNOSIS — Z79811 Long term (current) use of aromatase inhibitors: Secondary | ICD-10-CM | POA: Diagnosis not present

## 2016-10-29 ENCOUNTER — Ambulatory Visit
Admission: RE | Admit: 2016-10-29 | Discharge: 2016-10-29 | Disposition: A | Discharge status: Home / Self Care | Payer: BLUE CROSS/BLUE SHIELD | Source: Ambulatory Visit | Attending: General Surgery | Admitting: General Surgery

## 2016-10-29 DIAGNOSIS — Z1231 Encounter for screening mammogram for malignant neoplasm of breast: Secondary | ICD-10-CM | POA: Diagnosis not present

## 2016-10-29 DIAGNOSIS — R921 Mammographic calcification found on diagnostic imaging of breast: Secondary | ICD-10-CM | POA: Diagnosis not present

## 2016-10-30 ENCOUNTER — Other Ambulatory Visit: Payer: Self-pay | Admitting: General Surgery

## 2016-10-30 DIAGNOSIS — R928 Other abnormal and inconclusive findings on diagnostic imaging of breast: Secondary | ICD-10-CM

## 2016-10-30 DIAGNOSIS — R921 Mammographic calcification found on diagnostic imaging of breast: Secondary | ICD-10-CM

## 2016-11-06 ENCOUNTER — Ambulatory Visit: Payer: PRIVATE HEALTH INSURANCE | Admitting: General Surgery

## 2016-11-12 ENCOUNTER — Other Ambulatory Visit: Payer: Self-pay | Admitting: General Surgery

## 2016-11-12 ENCOUNTER — Ambulatory Visit
Admission: RE | Admit: 2016-11-12 | Discharge: 2016-11-12 | Disposition: A | Discharge status: Home / Self Care | Payer: BLUE CROSS/BLUE SHIELD | Source: Ambulatory Visit | Attending: General Surgery | Admitting: General Surgery

## 2016-11-12 DIAGNOSIS — R921 Mammographic calcification found on diagnostic imaging of breast: Secondary | ICD-10-CM

## 2016-11-12 DIAGNOSIS — R928 Other abnormal and inconclusive findings on diagnostic imaging of breast: Secondary | ICD-10-CM

## 2016-11-16 ENCOUNTER — Ambulatory Visit: Payer: 59 | Admitting: Internal Medicine

## 2016-11-16 ENCOUNTER — Other Ambulatory Visit: Payer: 59

## 2016-11-22 ENCOUNTER — Ambulatory Visit (INDEPENDENT_AMBULATORY_CARE_PROVIDER_SITE_OTHER): Payer: Managed Care, Other (non HMO) | Admitting: General Surgery

## 2016-11-22 ENCOUNTER — Encounter: Payer: Self-pay | Admitting: General Surgery

## 2016-11-22 VITALS — BP 114/70 | HR 83 | Resp 12 | Ht 66.0 in | Wt 184.0 lb

## 2016-11-22 DIAGNOSIS — Z853 Personal history of malignant neoplasm of breast: Secondary | ICD-10-CM | POA: Diagnosis not present

## 2016-11-22 NOTE — Patient Instructions (Signed)
  Follow up in one year with Dr. Bary Castilla with diagnostic left breast mammogram. Advised to call with any questions or concerns.

## 2016-11-22 NOTE — Progress Notes (Signed)
Patient ID: Shirley Blackwell, female   DOB: [birth year], 61 y.o.   MRN: [id number]  Chief Complaint  Patient presents with  . Follow-up    mammogram     HPI Shirley Blackwell is a 61 y.o. female who presents for a breast cancer follow up . The most recent mammogram was done on 10/29/2016 and added views on 11/09/2016.  Marland Kitchen  Patient does perform regular self breast checks and gets regular mammograms done.    HPI  Past Medical History:  Diagnosis Date  . Breast cancer [facility]) 2012   Right breast- chemo  . Cancer 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 Cancer of right breast ([place])    right mastectomy invasive lobular CA,   . Colon cancer ([place])    Pre cancerous polyps  . Hepatitis   . Hx of lumpectomy 2012   right  . Hypertension 1999  . Personal history of malignant neoplasm of breast 2012   R Mastectomy,  . Prediabetes 12/06/2014  . Shingles     Past Surgical History:  Procedure Laterality Date  . BREAST BIOPSY Left    core bx- neg  . COLECTOMY Right 2013  . COLONOSCOPY  2013, 2014   Dr. Jamal Collin  . KNEE SURGERY Left 2014  . MASTECTOMY Right 2012   invasive lobular CA,    Family History  Problem Relation Age of Onset  . Cancer Sister        Theadora Rama  . Diabetes Mother   . Hypertension Mother   . Kidney disease Mother   . Hypertension Father   . Diabetes Maternal Grandmother   . Heart failure Maternal Grandmother   . Heart failure Paternal Grandfather   . Breast cancer Neg Hx     Social History Social History  Substance Use Topics  . Smoking status: Never Smoker  . Smokeless tobacco: Never Used  . Alcohol use No    Allergies  Allergen Reactions  . Sulfa Antibiotics Hives    Other reaction(s): Hives  . Tape Other (See Comments)    blisters    Current Outpatient Prescriptions  Medication Sig Dispense Refill  . amLODipine-benazepril (LOTREL) 5-10 MG capsule Take 1 capsule by mouth daily. 90 capsule 0  . Potassium Chloride ER  20 MEQ TBCR Take 20 mEq by mouth daily. (Patient taking differently: Take 20 mEq by mouth as needed. ) 90 tablet 1  . triamterene-hydrochlorothiazide (MAXZIDE-25) 37.5-25 MG tablet Take 0.5 tablets by mouth daily. 45 tablet 3   No current facility-administered medications for this visit.     Review of Systems Review of Systems  Constitutional: Negative.   Respiratory: Negative.   Cardiovascular: Negative.     Blood pressure 114/70, pulse 83, resp. rate 12, height 5' 6"  (1.676 m), weight 184 lb (83.5 kg).  Physical Exam Physical Exam  Constitutional: She is oriented to person, place, and time. She appears well-developed and well-nourished.  Eyes: Conjunctivae are normal. No scleral icterus.  Neck: Neck supple.  Cardiovascular: Normal rate, regular rhythm and normal heart sounds.   Pulmonary/Chest: Effort normal and breath sounds normal.  Abdominal: Soft. Bowel sounds are normal.  Lymphadenopathy:    She has no cervical adenopathy.    She has no axillary adenopathy.  Right sided axillary mass- scarring unchanged from last visit, biopsy was benign.  Neurological: She is alert and oriented to person, place, and time.  Skin: Skin is warm and dry.  Psychiatric: She has  a normal mood and affect. Her behavior is normal.    Data Reviewed Prior notes and mammogram reviewed. Report suggests suspicious calcifications in left breast Assessment    History of right breast cancer- invasive lobular, ER/PR pos and HER2 neg. Pt was stopped off arimidex last year. History of right colon polyp- s/p right colectomy in 2013 Mass in right axillary area- unchanged, present for many years, biopsy results benign Mammogram shows diffuse calcifications of Left breast- present for many years, appears fairly stable. Most likely does not need intervention at this time, will discuss with radiologist (Dr. Augustin Coupe)    Plan    Follow up in one year with Dr. Bary Castilla with diagnostic left breast mammogram. Advised  to call with any questions or concerns.     HPI, Physical Exam, Assessment and Plan have been scribed under the direction and in the presence of Mckinley Jewel, MD  Gaspar Cola, CMA  I have completed the exam and reviewed the above documentation for accuracy and completeness.  I agree with the above.  Haematologist has been used and any errors in dictation or transcription are unintentional.  Jayleen Afonso G. Jamal Collin, M.D., F.A.C.S.   Junie Panning Darnell Level 11/22/2016, 2:08 PM

## 2016-11-30 ENCOUNTER — Other Ambulatory Visit: Payer: Self-pay | Admitting: *Deleted

## 2016-11-30 DIAGNOSIS — Z170 Estrogen receptor positive status [ER+]: Principal | ICD-10-CM

## 2016-11-30 DIAGNOSIS — C50411 Malignant neoplasm of upper-outer quadrant of right female breast: Secondary | ICD-10-CM

## 2016-12-06 ENCOUNTER — Inpatient Hospital Stay: Payer: BLUE CROSS/BLUE SHIELD | Attending: Internal Medicine

## 2016-12-06 ENCOUNTER — Inpatient Hospital Stay (HOSPITAL_BASED_OUTPATIENT_CLINIC_OR_DEPARTMENT_OTHER): Payer: BLUE CROSS/BLUE SHIELD | Admitting: Internal Medicine

## 2016-12-06 VITALS — BP 99/77 | HR 70 | Temp 97.5°F | Resp 20 | Ht 66.0 in | Wt 184.5 lb

## 2016-12-06 DIAGNOSIS — M810 Age-related osteoporosis without current pathological fracture: Secondary | ICD-10-CM | POA: Insufficient documentation

## 2016-12-06 DIAGNOSIS — Z9221 Personal history of antineoplastic chemotherapy: Secondary | ICD-10-CM

## 2016-12-06 DIAGNOSIS — C50411 Malignant neoplasm of upper-outer quadrant of right female breast: Secondary | ICD-10-CM

## 2016-12-06 DIAGNOSIS — Z9011 Acquired absence of right breast and nipple: Secondary | ICD-10-CM

## 2016-12-06 DIAGNOSIS — M8580 Other specified disorders of bone density and structure, unspecified site: Secondary | ICD-10-CM | POA: Diagnosis not present

## 2016-12-06 DIAGNOSIS — Z79899 Other long term (current) drug therapy: Secondary | ICD-10-CM | POA: Insufficient documentation

## 2016-12-06 DIAGNOSIS — I10 Essential (primary) hypertension: Secondary | ICD-10-CM | POA: Diagnosis not present

## 2016-12-06 DIAGNOSIS — Z9223 Personal history of estrogen therapy: Secondary | ICD-10-CM | POA: Insufficient documentation

## 2016-12-06 DIAGNOSIS — R7303 Prediabetes: Secondary | ICD-10-CM | POA: Diagnosis not present

## 2016-12-06 DIAGNOSIS — Z170 Estrogen receptor positive status [ER+]: Secondary | ICD-10-CM | POA: Diagnosis not present

## 2016-12-06 DIAGNOSIS — Z853 Personal history of malignant neoplasm of breast: Secondary | ICD-10-CM | POA: Diagnosis present

## 2016-12-06 DIAGNOSIS — Z85038 Personal history of other malignant neoplasm of large intestine: Secondary | ICD-10-CM

## 2016-12-06 LAB — CBC WITH DIFFERENTIAL/PLATELET
BASOS ABS: 0.1 10*3/uL (ref 0–0.1)
BASOS PCT: 1 %
EOS ABS: 0.1 10*3/uL (ref 0–0.7)
EOS PCT: 3 %
HCT: 40.1 % (ref 35.0–47.0)
Hemoglobin: 13.9 g/dL (ref 12.0–16.0)
Lymphocytes Relative: 41 %
Lymphs Abs: 2.3 10*3/uL (ref 1.0–3.6)
MCH: 28.5 pg (ref 26.0–34.0)
MCHC: 34.5 g/dL (ref 32.0–36.0)
MCV: 82.5 fL (ref 80.0–100.0)
Monocytes Absolute: 0.4 10*3/uL (ref 0.2–0.9)
Monocytes Relative: 7 %
Neutro Abs: 2.7 10*3/uL (ref 1.4–6.5)
Neutrophils Relative %: 48 %
PLATELETS: 259 10*3/uL (ref 150–440)
RBC: 4.87 MIL/uL (ref 3.80–5.20)
RDW: 13.3 % (ref 11.5–14.5)
WBC: 5.6 10*3/uL (ref 3.6–11.0)

## 2016-12-06 LAB — COMPREHENSIVE METABOLIC PANEL
ALBUMIN: 4.1 g/dL (ref 3.5–5.0)
ALK PHOS: 81 U/L (ref 38–126)
ALT: 19 U/L (ref 14–54)
AST: 22 U/L (ref 15–41)
Anion gap: 7 (ref 5–15)
BILIRUBIN TOTAL: 0.7 mg/dL (ref 0.3–1.2)
BUN: 13 mg/dL (ref 6–20)
CALCIUM: 9.2 mg/dL (ref 8.9–10.3)
CO2: 28 mmol/L (ref 22–32)
CREATININE: 0.76 mg/dL (ref 0.44–1.00)
Chloride: 105 mmol/L (ref 101–111)
GFR calc Af Amer: >60 mL/min (ref >60)
GLUCOSE: 110 mg/dL — AB (ref 65–99)
Potassium: 3.5 mmol/L (ref 3.5–5.1)
Sodium: 140 mmol/L (ref 135–145)
TOTAL PROTEIN: 7.9 g/dL (ref 6.5–8.1)

## 2016-12-06 NOTE — Progress Notes (Signed)
[facility] OFFICE PROGRESS NOTE  Patient Care Team: Lada, Satira Anis, MD as PCP - General (Family Medicine) Christene Lye, MD (General Surgery)  Cancer Staging No matching staging information was found for the patient.   Oncology History   1. Carcinoma of breast (right) upper and outer quadrant.  Ultrasound guided biopsy, date Oct 23, 2010. Invasive LOBULAR CANCER, Estrogen receptor positive, progesterone receptor positive, HER-2 receptor negative, by FISH. 2. Status post mastectomy [Dr.sankar] in June of 2012. T2 N0, M0 tumor (size of the tumor is close to 5 cm) 3. Started on chemotherapy with Cytoxan and Taxotere, July of 2012. 4. Finished Cytoxan, Taxotere for 4 cycles, September of 2012; NO RT.  5. Started on Femara October of 2012. 6. Patient discontinued Femara due to joint pain, Feb. 2014. 7. Started Tamoxifen, March 2014. 8.  Patient did not take any tamoxifen 9.  Started on [place] from June of 2016;  # Aromasinm[June 2017] # June 28th 2017- Breast cancer index- LOW risk of recurrence [3.6%- 5-10 years]; STOP Aromasin Sep 2017.   # Genetic testing- My risk extended panel NEG  # March 2017- BMD- Osteoporosis- recommend ca+ vit D BID [Aug 2017]; order BMD in may 2018      Carcinoma of upper-outer quadrant of right breast in female, estrogen receptor positive ([place])    INTERVAL HISTORY:  Shirley Blackwell 61 y.o.  female pleasant patient above history ofStage II breast cancer ER/PR positive HER-2/neu negative currently on Surveillance is here for follow-up. Patient finished anastrozole October 2017.   The interim patient had a mammogram that showed calcifications left breast- for which further workup/biopsy was recommended by the radiologist. However as per Dr. Jamal Collin- calcifications appear stable; recommend continued mammographic surveillance  Denies any bone pain. Denies any nausea vomiting. Denies any headache. Patient denies any new lumps or  bumps.  REVIEW OF SYSTEMS:  A complete 10 point review of system is done which is negative except mentioned above/history of present illness.   PAST MEDICAL HISTORY :  Past Medical History:  Diagnosis Date  . Breast cancer [facility]) 2012   Right breast- chemo  . Cancer [facility]) 2012   right mastectomy invasive lobular CA, 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  . Cancer of right breast ([place])    right mastectomy invasive lobular CA,   . Colon cancer ([place])    Pre cancerous polyps  . Hepatitis   . Hx of lumpectomy 2012   right  . Hypertension 1999  . Personal history of malignant neoplasm of breast 2012   R Mastectomy,  . Prediabetes 12/06/2014  . Shingles     PAST SURGICAL HISTORY :   Past Surgical History:  Procedure Laterality Date  . BREAST BIOPSY Left    core bx- neg  . COLECTOMY Right 2013  . COLONOSCOPY  2013, 2014   Dr. Jamal Collin  . KNEE SURGERY Left 2014  . MASTECTOMY Right 2012   invasive lobular CA,    FAMILY HISTORY :   Family History  Problem Relation Age of Onset  . Cancer Sister        Theadora Rama  . Diabetes Mother   . Hypertension Mother   . Kidney disease Mother   . Hypertension Father   . Diabetes Maternal Grandmother   . Heart failure Maternal Grandmother   . Heart failure Paternal Grandfather   . Breast cancer Neg Hx     SOCIAL HISTORY:   Social History  Substance Use Topics  . Smoking  status: Never Smoker  . Smokeless tobacco: Never Used  . Alcohol use No    ALLERGIES:  is allergic to sulfa antibiotics and tape.  MEDICATIONS:  Current Outpatient Prescriptions  Medication Sig Dispense Refill  . amLODipine-benazepril (LOTREL) 5-10 MG capsule Take 1 capsule by mouth daily. 90 capsule 0  . diphenhydrAMINE (BENADRYL) 25 MG tablet Take 25 mg by mouth 2 (two) times daily.    Marland Kitchen triamterene-hydrochlorothiazide (MAXZIDE-25) 37.5-25 MG tablet Take 0.5 tablets by mouth daily. 45 tablet 3  . Potassium Chloride ER 20 MEQ TBCR Take 20 mEq by mouth daily. (Patient not  taking: Reported on 12/06/2016) 90 tablet 1   No current facility-administered medications for this visit.     PHYSICAL EXAMINATION: ECOG PERFORMANCE STATUS: 0 - Asymptomatic  BP 99/77 (BP Location: Left Arm, Patient Position: Sitting)   Pulse 70   Temp 97.5 F (36.4 C) (Tympanic)   Resp 20   Ht _0  (1.676 m)   Wt 184 lb 8 oz (83.7 kg)   BMI 29.78 kg/m   Filed Weights   12/06/16 0903  Weight: 184 lb 8 oz (83.7 kg)    GENERAL: Well-nourished well-developed; Alert, no distress and comfortable.  With her daughter. EYES: no pallor or icterus OROPHARYNX: no thrush or ulceration; good dentition  NECK: supple, no masses felt LYMPH:  no palpable lymphadenopathy in the cervical, axillary or inguinal regions LUNGS: clear to auscultation and  No wheeze or crackles HEART/CVS: regular rate & rhythm and no murmurs; No lower extremity edema ABDOMEN:abdomen soft, non-tender and normal bowel sounds Musculoskeletal:no cyanosis of digits and no clubbing  PSYCH: alert & oriented x 3 with fluent speech NEURO: no focal motor/sensory deficits SKIN:  no rashes or significant lesions  LABORATORY DATA:  I have reviewed the data as listed    Component Value Date/Time   NA 140 12/06/2016 0840   NA 141 12/14/2014 1117   NA 143 09/07/2014 1527   K 3.5 12/06/2016 0840   K 3.4 (L) 09/07/2014 1527   CL 105 12/06/2016 0840   CL 105 09/07/2014 1527   CO2 28 12/06/2016 0840   CO2 28 09/07/2014 1527   GLUCOSE 110 (H) 12/06/2016 0840   GLUCOSE 113 (H) 09/07/2014 1527   BUN 13 12/06/2016 0840   BUN 10 12/14/2014 1117   BUN 13 09/07/2014 1527   CREATININE 0.76 12/06/2016 0840   CREATININE 0.90 09/10/2016 0914   CALCIUM 9.2 12/06/2016 0840   CALCIUM 9.4 09/07/2014 1527   PROT 7.9 12/06/2016 0840   PROT 7.4 12/14/2014 1117   PROT 8.2 (H) 09/07/2014 1527   ALBUMIN 4.1 12/06/2016 0840   ALBUMIN 4.2 12/14/2014 1117   ALBUMIN 4.1 09/07/2014 1527   AST 22 12/06/2016 0840   AST 26 09/07/2014 1527    ALT 19 12/06/2016 0840   ALT 25 09/07/2014 1527   ALKPHOS 81 12/06/2016 0840   ALKPHOS 84 09/07/2014 1527   BILITOT 0.7 12/06/2016 0840   BILITOT 0.4 12/14/2014 1117   BILITOT 0.4 09/07/2014 1527   GFRNONAA >60 12/06/2016 0840   GFRNONAA 70 09/10/2016 0914   GFRAA >60 12/06/2016 0840   GFRAA 81 09/10/2016 0914    No results found for: SPEP, UPEP  Lab Results  Component Value Date   WBC 5.6 12/06/2016   NEUTROABS 2.7 12/06/2016   HGB 13.9 12/06/2016   HCT 40.1 12/06/2016   MCV 82.5 12/06/2016   PLT 259 12/06/2016      Chemistry      Component  Value Date/Time   NA 140 12/06/2016 0840   NA 141 12/14/2014 1117   NA 143 09/07/2014 1527   K 3.5 12/06/2016 0840   K 3.4 (L) 09/07/2014 1527   CL 105 12/06/2016 0840   CL 105 09/07/2014 1527   CO2 28 12/06/2016 0840   CO2 28 09/07/2014 1527   BUN 13 12/06/2016 0840   BUN 10 12/14/2014 1117   BUN 13 09/07/2014 1527   CREATININE 0.76 12/06/2016 0840   CREATININE 0.90 09/10/2016 0914      Component Value Date/Time   CALCIUM 9.2 12/06/2016 0840   CALCIUM 9.4 09/07/2014 1527   ALKPHOS 81 12/06/2016 0840   ALKPHOS 84 09/07/2014 1527   AST 22 12/06/2016 0840   AST 26 09/07/2014 1527   ALT 19 12/06/2016 0840   ALT 25 09/07/2014 1527   BILITOT 0.7 12/06/2016 0840   BILITOT 0.4 12/14/2014 1117   BILITOT 0.4 09/07/2014 1527       RADIOGRAPHIC STUDIES: I have personally reviewed the radiological images as listed and agreed with the findings in the report. No results found.   ASSESSMENT & PLAN:  Carcinoma of upper-outer quadrant of right breast in female, estrogen receptor positive ([place]) # LOBULAR Breast cancer ER/PR pos her 2 Neu Neg STAGE II on Aromasin [BCI- Low risk]; stopped Anastrazole after 5 years [feb 2018]. Clinically no evidence of recurrence. Labs are normal.   # OSTEOPENIA recommend ca+ vit D BID; also discussed re: Exercise/ bisphosponates. Refer to [facility] care program.   # Mammographic abnormalities in the  left breast- discussed with Dr. Jamal Collin; who feels the changes/calcifications is stable over years.   # foolow ip in 12 months/labs.   Cc; Dr.lada.   Orders Placed This Encounter  Procedures  . CBC with Differential    Standing Status:   Future    Standing Expiration Date:   06/07/2018  . Comprehensive metabolic panel    Standing Status:   Future    Standing Expiration Date:   06/07/2018  . Cancer antigen 27.29    Standing Status:   Future    Standing Expiration Date:   06/07/2018  . AMB REFERRAL TO [facility] CARE    Referral Priority:   Routine    Referral Type:   Consultation    Number of Visits Requested:   1   All questions were answered. The patient knows to call the clinic with any problems, questions or concerns.      Cammie Sickle, MD 12/07/2016 7:45 PM

## 2016-12-06 NOTE — Progress Notes (Signed)
Patient not taking any calcium supplements or vitamin D. Discussed at length the importance of adding this supplement. Pt instructed to incorporate adequate intake of dietary calcium 1200 mg and vitamin D 800 IU daily.

## 2016-12-06 NOTE — Assessment & Plan Note (Addendum)
#   LOBULAR Breast cancer ER/PR pos her 2 Neu Neg STAGE II on Aromasin [BCI- Low risk]; stopped Anastrazole after 5 years [feb 2018]. Clinically no evidence of recurrence. Labs are normal.   # OSTEOPENIA recommend ca+ vit D BID; also discussed re: Exercise/ bisphosponates. Refer to [facility] care program.   # Mammographic abnormalities in the left breast- discussed with Dr. Jamal Collin; who feels the changes/calcifications is stable over years.   # foolow ip in 12 months/labs.   Cc; Dr.lada.

## 2017-01-02 ENCOUNTER — Other Ambulatory Visit: Payer: Self-pay | Admitting: Family Medicine

## 2017-01-02 NOTE — Telephone Encounter (Signed)
90 day

## 2017-01-03 NOTE — Telephone Encounter (Signed)
CMP from June 2018 reviewed; Rx approved

## 2017-02-21 ENCOUNTER — Other Ambulatory Visit: Payer: Self-pay

## 2017-02-21 DIAGNOSIS — I10 Essential (primary) hypertension: Secondary | ICD-10-CM

## 2017-02-21 NOTE — Telephone Encounter (Signed)
Spoke to Shirley Blackwell regarding the RX and why it was denied. Shirley Blackwell states Dr.Lada knows that she only takes this medication couple pills ( Shirley Blackwell stated no more then 3 pills) a month. She states the three month supply she had plus the refill last her a year due to that. She states the new medication she is on  Is having her leg cramp up and would like to get a refill but willing to wait until Dr.lada gets back next wednesday. She states she will call next week to see if she can speak to her about it.

## 2017-05-27 ENCOUNTER — Telehealth: Payer: Self-pay | Admitting: *Deleted

## 2017-05-27 NOTE — Telephone Encounter (Signed)
Message left on cell phone for patient to call the office.   Dr. Jamal Collin would like to see patient for an office visit before he retires at the end of the year.

## 2017-05-27 NOTE — Telephone Encounter (Signed)
Patient called back to make an appointment for Dr.Sankar. The only day she could come was Thursday. Patient is scheduled to come in to see Dr.Sankar on 05/30/17 at 1:45pm

## 2017-05-30 ENCOUNTER — Encounter: Payer: Self-pay | Admitting: *Deleted

## 2017-05-30 ENCOUNTER — Encounter: Payer: Self-pay | Admitting: General Surgery

## 2017-05-30 ENCOUNTER — Ambulatory Visit (INDEPENDENT_AMBULATORY_CARE_PROVIDER_SITE_OTHER): Payer: BLUE CROSS/BLUE SHIELD | Admitting: General Surgery

## 2017-05-30 VITALS — BP 118/72 | HR 78 | Resp 14 | Ht 67.0 in | Wt 191.0 lb

## 2017-05-30 DIAGNOSIS — R920 Mammographic microcalcification found on diagnostic imaging of breast: Secondary | ICD-10-CM

## 2017-05-30 DIAGNOSIS — Z853 Personal history of malignant neoplasm of breast: Secondary | ICD-10-CM

## 2017-05-30 NOTE — Patient Instructions (Signed)
The patient is aware to call back for any questions or concerns.  

## 2017-05-30 NOTE — Progress Notes (Signed)
Patient ID: Shirley Blackwell, female   DOB: 01-01-1956, 61 y.o.   MRN: [id number]  Chief Complaint  Patient presents with  . Follow-up    HPI Shirley Blackwell is a 61 y.o. female.  who presents for a breast cancer follow up and discuss mammogram which shows diffuse calcifications in left breast.   HPI  Past Medical History:  Diagnosis Date  . Breast cancer [facility]) 2012   Right breast- chemo  . Cancer [facility]) 2012   right mastectomy invasive lobular CA, 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  . Cancer of right breast ([place])    right mastectomy invasive lobular CA,   . Colon cancer ([place])    Pre cancerous polyps  . Hepatitis   . Hx of lumpectomy 2012   right  . Hypertension 1999  . Personal history of malignant neoplasm of breast 2012   R Mastectomy,  . Prediabetes 12/06/2014  . Shingles     Past Surgical History:  Procedure Laterality Date  . BREAST BIOPSY Left    core bx- neg  . COLECTOMY Right 2013  . COLONOSCOPY  2013, 2014   Dr. Jamal Collin  . KNEE SURGERY Left 2014  . MASTECTOMY Right 2012   invasive lobular CA,    Family History  Problem Relation Age of Onset  . Cancer Sister        Theadora Rama  . Diabetes Mother   . Hypertension Mother   . Kidney disease Mother   . Hypertension Father   . Diabetes Maternal Grandmother   . Heart failure Maternal Grandmother   . Heart failure Paternal Grandfather   . Breast cancer Neg Hx     Social History Social History   Tobacco Use  . Smoking status: Never Smoker  . Smokeless tobacco: Never Used  Substance Use Topics  . Alcohol use: No  . Drug use: No    Allergies  Allergen Reactions  . Sulfa Antibiotics Hives    Other reaction(s): Hives  . Tape Other (See Comments)    blisters    Current Outpatient Medications  Medication Sig Dispense Refill  . amLODipine-benazepril (LOTREL) 5-10 MG capsule TAKE 1 CAPSULE BY MOUTH DAILY. 90 capsule 3  . diphenhydrAMINE (BENADRYL) 25 MG tablet Take 25 mg by mouth 2 (two) times daily.    .  Potassium Chloride ER 20 MEQ TBCR Take 20 mEq by mouth daily. 90 tablet 1  . triamterene-hydrochlorothiazide (MAXZIDE-25) 37.5-25 MG tablet Take 0.5 tablets by mouth daily. 45 tablet 3   No current facility-administered medications for this visit.     Review of Systems Review of Systems  Constitutional: Negative.   Respiratory: Negative.   Cardiovascular: Negative.     Blood pressure 118/72, pulse 78, resp. rate 14, height 5' 7"  (1.702 m), weight 191 lb (86.6 kg).  Physical Exam Physical Exam  Constitutional: She is oriented to person, place, and time. She appears well-developed and well-nourished.  Neurological: She is alert and oriented to person, place, and time.  Skin: Skin is warm and dry.  Psychiatric: Her behavior is normal.    Data Reviewed   Assessment    History of right breast cancer- invasive lobular, ER/PR pos and HER2 neg. Pt was stopped off arimidex last year. History of right colon polyp- s/p right colectomy in 2013.  Diffuse calcifications in left breast- these have been present for many years with no change.  Radiologist reports from 2015 to 2017show no apparent abnormality or changes in these calcifications. However the report from  June of this year suggested this was a category 4 needs a biopsy.  I had several people reviewed these mammograms and is unclear as to whether there is any change to warrant a biopsy. As I am retiring at the end of the month I asked the patient to come  today for discussion about this.  Wanted to be sure that she was okay with continued follow-up.  She is fully aware of the preceding.     After full discussion decided on short term follow up. She will have a left diagnostic mammogram in January with office follow up after with Dr. Bary Castilla     Plan   Follow up with Dr Bary Castilla as above     HPI, Physical Exam, Assessment and Plan have been scribed under the direction and in the presence of Mckinley Jewel, MD Karie Fetch, RN I have  completed the exam and reviewed the above documentation for accuracy and completeness.  I agree with the above.  Haematologist has been used and any errors in dictation or transcription are unintentional.  Seeplaputhur G. Jamal Collin, M.D., F.A.C.S.   Junie Panning G 06/05/2017, 9:31 AM  Patient will be placed in the recalls for February 2019 with Dr. Bary Castilla.   Dominga Ferry, CMA

## 2017-07-15 ENCOUNTER — Ambulatory Visit
Admission: RE | Admit: 2017-07-15 | Discharge: 2017-07-15 | Disposition: A | Discharge status: Home / Self Care | Payer: BLUE CROSS/BLUE SHIELD | Source: Ambulatory Visit | Attending: General Surgery | Admitting: General Surgery

## 2017-07-15 DIAGNOSIS — Z853 Personal history of malignant neoplasm of breast: Secondary | ICD-10-CM | POA: Diagnosis not present

## 2017-07-15 DIAGNOSIS — R920 Mammographic microcalcification found on diagnostic imaging of breast: Secondary | ICD-10-CM | POA: Diagnosis present

## 2017-07-15 DIAGNOSIS — R921 Mammographic calcification found on diagnostic imaging of breast: Secondary | ICD-10-CM | POA: Diagnosis not present

## 2017-07-24 ENCOUNTER — Ambulatory Visit: Payer: BLUE CROSS/BLUE SHIELD | Admitting: General Surgery

## 2017-07-30 ENCOUNTER — Ambulatory Visit (INDEPENDENT_AMBULATORY_CARE_PROVIDER_SITE_OTHER): Payer: BLUE CROSS/BLUE SHIELD | Admitting: General Surgery

## 2017-07-30 ENCOUNTER — Encounter: Payer: Self-pay | Admitting: General Surgery

## 2017-07-30 VITALS — BP 124/78 | HR 75 | Resp 14 | Ht 67.0 in | Wt 197.0 lb

## 2017-07-30 DIAGNOSIS — R920 Mammographic microcalcification found on diagnostic imaging of breast: Secondary | ICD-10-CM

## 2017-07-30 DIAGNOSIS — Z853 Personal history of malignant neoplasm of breast: Secondary | ICD-10-CM | POA: Diagnosis not present

## 2017-07-30 DIAGNOSIS — Z86010 Personal history of colonic polyps: Secondary | ICD-10-CM

## 2017-07-30 NOTE — Patient Instructions (Addendum)
The patient has been asked to return to the office in one year with a left breast screening mammogram. The patient is aware to call back for any questions or concerns.

## 2017-07-31 ENCOUNTER — Encounter: Payer: Self-pay | Admitting: General Surgery

## 2017-07-31 NOTE — Progress Notes (Signed)
x

## 2017-07-31 NOTE — Progress Notes (Deleted)
Patient ID: Shirley Blackwell, female DOB: 1955/12/04, 62 y.o. MRN: [id number]     Chief Complaint  Patient presents with  . Follow-up   HPI  Shirley Blackwell is a 62 y.o. female who presents for a breast evaluation. The most recent Left breast mammogram was done on 07/15/2017.  Patient does perform regular self breast checks and gets regular mammograms done.  HPI      Past Medical History:  Diagnosis Date  . Breast cancer [facility]) 2012   Right breast- chemo  . Cancer [facility]) 2012   right mastectomy invasive lobular CA, 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  . Cancer of right breast ([place])    right mastectomy invasive lobular CA,   . Colon cancer ([place])    Pre cancerous polyps  . Hepatitis   . Hx of lumpectomy 2012   right  . Hypertension 1999  . Personal history of malignant neoplasm of breast 2012   R Mastectomy,  . Prediabetes 12/06/2014  . Shingles         Past Surgical History:  Procedure Laterality Date  . BREAST BIOPSY Left    core bx- neg  . COLECTOMY Right 2013  . COLONOSCOPY  2013, 2014   Dr. Jamal Blackwell  . KNEE SURGERY Left 2014  . MASTECTOMY Right 2012   invasive lobular CA,        Family History  Problem Relation Age of Onset  . Cancer Sister    Shirley Blackwell  . Diabetes Mother   . Hypertension Mother   . Kidney disease Mother   . Hypertension Father   . Diabetes Maternal Grandmother   . Heart failure Maternal Grandmother   . Heart failure Paternal Grandfather   . Breast cancer Neg Hx    Social History  Social History       Tobacco Use  . Smoking status: Never Smoker  . Smokeless tobacco: Never Used  Substance Use Topics  . Alcohol use: No  . Drug use: No        Allergies  Allergen Reactions  . Sulfa Antibiotics Hives    Other reaction(s): Hives  . Tape Other (See Comments)    blisters         Current Outpatient Medications  Medication Sig Dispense Refill  . amLODipine-benazepril (LOTREL) 5-10 MG capsule TAKE 1 CAPSULE BY MOUTH DAILY. 90 capsule 3  . diphenhydrAMINE  (BENADRYL) 25 MG tablet Take 25 mg by mouth 2 (two) times daily.    . Potassium Chloride ER 20 MEQ TBCR Take 20 mEq by mouth daily. (Patient taking differently: Take 20 mEq by mouth once a week. ) 90 tablet 1  . triamterene-hydrochlorothiazide (MAXZIDE-25) 37.5-25 MG tablet Take 0.5 tablets by mouth daily. 45 tablet 3   No current facility-administered medications for this visit.    Review of Systems  Review of Systems  Constitutional: Negative.  Respiratory: Negative.  Cardiovascular: Negative.  Blood pressure 124/78, pulse 75, resp. rate 14, height 5\' 7"  (1.702 m), weight 197 lb (89.4 kg).      Physical Exam  Constitutional: She is oriented to person, place, and time. She appears well-developed and well-nourished.  Eyes: Conjunctivae are normal. No scleral icterus.  Neck: Neck supple.  Cardiovascular: Normal rate, regular rhythm and normal heart sounds.  Pulmonary/Chest: Effort normal and breath sounds normal.  Right mastectomy site is well-healed.  No evidence of local recurrence.   Left breast exhibits no inverted nipple, no mass, no nipple discharge, no skin change and no tenderness.  Lymphadenopathy:  She has no cervical adenopathy.  She has no axillary adenopathy.  Neurological: She is alert and oriented to person, place, and time.  Skin: Skin is warm and dry.  Data Reviewed  Colonoscopy of June 14, 2012 was completed due to a past history of a adenoma greater than 10 mm in size. This was a normal exam with a 5-year follow-up recommended.  Left breast diagnostic mammogram dated July 15, 2017 was reviewed. No interval change. BI-RADS-2. Recommendation to change to screening studies reviewed.  Assessment   Benign breast exam. No evidence of right chest wall recurrence.  The patient is a candidate for a follow-up colonoscopy later this year.   Plan  The patient has been asked to return to the office in one year with a left breast screening mammogram. The patient is aware to  call back for any questions or concerns.  HPI, Physical Exam, Assessment and Plan have been scribed under the direction and in the presence of Hervey Ard, MD.  Gaspar Cola, CMA  I have completed the exam and reviewed the above documentation for accuracy and completeness. I agree with the above. Haematologist has been used and any errors in dictation or transcription are unintentional.  Hervey Ard, M.D., F.A.C.S.  [name]  07/30/2017, 9:22 PM

## 2017-08-21 ENCOUNTER — Encounter: Payer: Self-pay | Admitting: General Surgery

## 2017-08-22 ENCOUNTER — Encounter: Payer: Self-pay | Admitting: General Surgery

## 2017-09-12 ENCOUNTER — Encounter: Payer: 59 | Admitting: Family Medicine

## 2017-09-22 ENCOUNTER — Other Ambulatory Visit: Payer: Self-pay | Admitting: Family Medicine

## 2017-09-22 DIAGNOSIS — Z5181 Encounter for therapeutic drug level monitoring: Secondary | ICD-10-CM

## 2017-09-22 DIAGNOSIS — E782 Mixed hyperlipidemia: Secondary | ICD-10-CM

## 2017-09-22 DIAGNOSIS — R7303 Prediabetes: Secondary | ICD-10-CM

## 2017-09-22 NOTE — Telephone Encounter (Signed)
Patient's last visit was over a year ago; she has an upcoming appointment, but it's not until June She was due to have labs rechecked 6 weeks after the last set (which would have been May of 2018) I've refilled her medicine, but please ask her to come in for fasting labs this week or next please; we need to check her sugar to make sure she's not progressing to diabetes, and check her kidney function, electrolytes, and cholesterol Thank you

## 2017-09-23 NOTE — Telephone Encounter (Signed)
Pt notified, will come in

## 2017-11-07 ENCOUNTER — Telehealth: Payer: Self-pay

## 2017-11-07 NOTE — Telephone Encounter (Signed)
Left message for patient to call back to see about scheduling her Colonoscopy for June/July. Per Dr Bary Castilla no office visit needed as long as health status is the same.

## 2017-11-13 LAB — LIPID PANEL
CHOLESTEROL: 236 mg/dL — AB (ref <200)
HDL: 46 mg/dL — AB (ref >50)
LDL Cholesterol (Calc): 152 mg/dL (calc) — ABNORMAL HIGH
Non-HDL Cholesterol (Calc): 190 mg/dL (calc) — ABNORMAL HIGH (ref <130)
Total CHOL/HDL Ratio: 5.1 (calc) — ABNORMAL HIGH (ref <5.0)
Triglycerides: 224 mg/dL — ABNORMAL HIGH (ref <150)

## 2017-11-13 LAB — COMPLETE METABOLIC PANEL WITH GFR
AG RATIO: 1.3 (calc) (ref 1.0–2.5)
ALBUMIN MSPROF: 4.3 g/dL (ref 3.6–5.1)
ALT: 29 U/L (ref 6–29)
AST: 23 U/L (ref 10–35)
Alkaline phosphatase (APISO): 80 U/L (ref 33–130)
BUN: 10 mg/dL (ref 7–25)
CALCIUM: 9.7 mg/dL (ref 8.6–10.4)
CO2: 28 mmol/L (ref 20–32)
Chloride: 105 mmol/L (ref 98–110)
Creat: 0.78 mg/dL (ref 0.50–0.99)
GFR, EST AFRICAN AMERICAN: 96 mL/min/{1.73_m2} (ref >=60)
GFR, EST NON AFRICAN AMERICAN: 83 mL/min/{1.73_m2} (ref >=60)
GLOBULIN: 3.3 g/dL (ref 1.9–3.7)
Glucose, Bld: 101 mg/dL — ABNORMAL HIGH (ref 65–99)
POTASSIUM: 3.5 mmol/L (ref 3.5–5.3)
SODIUM: 141 mmol/L (ref 135–146)
TOTAL PROTEIN: 7.6 g/dL (ref 6.1–8.1)
Total Bilirubin: 0.6 mg/dL (ref 0.2–1.2)

## 2017-11-13 LAB — HEMOGLOBIN A1C
HEMOGLOBIN A1C: 5.9 %{Hb} — AB (ref <5.7)
Mean Plasma Glucose: 123 (calc)
eAG (mmol/L): 6.8 (calc)

## 2017-11-18 ENCOUNTER — Encounter: Payer: Self-pay | Admitting: Family Medicine

## 2017-11-18 ENCOUNTER — Ambulatory Visit (INDEPENDENT_AMBULATORY_CARE_PROVIDER_SITE_OTHER): Payer: BLUE CROSS/BLUE SHIELD | Admitting: Family Medicine

## 2017-11-18 VITALS — BP 98/64 | HR 86 | Temp 98.6°F | Resp 12 | Ht 67.0 in | Wt 190.5 lb

## 2017-11-18 DIAGNOSIS — E782 Mixed hyperlipidemia: Secondary | ICD-10-CM

## 2017-11-18 DIAGNOSIS — R7303 Prediabetes: Secondary | ICD-10-CM

## 2017-11-18 DIAGNOSIS — E663 Overweight: Secondary | ICD-10-CM | POA: Diagnosis not present

## 2017-11-18 DIAGNOSIS — M8588 Other specified disorders of bone density and structure, other site: Secondary | ICD-10-CM | POA: Diagnosis not present

## 2017-11-18 DIAGNOSIS — M8580 Other specified disorders of bone density and structure, unspecified site: Secondary | ICD-10-CM | POA: Insufficient documentation

## 2017-11-18 DIAGNOSIS — Z5181 Encounter for therapeutic drug level monitoring: Secondary | ICD-10-CM

## 2017-11-18 DIAGNOSIS — Z0000 Encounter for general adult medical examination without abnormal findings: Secondary | ICD-10-CM

## 2017-11-18 DIAGNOSIS — Z0001 Encounter for general adult medical examination with abnormal findings: Secondary | ICD-10-CM | POA: Diagnosis not present

## 2017-11-18 DIAGNOSIS — I10 Essential (primary) hypertension: Secondary | ICD-10-CM

## 2017-11-18 MED ORDER — POTASSIUM CHLORIDE ER 10 MEQ PO TBCR: 10.0000 meq | EXTENDED_RELEASE_TABLET | ORAL | 1 refills | Status: DC

## 2017-11-18 NOTE — Progress Notes (Signed)
Patient ID: Shirley Blackwell, female   DOB: Dec 05, 1955, 62 y.o.   MRN: [id number]   Subjective:   Shirley Blackwell is a 62 y.o. female here for a complete physical exam  Interim issues since last visit: nothing major  USPSTF grade A and B recommendations Depression:  Depression screen [facility] 2/9 11/18/2017 09/24/2016 09/10/2016 06/07/2015 12/06/2014  Decreased Interest 0 0 0 0 0  Down, Depressed, Hopeless 0 0 0 0 0  PHQ - 2 Score 0 0 0 0 0   Hypertension: patient wishes to leave alone; monitoring at home; goes up more often when stressed; has cuff at home; needs a refill of potassium; can feel when it gets low; reviewed her last K+ levels; she would only take the 20 mEq once a month or so; won't take it every day, just when cramps occur; she is aware of the medicine she takes which can increase body's ability to hold on to potassium BP Readings from Last 3 Encounters:  11/18/17 98/64  07/30/17 124/78  05/30/17 118/72   Obesity: working on weight loss Wt Readings from Last 3 Encounters:  11/18/17 190 lb 8 oz (86.4 kg)  07/30/17 197 lb (89.4 kg)  05/30/17 191 lb (86.6 kg)   BMI Readings from Last 3 Encounters:  11/18/17 29.84 kg/m  07/30/17 30.85 kg/m  05/30/17 29.91 kg/m     Skin cancer: no worrisome moles now; saw dermatologist a few years ago; they looked at it and already said benign Lung cancer:  Never smoker Breast cancer: managed by Dr. Bary Castilla and will be seeing him in July; s/p right mastectomy; left breast mammo was negative Jul 15, 2017; next due Jul 15, 2018; had breast exam in October or November; no new lumps or bumps Colorectal cancer: managed by Dr. Bary Castilla and will be seeing him in July Cervical cancer screening: UTD BRCA gene screening: family hx of breast and/or ovarian cancer and/or metastatic prostate cancer? Father may have a little prostate cancer but he is in his 58's; not metastasized; slow growing; no other breast or ovarian cancer; sister had uterine cancer but she  was obese; patient had genetic testing, not BRCA HIV, hep B, hep C: patient declined STD testing and prevention (chl/gon/syphilis): patient declined Intimate partner violence: no abuse Contraception: n/a Osteoporosis screening: was on a medicine for breast cancer and it was supposed to weaken her bones, but she has lost an inch, multiple rib fractures in the past; diagnosed with osteopenia in 2018, improved from 2014 Fall prevention/vitamin D: discussed; has slowed down to prevent falls; getting time outside; two tubs of spinach a week Immunizations: reviewed; eligible for shingrix, she'll consider Diet: pretty good eater, doesn't eat out much, does not fry food much Exercise: gets regular exercise, since weather improved, 3-4 hours in the yard and garden at a time; getting 2,000 steps, sometimes up to [place] Alcohol: no Tobacco use: never AAA: n/a Aspirin: not taking  Glucose: prediabetes; using monk fruit for sweeteners Glucose  Date Value Ref Range Status  09/07/2014 113 (H) mg/dL Final    Comment:    65-99 NOTE: New Reference Range  08/17/14   02/04/2013 102 (H) 65 - 99 mg/dL Final  12/22/2012 97 65 - 99 mg/dL Final   Glucose, Bld  Date Value Ref Range Status  11/12/2017 101 (H) 65 - 99 mg/dL Final    Comment:    .            Fasting reference interval . For someone without  known diabetes, a glucose value between 100 and 125 mg/dL is consistent with prediabetes and should be confirmed with a follow-up test. .   12/06/2016 110 (H) 65 - 99 mg/dL Final  09/10/2016 106 (H) 65 - 99 mg/dL Final   Lipids: people in her family have bad reactions to statins; refuses to take Lab Results  Component Value Date   CHOL 236 (H) 11/12/2017   CHOL 216 (H) 09/10/2016   CHOL 208 (H) 12/14/2014   Lab Results  Component Value Date   HDL 46 (L) 11/12/2017   HDL 52 09/10/2016   HDL 47 12/14/2014   Lab Results  Component Value Date   LDLCALC 152 (H) 11/12/2017   LDLCALC 138 (H)  09/10/2016   LDLCALC 138 (H) 12/14/2014   Lab Results  Component Value Date   TRIG 224 (H) 11/12/2017   TRIG 130 09/10/2016   TRIG 113 12/14/2014   Lab Results  Component Value Date   CHOLHDL 5.1 (H) 11/12/2017   CHOLHDL 4.2 09/10/2016   CHOLHDL 4.4 12/14/2014   No results found for: LDLDIRECT   Past Medical History:  Diagnosis Date  . Breast cancer [facility]) 2012   Right breast- chemo  . 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 Cancer of right breast ([place])    right mastectomy invasive lobular CA,   . Colon cancer ([place])    Pre cancerous polyps  . Hepatitis   . Hx of lumpectomy 2012   right  . Hypertension 1999  . Personal history of malignant neoplasm of breast 2012   R Mastectomy,  . Prediabetes 12/06/2014  . Shingles    Past Surgical History:  Procedure Laterality Date  . BREAST BIOPSY Left    core bx- neg  . 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  0/10 lymph nodes.  . COLONOSCOPY  2013, 2014   Dr. Jamal Collin  . KNEE SURGERY Left 2014  . MASTECTOMY Right 2012   invasive lobular CA,   Family History  Problem Relation Age of Onset  . Cancer Sister        Theadora Rama  . Diabetes Mother   . Hypertension Mother   . Kidney disease Mother   . Hypertension Father   . Diabetes Maternal Grandmother   . Heart failure Maternal Grandmother   . Heart failure Paternal Grandfather   . Breast cancer Neg Hx    Social History   Tobacco Use  . Smoking status: Never Smoker  . Smokeless tobacco: Never Used  Substance Use Topics  . Alcohol use: No  . Drug use: No   Review of Systems  Objective:   Vitals:   11/18/17 1455  BP: 98/64  Pulse: 86  Resp: 12  Temp: 98.6 F (37 C)  TempSrc: Oral  SpO2: 93%  Weight: 190 lb 8 oz (86.4 kg)  Height: 5' 7"  (1.702 m)   Body mass index is 29.84 kg/m. Wt Readings from Last 3 Encounters:  11/18/17 190 lb 8  oz (86.4 kg)  07/30/17 197 lb (89.4 kg)  05/30/17 191 lb (86.6 kg)   Physical Exam  Constitutional: She appears well-developed and well-nourished.  HENT:  Head: Normocephalic and atraumatic.  Right Ear: Hearing, tympanic membrane, external ear and ear canal normal.  Left Ear: Hearing, tympanic membrane, external ear and ear canal normal.  Eyes: Conjunctivae and EOM are normal. Right eye exhibits no hordeolum. Left  eye exhibits no hordeolum. No scleral icterus.  Neck: Carotid bruit is not present. No thyromegaly present.  Cardiovascular: Normal rate, regular rhythm, S1 normal, S2 normal and normal heart sounds.  No extrasystoles are present.  Pulmonary/Chest: Effort normal and breath sounds normal. No respiratory distress.  Abdominal: Soft. Normal appearance and bowel sounds are normal. She exhibits no distension, no abdominal bruit, no pulsatile midline mass and no mass. There is no hepatosplenomegaly. There is no tenderness. No hernia.  Musculoskeletal: Normal range of motion. She exhibits no edema.  Lymphadenopathy:       Head (right side): No submandibular adenopathy present.       Head (left side): No submandibular adenopathy present.    She has no cervical adenopathy.    She has no axillary adenopathy.  Neurological: She is alert. She displays no tremor. No cranial nerve deficit. She exhibits normal muscle tone. Gait normal.  Reflex Scores:      Patellar reflexes are 2+ on the right side and 2+ on the left side. Skin: Skin is warm and dry. No bruising and no ecchymosis noted. No cyanosis. No pallor.  Psychiatric: Her speech is normal and behavior is normal. Thought content normal. Her mood appears not anxious. She does not exhibit a depressed mood.    Assessment/Plan:   Problem List Items Addressed This Visit      Cardiovascular and Mediastinum   Benign essential HTN    Pressure is low today, but patient does not want to adjust her medicine; we talked about her meds, risk of K+  supplementation with the ARB and triamterene; she will limit to no more than one per week, only use when symptoms of cramping; she will work on weight loss        Musculoskeletal and Integument   Osteopenia    Reviewed last DEXA scan; she will have next DEXA in May 2020; discussed calcium, dark green leafy veggies, vit D through sunlight, etc.        Other   Medication monitoring encounter   Relevant Orders   Basic metabolic panel   Preventative health care - Primary    USPSTF grade A and B recommendations reviewed with patient; age-appropriate recommendations, preventive care, screening tests, etc discussed and encouraged; healthy living encouraged; see AVS for patient education given to patient       Prediabetes    Patient will continue to work on weight loss and smart eating; she does not want to take medicine (metformin)      Overweight (BMI 25.0-29.9)    Patient will work on weight loss      HLD (hyperlipidemia)    She refuses to take a statin; she does not want to take any other medicine; will work on dietary changes with her husband; return for fasting labs only in 12 weeks      Relevant Orders   Lipid panel       Meds ordered this encounter  Medications  . potassium chloride (KLOR-CON 10) 10 MEQ tablet    Sig: Take 1 tablet (10 mEq total) by mouth once a week.    Dispense:  12 tablet    Refill:  1   Orders Placed This Encounter  Procedures  . Lipid panel    Standing Status:   Future    Standing Expiration Date:   04/07/2018  . Basic metabolic panel    Standing Status:   Future    Standing Expiration Date:   04/07/2018    Follow up  plan: Return in about 1 year (around 11/19/2018) for complete physical.  An After Visit Summary was printed and given to the patient.

## 2017-11-18 NOTE — Assessment & Plan Note (Signed)
Patient will work on weight loss.

## 2017-11-18 NOTE — Assessment & Plan Note (Addendum)
Patient will continue to work on weight loss and smart eating; she does not want to take medicine (metformin)

## 2017-11-18 NOTE — Patient Instructions (Addendum)
Your next bone density will be due Oct 19, 2018 or just after Consider getting the new shingles vaccine called Shingrix; that is available for individuals 60 years of age and older, and is recommended even if you have had shingles in the past and/or already received the old shingles vaccine (Zostavax); it is a two-part series, and is available at many local pharmacies Return on or after September 2nd for fasting labs Check out the information at familydoctor.org entitled "Nutrition for Weight Loss: What You Need to Know about Fad Diets" Try to lose between 1-2 pounds per week by taking in fewer calories and burning off more calories You can succeed by limiting portions, limiting foods dense in calories and fat, becoming more active, and drinking 8 glasses of water a day (64 ounces) Don't skip meals, especially breakfast, as skipping meals may alter your metabolism Do not use over-the-counter weight loss pills or gimmicks that claim rapid weight loss A healthy BMI (or body mass index) is between 18.5 and 24.9 You can calculate your ideal BMI at the [place] website ClubMon[employer]ize.fr    Recombinant Zoster (Shingles) Vaccine, RZV: What You Need to Know 1. Why get vaccinated? Shingles (also called herpes zoster, or just zoster) is a painful skin rash, often with blisters. Shingles is caused by the varicella zoster virus, the same virus that causes chickenpox. After you have chickenpox, the virus stays in your body and can cause shingles later in life. You can't catch shingles from another person. However, a person who has never had chickenpox (or chickenpox vaccine) could get chickenpox from someone with shingles. A shingles rash usually appears on one side of the face or body and heals within 2 to 4 weeks. Its main symptom is pain, which can be severe. Other symptoms can include fever, headache, chills and upset stomach. Very rarely, a shingles infection can  lead to pneumonia, hearing problems, blindness, brain inflammation (encephalitis), or death. For about 1 person in 5, severe pain can continue even long after the rash has cleared up. This long-lasting pain is called post-herpetic neuralgia (PHN). Shingles is far more common in people 74 years of age and older than in younger people, and the risk increases with age. It is also more common in people whose immune system is weakened because of a disease such as cancer, or by drugs such as steroids or chemotherapy. At least 1 million people a year in the [place] get shingles. 2. Shingles vaccine (recombinant) Recombinant shingles vaccine was approved by FDA in 2017 for the prevention of shingles. In clinical trials, it was more than 90% effective in preventing shingles. It can also reduce the likelihood of PHN. Two doses, 2 to 6 months apart, are recommended for adults 87 and older. This vaccine is also recommended for people who have already gotten the live shingles vaccine (Zostavax). There is no live virus in this vaccine. 3. Some people should not get this vaccine Tell your vaccine provider if you:  Have any severe, life-threatening allergies. A person who has ever had a life-threatening allergic reaction after a dose of recombinant shingles vaccine, or has a severe allergy to any component of this vaccine, may be advised not to be vaccinated. Ask your health care provider if you want information about vaccine components.  Are pregnant or breastfeeding. There is not much information about use of recombinant shingles vaccine in pregnant or nursing women. Your healthcare provider might recommend delaying vaccination.  Are not feeling well. If you have a  mild illness, such as a cold, you can probably get the vaccine today. If you are moderately or severely ill, you should probably wait until you recover. Your doctor can advise you.  4. Risks of a vaccine reaction With any medicine, including  vaccines, there is a chance of reactions. After recombinant shingles vaccination, a person might experience:  Pain, redness, soreness, or swelling at the site of the injection  Headache, muscle aches, fever, shivering, fatigue  In clinical trials, most people got a sore arm with mild or moderate pain after vaccination, and some also had redness and swelling where they got the shot. Some people felt tired, had muscle pain, a headache, shivering, fever, stomach pain, or nausea. About 1 out of 6 people who got recombinant zoster vaccine experienced side effects that prevented them from doing regular activities. Symptoms went away on their own in about 2 to 3 days. Side effects were more common in younger people. You should still get the second dose of recombinant zoster vaccine even if you had one of these reactions after the first dose. Other things that could happen after this vaccine:  People sometimes faint after medical procedures, including vaccination. Sitting or lying down for about 15 minutes can help prevent fainting and injuries caused by a fall. Tell your provider if you feel dizzy or have vision changes or ringing in the ears.  Some people get shoulder pain that can be more severe and longer-lasting than routine soreness that can follow injections. This happens very rarely.  Any medication can cause a severe allergic reaction. Such reactions to a vaccine are estimated at about 1 in a million doses, and would happen within a few minutes to a few hours after the vaccination. As with any medicine, there is a very remote chance of a vaccine causing a serious injury or death. The safety of vaccines is always being monitored. For more information, visit: http://www.aguilar.org/ 5. What if there is a serious problem? What should I look for?  Look for anything that concerns you, such as signs of a severe allergic reaction, very high fever, or unusual behavior. Signs of a severe allergic  reaction can include hives, swelling of the face and throat, difficulty breathing, a fast heartbeat, dizziness, and weakness. These would usually start a few minutes to a few hours after the vaccination. What should I do?  If you think it is a severe allergic reaction or other emergency that can't wait, call 9-1-1 and get to the nearest hospital. Otherwise, call your health care provider. Afterward, the reaction should be reported to the Vaccine Adverse Event Reporting System (VAERS). Your doctor should file this report, or you can do it yourself through the VAERS web site atwww.vaers.https://www.bray.com/ by calling 240-529-2321. VAERS does not give medical advice. 6. How can I learn more?  Ask your healthcare provider. He or she can give you the vaccine package insert or suggest other sources of information.  Call your local or state health department.  Contact the Centers for Disease Control and Prevention (CDC): ? Call 2891200866 (1-800-CDC-INFO) or ? Visit the CDC's website at http://hunter.com/ CDC Vaccine Information Statement (VIS) Recombinant Zoster Vaccine (07/23/2016) This information is not intended to replace advice given to you by your health care provider. Make sure you discuss any questions you have with your health care provider. Document Released: 08/07/2016 Document Revised: 08/07/2016 Document Reviewed: 08/07/2016 Elsevier Interactive Patient Education  2018 [place] Maintenance, Female Adopting a healthy lifestyle and getting preventive care  can go a long way to promote health and wellness. Talk with your health care provider about what schedule of regular examinations is right for you. This is a good chance for you to check in with your provider about disease prevention and staying healthy. In between checkups, there are plenty of things you can do on your own. Experts have done a lot of research about which lifestyle changes and preventive measures are most likely  to keep you healthy. Ask your health care provider for more information. Weight and diet Eat a healthy diet  Be sure to include plenty of vegetables, fruits, low-fat dairy products, and lean protein.  Do not eat a lot of foods high in solid fats, added sugars, or salt.  Get regular exercise. This is one of the most important things you can do for your health. ? Most adults should exercise for at least 150 minutes each week. The exercise should increase your heart rate and make you sweat (moderate-intensity exercise). ? Most adults should also do strengthening exercises at least twice a week. This is in addition to the moderate-intensity exercise.  Maintain a healthy weight  Body mass index (BMI) is a measurement that can be used to identify possible weight problems. It estimates body fat based on height and weight. Your health care provider can help determine your BMI and help you achieve or maintain a healthy weight.  For females 77 years of age and older: ? A BMI below 18.5 is considered underweight. ? A BMI of 18.5 to 24.9 is normal. ? A BMI of 25 to 29.9 is considered overweight. ? A BMI of 30 and above is considered obese.  Watch levels of cholesterol and blood lipids  You should start having your blood tested for lipids and cholesterol at 60 years of age, then have this test every 5 years.  You may need to have your cholesterol levels checked more often if: ? Your lipid or cholesterol levels are high. ? You are older than 60 years of age. ? You are at high risk for heart disease.  Cancer screening Lung Cancer  Lung cancer screening is recommended for adults 51-18 years old who are at high risk for lung cancer because of a history of smoking.  A yearly low-dose CT scan of the lungs is recommended for people who: ? Currently smoke. ? Have quit within the past 15 years. ? Have at least a 30-pack-year history of smoking. A pack year is smoking an average of one pack of  cigarettes a day for 1 year.  Yearly screening should continue until it has been 15 years since you quit.  Yearly screening should stop if you develop a health problem that would prevent you from having lung cancer treatment.  Breast Cancer  Practice breast self-awareness. This means understanding how your breasts normally appear and feel.  It also means doing regular breast self-exams. Let your health care provider know about any changes, no matter how small.  If you are in your 20s or 30s, you should have a clinical breast exam (CBE) by a health care provider every 1-3 years as part of a regular health exam.  If you are 45 or older, have a CBE every year. Also consider having a breast X-ray (mammogram) every year.  If you have a family history of breast cancer, talk to your health care provider about genetic screening.  If you are at high risk for breast cancer, talk to your health care provider about  having an MRI and a mammogram every year.  Breast cancer gene (BRCA) assessment is recommended for women who have family members with BRCA-related cancers. BRCA-related cancers include: ? Breast. ? Ovarian. ? Tubal. ? Peritoneal cancers.  Results of the assessment will determine the need for genetic counseling and BRCA1 and BRCA2 testing.  Cervical Cancer Your health care provider may recommend that you be screened regularly for cancer of the pelvic organs (ovaries, uterus, and vagina). This screening involves a pelvic examination, including checking for microscopic changes to the surface of your cervix (Pap test). You may be encouraged to have this screening done every 3 years, beginning at age 18.  For women ages 38-65, health care providers may recommend pelvic exams and Pap testing every 3 years, or they may recommend the Pap and pelvic exam, combined with testing for human papilloma virus (HPV), every 5 years. Some types of HPV increase your risk of cervical cancer. Testing for HPV  may also be done on women of any age with unclear Pap test results.  Other health care providers may not recommend any screening for nonpregnant women who are considered low risk for pelvic cancer and who do not have symptoms. Ask your health care provider if a screening pelvic exam is right for you.  If you have had past treatment for cervical cancer or a condition that could lead to cancer, you need Pap tests and screening for cancer for at least 20 years after your treatment. If Pap tests have been discontinued, your risk factors (such as having a new sexual partner) need to be reassessed to determine if screening should resume. Some women have medical problems that increase the chance of getting cervical cancer. In these cases, your health care provider may recommend more frequent screening and Pap tests.  Colorectal Cancer  This type of cancer can be detected and often prevented.  Routine colorectal cancer screening usually begins at 60 years of age and continues through 60 years of age.  Your health care provider may recommend screening at an earlier age if you have risk factors for colon cancer.  Your health care provider may also recommend using home test kits to check for hidden blood in the stool.  A small camera at the end of a tube can be used to examine your colon directly (sigmoidoscopy or colonoscopy). This is done to check for the earliest forms of colorectal cancer.  Routine screening usually begins at age 28.  Direct examination of the colon should be repeated every 5-10 years through 60 years of age. However, you may need to be screened more often if early forms of precancerous polyps or small growths are found.  Skin Cancer  Check your skin from head to toe regularly.  Tell your health care provider about any new moles or changes in moles, especially if there is a change in a mole's shape or color.  Also tell your health care provider if you have a mole that is larger  than the size of a pencil eraser.  Always use sunscreen. Apply sunscreen liberally and repeatedly throughout the day.  Protect yourself by wearing long sleeves, pants, a wide-brimmed hat, and sunglasses whenever you are outside.  Heart disease, diabetes, and high blood pressure  High blood pressure causes heart disease and increases the risk of stroke. High blood pressure is more likely to develop in: ? People who have blood pressure in the high end of the normal range (130-139/85-89 mm Hg). ? People who are  overweight or obese. ? People who are African American.  If you are 66-47 years of age, have your blood pressure checked every 3-5 years. If you are 22 years of age or older, have your blood pressure checked every year. You should have your blood pressure measured twice-once when you are at a hospital or clinic, and once when you are not at a hospital or clinic. Record the average of the two measurements. To check your blood pressure when you are not at a hospital or clinic, you can use: ? An automated blood pressure machine at a pharmacy. ? A home blood pressure monitor.  If you are between 34 years and 41 years old, ask your health care provider if you should take aspirin to prevent strokes.  Have regular diabetes screenings. This involves taking a blood sample to check your fasting blood sugar level. ? If you are at a normal weight and have a low risk for diabetes, have this test once every three years after 60 years of age. ? If you are overweight and have a high risk for diabetes, consider being tested at a younger age or more often. Preventing infection Hepatitis B  If you have a higher risk for hepatitis B, you should be screened for this virus. You are considered at high risk for hepatitis B if: ? You were born in a country where hepatitis B is common. Ask your health care provider which countries are considered high risk. ? Your parents were born in a high-risk country, and  you have not been immunized against hepatitis B (hepatitis B vaccine). ? You have HIV or AIDS. ? You use needles to inject street drugs. ? You live with someone who has hepatitis B. ? You have had sex with someone who has hepatitis B. ? You get hemodialysis treatment. ? You take certain medicines for conditions, including cancer, organ transplantation, and autoimmune conditions.  Hepatitis C  Blood testing is recommended for: ? Everyone born from [birth year] through 1963. ? Anyone with known risk factors for hepatitis C.  Sexually transmitted infections (STIs)  You should be screened for sexually transmitted infections (STIs) including gonorrhea and chlamydia if: ? You are sexually active and are younger than 60 years of age. ? You are older than 60 years of age and your health care provider tells you that you are at risk for this type of infection. ? Your sexual activity has changed since you were last screened and you are at an increased risk for chlamydia or gonorrhea. Ask your health care provider if you are at risk.  If you do not have HIV, but are at risk, it may be recommended that you take a prescription medicine daily to prevent HIV infection. This is called pre-exposure prophylaxis (PrEP). You are considered at risk if: ? You are sexually active and do not regularly use condoms or know the HIV status of your partner(s). ? You take drugs by injection. ? You are sexually active with a partner who has HIV.  Talk with your health care provider about whether you are at high risk of being infected with HIV. If you choose to begin PrEP, you should first be tested for HIV. You should then be tested every 3 months for as long as you are taking PrEP. Pregnancy  If you are premenopausal and you may become pregnant, ask your health care provider about preconception counseling.  If you may become pregnant, take 400 to 800 micrograms (mcg) of folic acid every  day.  If you want to prevent  pregnancy, talk to your health care provider about birth control (contraception). Osteoporosis and menopause  Osteoporosis is a disease in which the bones lose minerals and strength with aging. This can result in serious bone fractures. Your risk for osteoporosis can be identified using a bone density scan.  If you are 62 years of age or older, or if you are at risk for osteoporosis and fractures, ask your health care provider if you should be screened.  Ask your health care provider whether you should take a calcium or vitamin D supplement to lower your risk for osteoporosis.  Menopause may have certain physical symptoms and risks.  Hormone replacement therapy may reduce some of these symptoms and risks. Talk to your health care provider about whether hormone replacement therapy is right for you. Follow these instructions at home:  Schedule regular health, dental, and eye exams.  Stay current with your immunizations.  Do not use any tobacco products including cigarettes, chewing tobacco, or electronic cigarettes.  If you are pregnant, do not drink alcohol.  If you are breastfeeding, limit how much and how often you drink alcohol.  Limit alcohol intake to no more than 1 drink per day for nonpregnant women. One drink equals 12 ounces of beer, 5 ounces of wine, or 1 ounces of hard liquor.  Do not use street drugs.  Do not share needles.  Ask your health care provider for help if you need support or information about quitting drugs.  Tell your health care provider if you often feel depressed.  Tell your health care provider if you have ever been abused or do not feel safe at home. This information is not intended to replace advice given to you by your health care provider. Make sure you discuss any questions you have with your health care provider. Document Released: 12/11/2010 Document Revised: 11/03/2015 Document Reviewed: 03/01/2015 Elsevier Interactive Patient Education  [employer].

## 2017-11-18 NOTE — Assessment & Plan Note (Signed)
USPSTF grade A and B recommendations reviewed with patient; age-appropriate recommendations, preventive care, screening tests, etc discussed and encouraged; healthy living encouraged; see AVS for patient education given to patient  

## 2017-11-18 NOTE — Assessment & Plan Note (Signed)
Reviewed last DEXA scan; she will have next DEXA in May 2020; discussed calcium, dark green leafy veggies, vit D through sunlight, etc.

## 2017-11-18 NOTE — Assessment & Plan Note (Signed)
Pressure is low today, but patient does not want to adjust her medicine; we talked about her meds, risk of K+ supplementation with the ARB and triamterene; she will limit to no more than one per week, only use when symptoms of cramping; she will work on weight loss

## 2017-11-18 NOTE — Assessment & Plan Note (Signed)
She refuses to take a statin; she does not want to take any other medicine; will work on dietary changes with her husband; return for fasting labs only in 12 weeks

## 2017-12-06 ENCOUNTER — Inpatient Hospital Stay: Payer: BLUE CROSS/BLUE SHIELD | Attending: Internal Medicine

## 2017-12-06 ENCOUNTER — Inpatient Hospital Stay (HOSPITAL_BASED_OUTPATIENT_CLINIC_OR_DEPARTMENT_OTHER): Payer: BLUE CROSS/BLUE SHIELD | Admitting: Internal Medicine

## 2017-12-06 ENCOUNTER — Encounter: Payer: Self-pay | Admitting: Internal Medicine

## 2017-12-06 VITALS — BP 115/77 | HR 77 | Temp 97.6°F | Resp 16 | Wt 190.6 lb

## 2017-12-06 DIAGNOSIS — R079 Chest pain, unspecified: Secondary | ICD-10-CM

## 2017-12-06 DIAGNOSIS — M810 Age-related osteoporosis without current pathological fracture: Secondary | ICD-10-CM

## 2017-12-06 DIAGNOSIS — Z853 Personal history of malignant neoplasm of breast: Secondary | ICD-10-CM

## 2017-12-06 DIAGNOSIS — Z9011 Acquired absence of right breast and nipple: Secondary | ICD-10-CM

## 2017-12-06 DIAGNOSIS — C50411 Malignant neoplasm of upper-outer quadrant of right female breast: Secondary | ICD-10-CM

## 2017-12-06 DIAGNOSIS — G8929 Other chronic pain: Secondary | ICD-10-CM

## 2017-12-06 DIAGNOSIS — Z170 Estrogen receptor positive status [ER+]: Principal | ICD-10-CM

## 2017-12-06 DIAGNOSIS — M549 Dorsalgia, unspecified: Secondary | ICD-10-CM

## 2017-12-06 DIAGNOSIS — M2550 Pain in unspecified joint: Secondary | ICD-10-CM

## 2017-12-06 DIAGNOSIS — M818 Other osteoporosis without current pathological fracture: Secondary | ICD-10-CM

## 2017-12-06 DIAGNOSIS — I10 Essential (primary) hypertension: Secondary | ICD-10-CM

## 2017-12-06 DIAGNOSIS — T386X5A Adverse effect of antigonadotrophins, antiestrogens, antiandrogens, not elsewhere classified, initial encounter: Secondary | ICD-10-CM

## 2017-12-06 LAB — CBC WITH DIFFERENTIAL/PLATELET
Basophils Absolute: 0.1 10*3/uL (ref 0–0.1)
Basophils Relative: 1 %
Eosinophils Absolute: 0.1 10*3/uL (ref 0–0.7)
Eosinophils Relative: 3 %
HEMATOCRIT: 41.6 % (ref 35.0–47.0)
HEMOGLOBIN: 14.2 g/dL (ref 12.0–16.0)
LYMPHS ABS: 2.4 10*3/uL (ref 1.0–3.6)
LYMPHS PCT: 42 %
MCH: 29 pg (ref 26.0–34.0)
MCHC: 34.1 g/dL (ref 32.0–36.0)
MCV: 84.9 fL (ref 80.0–100.0)
MONO ABS: 0.4 10*3/uL (ref 0.2–0.9)
Monocytes Relative: 7 %
NEUTROS ABS: 2.7 10*3/uL (ref 1.4–6.5)
NEUTROS PCT: 47 %
Platelets: 300 10*3/uL (ref 150–440)
RBC: 4.9 MIL/uL (ref 3.80–5.20)
RDW: 13.2 % (ref 11.5–14.5)
WBC: 5.6 10*3/uL (ref 3.6–11.0)

## 2017-12-06 LAB — COMPREHENSIVE METABOLIC PANEL
ALT: 12 U/L (ref 0–44)
ANION GAP: 12 (ref 5–15)
AST: 22 U/L (ref 15–41)
Albumin: 4.1 g/dL (ref 3.5–5.0)
Alkaline Phosphatase: 96 U/L (ref 38–126)
BUN: 11 mg/dL (ref 6–20)
CHLORIDE: 102 mmol/L (ref 98–111)
CO2: 26 mmol/L (ref 22–32)
Calcium: 9.4 mg/dL (ref 8.9–10.3)
Creatinine, Ser: 0.74 mg/dL (ref 0.44–1.00)
Glucose, Bld: 108 mg/dL — ABNORMAL HIGH (ref 70–99)
POTASSIUM: 3.4 mmol/L — AB (ref 3.5–5.1)
Sodium: 140 mmol/L (ref 135–145)
TOTAL PROTEIN: 8 g/dL (ref 6.5–8.1)
Total Bilirubin: 0.6 mg/dL (ref 0.3–1.2)

## 2017-12-06 NOTE — Progress Notes (Signed)
[facility] OFFICE PROGRESS NOTE  Patient Care Team: Lada, Melinda P, MD as PCP - General (Family Medicine) Byrnett, Jeffrey W, MD (General Surgery)  Cancer Staging No matching staging information was found for the patient.   Oncology History   1. Carcinoma of breast (right) upper and outer quadrant.  Ultrasound guided biopsy, date Oct 23, 2010. Invasive LOBULAR CANCER, Estrogen receptor positive, progesterone receptor positive, HER-2 receptor negative, by FISH. 2. Status post mastectomy [Dr.sankar] in June of 2012. T2 N0, M0 tumor (size of the tumor is close to 5 cm) 3. Started on chemotherapy with Cytoxan and Taxotere, July of 2012. 4. Finished Cytoxan, Taxotere for 4 cycles, September of 2012; NO RT.  5. Started on Femara October of 2012. 6. Patient discontinued Femara due to joint pain, Feb. 2014. 7. Started Tamoxifen, March 2014. 8.  Patient did not take any tamoxifen 9.  Started on Anastrazole from June of 2016;  # Aromasinm[June 2017] # June 28th 2017- Breast cancer index- LOW risk of recurrence [3.6%- 5-10 years]; STOP Aromasin Sep 2017.   # Genetic testing- My risk extended panel NEG  # AUG 2018- BMD- Osteoporosis- ca+ vit D BIDT score- = - 1.5 ---------------------------------------------------------   DIAGNOSIS [2012] BREAST CA ER/PR-POS; her 2 NEG  STAGE:   II      ;GOALS: Cure  CURRENT/MOST RECENT THERAPY: Surveillance     Carcinoma of upper-outer quadrant of right breast in female, estrogen receptor positive (HCC)      INTERVAL HISTORY:  Shirley Blackwell 62 y.o.  female pleasant patient above history of early stage breast cancer currently on surveillance is here for follow-up.  Patient states that she had "broken her ribs" with minimal trauma on 2 or 3 occasions.  Patient is not currently on any anti-osteoporotic.  Patient is on calcium; not taking vitamin D.  Patient has not had any trauma.  Chronic mild back pain not any worse.  No lumps or  bumps.  Review of Systems  Constitutional: Negative for chills, diaphoresis, fever, malaise/fatigue and weight loss.  HENT: Negative for nosebleeds and sore throat.   Eyes: Negative for double vision.  Respiratory: Negative for cough, hemoptysis, sputum production, shortness of breath and wheezing.   Cardiovascular: Positive for chest pain. Negative for palpitations, orthopnea and leg swelling.  Gastrointestinal: Negative for abdominal pain, blood in stool, constipation, diarrhea, heartburn, melena, nausea and vomiting.  Genitourinary: Negative for dysuria, frequency and urgency.  Musculoskeletal: Positive for back pain and joint pain.  Skin: Negative.  Negative for itching and rash.  Neurological: Negative for dizziness, tingling, focal weakness, weakness and headaches.  Endo/Heme/Allergies: Does not bruise/bleed easily.  Psychiatric/Behavioral: Negative for depression. The patient is not nervous/anxious and does not have insomnia.       PAST MEDICAL HISTORY :  Past Medical History:  Diagnosis Date  . Breast cancer (HCC) 2012   Right breast- chemo  . 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 Cancer of right breast (HCC)    right mastectomy invasive lobular CA,   . Colon cancer (HCC)    Pre cancerous polyps  . Hepatitis   . Hx of lumpectomy 2012   right  . Hypertension 1999  . Personal history of malignant neoplasm of breast 2012   R Mastectomy,  . Prediabetes 12/06/2014  . Shingles     PAST SURGICAL HISTORY :   Past Surgical History:  Procedure Laterality Date  . BREAST BIOPSY   Left    core bx- neg  . 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  0/10 lymph nodes.  . COLONOSCOPY  2013, 2014   Dr. Jamal Collin  . KNEE SURGERY Left 2014  . MASTECTOMY Right 2012   invasive lobular CA,    FAMILY HISTORY :   Family History  Problem Relation Age of Onset  .  Cancer Sister        Shirley Blackwell  . Diabetes Mother   . Hypertension Mother   . Kidney disease Mother   . Hypertension Father   . Diabetes Maternal Grandmother   . Heart failure Maternal Grandmother   . Heart failure Paternal Grandfather   . Breast cancer Neg Hx     SOCIAL HISTORY:   Social History   Tobacco Use  . Smoking status: Never Smoker  . Smokeless tobacco: Never Used  Substance Use Topics  . Alcohol use: No  . Drug use: No    ALLERGIES:  is allergic to sulfa antibiotics and tape.  MEDICATIONS:  Current Outpatient Medications  Medication Sig Dispense Refill  . amLODipine-benazepril (LOTREL) 5-10 MG capsule TAKE 1 CAPSULE BY MOUTH DAILY. 90 capsule 3  . diphenhydrAMINE (BENADRYL) 25 MG tablet Take 50 mg by mouth daily.     . potassium chloride (KLOR-CON 10) 10 MEQ tablet Take 1 tablet (10 mEq total) by mouth once a week. 12 tablet 1  . triamterene-hydrochlorothiazide (MAXZIDE-25) 37.5-25 MG tablet Take 0.5 tablets by mouth daily. (appointment needed for further refills please) 45 tablet 0   No current facility-administered medications for this visit.     PHYSICAL EXAMINATION: ECOG PERFORMANCE STATUS: 1 - Symptomatic but completely ambulatory  BP 115/77 (BP Location: Left Arm, Patient Position: Sitting)   Pulse 77   Temp 97.6 F (36.4 C) (Tympanic)   Resp 16   Wt 190 lb 9.6 oz (86.5 kg)   BMI 29.85 kg/m   Filed Weights   12/06/17 1420  Weight: 190 lb 9.6 oz (86.5 kg)    GENERAL: Well-nourished well-developed; Alert, no distress and comfortable.  accompanied by family.  EYES: no pallor or icterus OROPHARYNX: no thrush or ulceration; NECK: supple; no lymph nodes felt. LYMPH:  no palpable lymphadenopathy in the axillary or inguinal regions LUNGS: Decreased breath sounds auscultation bilaterally. No wheeze or crackles HEART/CVS: regular rate & rhythm and no murmurs; No lower extremity edema ABDOMEN:abdomen soft, non-tender and normal bowel sounds. No  hepatomegaly or splenomegaly.  Musculoskeletal:no cyanosis of digits and no clubbing  PSYCH: alert & oriented x 3 with fluent speech NEURO: no focal motor/sensory deficits SKIN:  no rashes or significant lesions    LABORATORY DATA:  I have reviewed the data as listed    Component Value Date/Time   NA 140 12/06/2017 1353   NA 141 12/14/2014 1117   NA 143 09/07/2014 1527   K 3.4 (L) 12/06/2017 1353   K 3.4 (L) 09/07/2014 1527   CL 102 12/06/2017 1353   CL 105 09/07/2014 1527   CO2 26 12/06/2017 1353   CO2 28 09/07/2014 1527   GLUCOSE 108 (H) 12/06/2017 1353   GLUCOSE 113 (H) 09/07/2014 1527   BUN 11 12/06/2017 1353   BUN 10 12/14/2014 1117   BUN 13 09/07/2014 1527   CREATININE 0.74 12/06/2017 1353   CREATININE 0.78 11/12/2017 1016   CALCIUM 9.4 12/06/2017 1353   CALCIUM 9.4 09/07/2014 1527   PROT 8.0 12/06/2017 1353   PROT 7.4 12/14/2014  1117   PROT 8.2 (H) 09/07/2014 1527   ALBUMIN 4.1 12/06/2017 1353   ALBUMIN 4.2 12/14/2014 1117   ALBUMIN 4.1 09/07/2014 1527   AST 22 12/06/2017 1353   AST 26 09/07/2014 1527   ALT 12 12/06/2017 1353   ALT 25 09/07/2014 1527   ALKPHOS 96 12/06/2017 1353   ALKPHOS 84 09/07/2014 1527   BILITOT 0.6 12/06/2017 1353   BILITOT 0.4 12/14/2014 1117   BILITOT 0.4 09/07/2014 1527   GFRNONAA >60 12/06/2017 1353   GFRNONAA 83 11/12/2017 1016   GFRAA >60 12/06/2017 1353   GFRAA 96 11/12/2017 1016    No results found for: SPEP, UPEP  Lab Results  Component Value Date   WBC 5.6 12/06/2017   NEUTROABS 2.7 12/06/2017   HGB 14.2 12/06/2017   HCT 41.6 12/06/2017   MCV 84.9 12/06/2017   PLT 300 12/06/2017      Chemistry      Component Value Date/Time   NA 140 12/06/2017 1353   NA 141 12/14/2014 1117   NA 143 09/07/2014 1527   K 3.4 (L) 12/06/2017 1353   K 3.4 (L) 09/07/2014 1527   CL 102 12/06/2017 1353   CL 105 09/07/2014 1527   CO2 26 12/06/2017 1353   CO2 28 09/07/2014 1527   BUN 11 12/06/2017 1353   BUN 10 12/14/2014 1117    BUN 13 09/07/2014 1527   CREATININE 0.74 12/06/2017 1353   CREATININE 0.78 11/12/2017 1016      Component Value Date/Time   CALCIUM 9.4 12/06/2017 1353   CALCIUM 9.4 09/07/2014 1527   ALKPHOS 96 12/06/2017 1353   ALKPHOS 84 09/07/2014 1527   AST 22 12/06/2017 1353   AST 26 09/07/2014 1527   ALT 12 12/06/2017 1353   ALT 25 09/07/2014 1527   BILITOT 0.6 12/06/2017 1353   BILITOT 0.4 12/14/2014 1117   BILITOT 0.4 09/07/2014 1527       RADIOGRAPHIC STUDIES: I have personally reviewed the radiological images as listed and agreed with the findings in the report. No results found.   ASSESSMENT & PLAN:  Carcinoma of upper-outer quadrant of right breast in female, estrogen receptor positive ([place]) # LOBULAR Breast cancer ER/PR pos her 2 Neu Neg STAGE II; stop anastrozole 2018.  Currently on surveillance.  No clinical evidence of recurrence stable.  Recent mammogram evaluation with Dr. Bary Castilla normal.  #Rib fractures/minimal trauma-likely secondary to osteoporosis; May 2018 T score -1.5.  Discussed at length regarding potential risk of worsening osteoporosis; recommend increasing calcium vitamin D twice a day; exercise and also adding bisphosphonates.  Discussed regarding Prolia.  Discussed potential hypocalcemia and risk of osteonecrosis of jaw.  I recommend Prolia given benefits outweighing risk.  # will call re: ca-27-29; set up prolia- will call pt to set up.   # 25 minutes face-to-face with the patient discussing the above plan of care; more than 50% of time spent on prognosis/ natural history; counseling and coordination.   Cc; Dr.lada.   No orders of the defined types were placed in this encounter.  All questions were answered. The patient knows to call the clinic with any problems, questions or concerns.      Cammie Sickle, MD 12/07/2017 11:15 PM

## 2017-12-06 NOTE — Assessment & Plan Note (Addendum)
#   LOBULAR Breast cancer ER/PR pos her 2 Neu Neg STAGE II; stop anastrozole 2018.  Currently on surveillance.  No clinical evidence of recurrence stable.  Recent mammogram evaluation with Dr. Byrnett normal.  #Rib fractures/minimal trauma-likely secondary to osteoporosis; May 2018 T score -1.5.  Discussed at length regarding potential risk of worsening osteoporosis; recommend increasing calcium vitamin D twice a day; exercise and also adding bisphosphonates.  Discussed regarding Prolia.  Discussed potential hypocalcemia and risk of osteonecrosis of jaw.  I recommend Prolia given benefits outweighing risk.  # will call re: ca-27-29; set up prolia- will call pt to set up.   # 25 minutes face-to-face with the patient discussing the above plan of care; more than 50% of time spent on prognosis/ natural history; counseling and coordination.   Cc; Dr.lada. 

## 2017-12-07 ENCOUNTER — Telehealth: Payer: Self-pay | Admitting: Internal Medicine

## 2017-12-07 DIAGNOSIS — Z853 Personal history of malignant neoplasm of breast: Secondary | ICD-10-CM

## 2017-12-07 DIAGNOSIS — T386X5A Adverse effect of antigonadotrophins, antiestrogens, antiandrogens, not elsewhere classified, initial encounter: Principal | ICD-10-CM

## 2017-12-07 DIAGNOSIS — M818 Other osteoporosis without current pathological fracture: Secondary | ICD-10-CM | POA: Insufficient documentation

## 2017-12-07 LAB — CANCER ANTIGEN 27.29: CAN 27.29: 14.4 U/mL (ref 0.0–38.6)

## 2017-12-07 NOTE — Telephone Encounter (Signed)
I have ordered Prolia for the patient please check with insurance; and if approved set up for Prolia injection.  # Schedule the patient for follow-up with me in 7 months/labs CBC CMP CA 27-29/prolia.  Thx, GB

## 2017-12-09 NOTE — Addendum Note (Signed)
Addended by: Renita Papa R on: 12/09/2017 11:45 AM   Modules accepted: Orders

## 2017-12-11 ENCOUNTER — Telehealth: Payer: Self-pay | Admitting: *Deleted

## 2017-12-11 NOTE — Telephone Encounter (Signed)
Incoming - patient has declined prolia or any similar medication.

## 2017-12-13 NOTE — Telephone Encounter (Signed)
Patient refused Prolia

## 2017-12-18 NOTE — Telephone Encounter (Signed)
Another message left for patient to call to schedule her colonoscopy.

## 2017-12-22 ENCOUNTER — Other Ambulatory Visit: Payer: Self-pay | Admitting: Family Medicine

## 2017-12-24 ENCOUNTER — Telehealth: Payer: Self-pay | Admitting: Internal Medicine

## 2017-12-24 NOTE — Telephone Encounter (Signed)
Pt declined prolia/bisphosphonate therapy

## 2018-05-26 ENCOUNTER — Other Ambulatory Visit: Payer: Self-pay | Admitting: Family Medicine

## 2018-06-10 ENCOUNTER — Other Ambulatory Visit: Payer: Self-pay

## 2018-06-10 DIAGNOSIS — Z1231 Encounter for screening mammogram for malignant neoplasm of breast: Secondary | ICD-10-CM

## 2018-06-15 ENCOUNTER — Telehealth: Payer: Self-pay | Admitting: Family Medicine

## 2018-06-16 NOTE — Telephone Encounter (Signed)
Called [phone] @ 3:28 left voice message asking pt to return call to schedule appt within next few weeks and to come fasting. Also informed pt that a couple of prescriptions have been sent to pharmacy.

## 2018-06-16 NOTE — Telephone Encounter (Signed)
Please ask patient to schedule a visit in the next few weeks for her prediabetes and hypertension I'll refill med as requested We'll get labs at her appointment; ask her to come FASTING since her last TG level was high  Lab Results  Component Value Date   CHOL 236 (H) 11/12/2017   HDL 46 (L) 11/12/2017   LDLCALC 152 (H) 11/12/2017   TRIG 224 (H) 11/12/2017   CHOLHDL 5.1 (H) 11/12/2017   Lab Results  Component Value Date   K 3.4 (L) 12/06/2017   Lab Results  Component Value Date   CREATININE 0.74 12/06/2017    Thank you

## 2018-07-16 ENCOUNTER — Ambulatory Visit
Admission: RE | Admit: 2018-07-16 | Discharge: 2018-07-16 | Disposition: A | Discharge status: Home / Self Care | Payer: BLUE CROSS/BLUE SHIELD | Source: Ambulatory Visit | Attending: Surgery | Admitting: Surgery

## 2018-07-16 DIAGNOSIS — Z1231 Encounter for screening mammogram for malignant neoplasm of breast: Secondary | ICD-10-CM | POA: Diagnosis not present

## 2018-07-18 ENCOUNTER — Inpatient Hospital Stay: Payer: BLUE CROSS/BLUE SHIELD

## 2018-07-18 ENCOUNTER — Ambulatory Visit: Payer: BLUE CROSS/BLUE SHIELD

## 2018-07-18 ENCOUNTER — Inpatient Hospital Stay: Payer: BLUE CROSS/BLUE SHIELD | Admitting: Internal Medicine

## 2018-07-18 NOTE — Progress Notes (Unsigned)
[facility] OFFICE PROGRESS NOTE  Patient Care Team: Lada, Satira Anis, MD as PCP - General (Family Medicine) Bary Castilla Forest Gleason, MD (General Surgery)  Cancer Staging No matching staging information was found for the patient.   Oncology History   1. Carcinoma of breast (right) upper and outer quadrant.  Ultrasound guided biopsy, date Oct 23, 2010. Invasive LOBULAR CANCER, Estrogen receptor positive, progesterone receptor positive, HER-2 receptor negative, by FISH. 2. Status post mastectomy [Dr.sankar] in June of 2012. T2 N0, M0 tumor (size of the tumor is close to 5 cm) 3. Started on chemotherapy with Cytoxan and Taxotere, July of 2012. 4. Finished Cytoxan, Taxotere for 4 cycles, September of 2012; NO RT.  5. Started on Femara October of 2012. 6. Patient discontinued Femara due to joint pain, Feb. 2014. 7. Started Tamoxifen, March 2014. 8.  Patient did not take any tamoxifen 9.  Started on [place] from June of 2016;  # Aromasinm[June 2017] # June 28th 2017- Breast cancer index- LOW risk of recurrence [3.6%- 5-10 years]; STOP Aromasin Sep 2017.   # Genetic testing- My risk extended panel NEG  # AUG 2018- BMD- Osteoporosis- ca+ vit D BIDT score- = - 1.5 ---------------------------------------------------------   DIAGNOSIS [2012] BREAST CA ER/PR-POS; her 2 NEG  STAGE:   II      ;GOALS: Cure  CURRENT/MOST RECENT THERAPY: Surveillance     Carcinoma of upper-outer quadrant of right breast in female, estrogen receptor positive ([place])      INTERVAL HISTORY:  Shirley Blackwell 63 y.o.  female pleasant patient above history of early stage breast cancer currently on surveillance is here for follow-up.  Patient states that she had "broken her ribs" with minimal trauma on 2 or 3 occasions.  Patient is not currently on any anti-osteoporotic.  Patient is on calcium; not taking vitamin D.  Patient has not had any trauma.  Chronic mild back pain not any worse.  No lumps or  bumps.  Review of Systems  Constitutional: Negative for chills, diaphoresis, fever, malaise/fatigue and weight loss.  HENT: Negative for nosebleeds and sore throat.   Eyes: Negative for double vision.  Respiratory: Negative for cough, hemoptysis, sputum production, shortness of breath and wheezing.   Cardiovascular: Positive for chest pain. Negative for palpitations, orthopnea and leg swelling.  Gastrointestinal: Negative for abdominal pain, blood in stool, constipation, diarrhea, heartburn, melena, nausea and vomiting.  Genitourinary: Negative for dysuria, frequency and urgency.  Musculoskeletal: Positive for back pain and joint pain.  Skin: Negative.  Negative for itching and rash.  Neurological: Negative for dizziness, tingling, focal weakness, weakness and headaches.  Endo/Heme/Allergies: Does not bruise/bleed easily.  Psychiatric/Behavioral: Negative for depression. The patient is not nervous/anxious and does not have insomnia.       PAST MEDICAL HISTORY :  Past Medical History:  Diagnosis Date  . Breast cancer [facility]) 2012   Right breast- chemo  . Cancer [facility]) 2012   right mastectomy invasive lobular CA, 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  . Cancer of right breast ([place])    right mastectomy invasive lobular CA,   . Colon cancer ([place])    Pre cancerous polyps  . Hepatitis   . Hx of lumpectomy 2012   right  . Hypertension 1999  . Personal history of malignant neoplasm of breast 2012   R Mastectomy,  . Prediabetes 12/06/2014  . Shingles     PAST SURGICAL HISTORY :   Past Surgical History:  Procedure Laterality Date  . BREAST BIOPSY  Left 2000, 2002   core bx- neg  . 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  0/10 lymph nodes.  . COLONOSCOPY  2013, 2014   Dr. Jamal Collin  . KNEE SURGERY Left 2014  . MASTECTOMY Right 2012   invasive lobular CA,    FAMILY HISTORY :   Family History  Problem Relation Age of  Onset  . Cancer Sister        Theadora Rama  . Diabetes Mother   . Hypertension Mother   . Kidney disease Mother   . Hypertension Father   . Diabetes Maternal Grandmother   . Heart failure Maternal Grandmother   . Heart failure Paternal Grandfather   . Breast cancer Neg Hx     SOCIAL HISTORY:   Social History   Tobacco Use  . Smoking status: Never Smoker  . Smokeless tobacco: Never Used  Substance Use Topics  . Alcohol use: No  . Drug use: No    ALLERGIES:  is allergic to sulfa antibiotics and tape.  MEDICATIONS:  Current Outpatient Medications  Medication Sig Dispense Refill  . amLODipine-benazepril (LOTREL) 5-10 MG capsule TAKE 1 CAPSULE BY MOUTH DAILY. 90 capsule 1  . diphenhydrAMINE (BENADRYL) 25 MG tablet Take 50 mg by mouth daily.     . potassium chloride (K-DUR) 10 MEQ tablet TAKE 1 TABLET (10 MEQ TOTAL) BY MOUTH ONCE A WEEK. 12 tablet 1  . triamterene-hydrochlorothiazide (MAXZIDE-25) 37.5-25 MG tablet TAKE 0.5 TABLETS BY MOUTH DAILY. 45 tablet 1   No current facility-administered medications for this visit.     PHYSICAL EXAMINATION: ECOG PERFORMANCE STATUS: 1 - Symptomatic but completely ambulatory  There were no vitals taken for this visit.  There were no vitals filed for this visit.  GENERAL: Well-nourished well-developed; Alert, no distress and comfortable.  accompanied by family.  EYES: no pallor or icterus OROPHARYNX: no thrush or ulceration; NECK: supple; no lymph nodes felt. LYMPH:  no palpable lymphadenopathy in the axillary or inguinal regions LUNGS: Decreased breath sounds auscultation bilaterally. No wheeze or crackles HEART/CVS: regular rate & rhythm and no murmurs; No lower extremity edema ABDOMEN:abdomen soft, non-tender and normal bowel sounds. No hepatomegaly or splenomegaly.  Musculoskeletal:no cyanosis of digits and no clubbing  PSYCH: alert & oriented x 3 with fluent speech NEURO: no focal motor/sensory deficits SKIN:  no rashes or  significant lesions    LABORATORY DATA:  I have reviewed the data as listed    Component Value Date/Time   NA 140 12/06/2017 1353   NA 141 12/14/2014 1117   NA 143 09/07/2014 1527   K 3.4 (L) 12/06/2017 1353   K 3.4 (L) 09/07/2014 1527   CL 102 12/06/2017 1353   CL 105 09/07/2014 1527   CO2 26 12/06/2017 1353   CO2 28 09/07/2014 1527   GLUCOSE 108 (H) 12/06/2017 1353   GLUCOSE 113 (H) 09/07/2014 1527   BUN 11 12/06/2017 1353   BUN 10 12/14/2014 1117   BUN 13 09/07/2014 1527   CREATININE 0.74 12/06/2017 1353   CREATININE 0.78 11/12/2017 1016   CALCIUM 9.4 12/06/2017 1353   CALCIUM 9.4 09/07/2014 1527   PROT 8.0 12/06/2017 1353   PROT 7.4 12/14/2014 1117   PROT 8.2 (H) 09/07/2014 1527   ALBUMIN 4.1 12/06/2017 1353   ALBUMIN 4.2 12/14/2014 1117   ALBUMIN 4.1 09/07/2014 1527   AST 22 12/06/2017 1353   AST 26 09/07/2014 1527   ALT 12 12/06/2017 1353  ALT 25 09/07/2014 1527   ALKPHOS 96 12/06/2017 1353   ALKPHOS 84 09/07/2014 1527   BILITOT 0.6 12/06/2017 1353   BILITOT 0.4 12/14/2014 1117   BILITOT 0.4 09/07/2014 1527   GFRNONAA >60 12/06/2017 1353   GFRNONAA 83 11/12/2017 1016   GFRAA >60 12/06/2017 1353   GFRAA 96 11/12/2017 1016    No results found for: SPEP, UPEP  Lab Results  Component Value Date   WBC 5.6 12/06/2017   NEUTROABS 2.7 12/06/2017   HGB 14.2 12/06/2017   HCT 41.6 12/06/2017   MCV 84.9 12/06/2017   PLT 300 12/06/2017      Chemistry      Component Value Date/Time   NA 140 12/06/2017 1353   NA 141 12/14/2014 1117   NA 143 09/07/2014 1527   K 3.4 (L) 12/06/2017 1353   K 3.4 (L) 09/07/2014 1527   CL 102 12/06/2017 1353   CL 105 09/07/2014 1527   CO2 26 12/06/2017 1353   CO2 28 09/07/2014 1527   BUN 11 12/06/2017 1353   BUN 10 12/14/2014 1117   BUN 13 09/07/2014 1527   CREATININE 0.74 12/06/2017 1353   CREATININE 0.78 11/12/2017 1016      Component Value Date/Time   CALCIUM 9.4 12/06/2017 1353   CALCIUM 9.4 09/07/2014 1527    ALKPHOS 96 12/06/2017 1353   ALKPHOS 84 09/07/2014 1527   AST 22 12/06/2017 1353   AST 26 09/07/2014 1527   ALT 12 12/06/2017 1353   ALT 25 09/07/2014 1527   BILITOT 0.6 12/06/2017 1353   BILITOT 0.4 12/14/2014 1117   BILITOT 0.4 09/07/2014 1527       RADIOGRAPHIC STUDIES: I have personally reviewed the radiological images as listed and agreed with the findings in the report. Mm 3d Screen Breast Uni Left  Result Date: 07/16/2018 CLINICAL DATA:  Screening. Patient has a history of right breast cancer status post mastectomy. Diagnostic mammogram on 11/12/2016 recommended stereotactic biopsy of left breast calcifications. The ordering physician as well as the patient had elected to observe rather than biopsy. EXAM: DIGITAL SCREENING UNILATERAL LEFT MAMMOGRAM WITH CAD AND TOMO COMPARISON:  Previous exam(s). ACR Breast Density Category c: The breast tissue is heterogeneously dense, which may obscure small masses. FINDINGS: Diffuse calcifications in the left breast are stable compared to prior exams. No suspicious mass identified. Images were processed with CAD. IMPRESSION: Stable left breast calcifications. RECOMMENDATION: Screening mammogram in one year.  (Code:SM-L-12M) BI-RADS CATEGORY  2: Benign. Electronically Signed   By: Dina  Arceo M.D.   On: 07/16/2018 12:09     ASSESSMENT & PLAN:  No problem-specific Assessment & Plan notes found for this encounter.   No orders of the defined types were placed in this encounter.  All questions were answered. The patient knows to call the clinic with any problems, questions or concerns.      Govinda R Brahmanday, MD 07/18/2018 8:24 AM  

## 2018-07-18 NOTE — Assessment & Plan Note (Signed)
#   LOBULAR Breast cancer ER/PR pos her 2 Neu Neg STAGE II; stop anastrozole 2018.  Currently on surveillance.  No clinical evidence of recurrence stable.  Recent mammogram evaluation with Dr. Bary Castilla normal.  #Rib fractures/minimal trauma-likely secondary to osteoporosis; May 2018 T score -1.5.  Discussed at length regarding potential risk of worsening osteoporosis; recommend increasing calcium vitamin D twice a day; exercise and also adding bisphosphonates.  Discussed regarding Prolia.  Discussed potential hypocalcemia and risk of osteonecrosis of jaw.  I recommend Prolia given benefits outweighing risk.  # will call re: ca-27-29; set up prolia- will call pt to set up.   # 25 minutes face-to-face with the patient discussing the above plan of care; more than 50% of time spent on prognosis/ natural history; counseling and coordination.   Cc; Dr.lada.

## 2018-07-24 ENCOUNTER — Ambulatory Visit: Payer: BLUE CROSS/BLUE SHIELD | Admitting: General Surgery

## 2018-08-14 ENCOUNTER — Ambulatory Visit (INDEPENDENT_AMBULATORY_CARE_PROVIDER_SITE_OTHER): Payer: BLUE CROSS/BLUE SHIELD | Admitting: General Surgery

## 2018-08-14 ENCOUNTER — Encounter: Payer: Self-pay | Admitting: General Surgery

## 2018-08-14 ENCOUNTER — Other Ambulatory Visit: Payer: Self-pay

## 2018-08-14 VITALS — BP 115/80 | HR 72 | Temp 97.9°F | Resp 16 | Ht 67.0 in | Wt 189.2 lb

## 2018-08-14 DIAGNOSIS — Z86010 Personal history of colonic polyps: Secondary | ICD-10-CM

## 2018-08-14 DIAGNOSIS — Z1231 Encounter for screening mammogram for malignant neoplasm of breast: Secondary | ICD-10-CM

## 2018-08-14 MED ORDER — POLYETHYLENE GLYCOL 3350 17 GM/SCOOP PO POWD: ORAL | 0 refills | Status: DC

## 2018-08-14 NOTE — Progress Notes (Signed)
Patient ID: Shirley Blackwell, female   DOB: [birth year], 63 y.o.   MRN: [id number]  Chief Complaint  Patient presents with  . Follow-up    1 year screening mammogram    HPI FINESSE FIELDER is a 63 y.o. female.  Here today for her one year follow up right breast cancer and screening mammogram 07-15-17. No new breast complaints. The patient's father, 65 years old, was recently diagnosed with metastatic prostate cancer.  The patient is the primary caregiver for both parents.  The patient had been encouraged to make use of Prolia to manage her osteoporosis.  After reviewing the literature she declined this.  Presently taking calcium and vitamin D.  HPI  Past Medical History:  Diagnosis Date  . Breast cancer [facility]) 2012   Right breast- chemo  . 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 Cancer of right breast ([place])    right mastectomy invasive lobular CA,   . Colon cancer ([place])    Pre cancerous polyps  . Hepatitis   . Hx of lumpectomy 2012   right  . Hypertension 1999  . Personal history of malignant neoplasm of breast 2012   R Mastectomy,  . Prediabetes 12/06/2014  . Shingles     Past Surgical History:  Procedure Laterality Date  . BREAST BIOPSY Left 2000, 2002   core bx- neg  . 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  0/10 lymph nodes.  . COLONOSCOPY  2013, 2014   Dr. Jamal Collin  . KNEE SURGERY Left 2014  . MASTECTOMY Right 2012   invasive lobular CA,    Family History  Problem Relation Age of Onset  . Cancer Sister        Theadora Rama  . Diabetes Mother   . Hypertension Mother   . Kidney disease Mother   . Hypertension Father   . Diabetes Maternal Grandmother   . Heart failure Maternal Grandmother   . Heart failure Paternal Grandfather   . Breast cancer Neg Hx     Social History Social History   Tobacco Use  . Smoking status: Never Smoker  . Smokeless  tobacco: Never Used  Substance Use Topics  . Alcohol use: No  . Drug use: No    Allergies  Allergen Reactions  . Macrobid [Nitrofurantoin Macrocrystal]   . Sulfa Antibiotics Hives    Other reaction(s): Hives  . Tape Other (See Comments)    blisters    Current Outpatient Medications  Medication Sig Dispense Refill  . amLODipine-benazepril (LOTREL) 5-10 MG capsule TAKE 1 CAPSULE BY MOUTH DAILY. 90 capsule 1  . diphenhydrAMINE (BENADRYL) 25 MG tablet Take 50 mg by mouth daily.     . potassium chloride (K-DUR) 10 MEQ tablet TAKE 1 TABLET (10 MEQ TOTAL) BY MOUTH ONCE A WEEK. 12 tablet 1  . triamterene-hydrochlorothiazide (MAXZIDE-25) 37.5-25 MG tablet TAKE 0.5 TABLETS BY MOUTH DAILY. 45 tablet 1  . polyethylene glycol powder (GLYCOLAX/MIRALAX) powder 255 grams one bottle for colonoscopy prep 255 g 0   No current facility-administered medications for this visit.     Review of Systems Review of Systems  Constitutional: Negative.   Respiratory: Negative.   Cardiovascular: Negative.     Blood pressure 115/80, pulse 72, temperature 97.9 F (36.6 C), temperature source Temporal, resp. rate 16, height 5\' 7"  (1.702 m), weight 189 lb 3.2 oz (85.8 kg), SpO2 96 %.  Physical Exam  Physical Exam Exam conducted with a chaperone present.  Constitutional:      Appearance: She is well-developed.  Eyes:     General: No scleral icterus.    Conjunctiva/sclera: Conjunctivae normal.  Neck:     Musculoskeletal: Normal range of motion and neck supple.  Cardiovascular:     Rate and Rhythm: Normal rate and regular rhythm.     Heart sounds: Normal heart sounds.  Pulmonary:     Effort: Pulmonary effort is normal.     Breath sounds: Normal breath sounds.  Chest:     Breasts:        Left: No inverted nipple, mass, nipple discharge, skin change or tenderness.    Lymphadenopathy:     Cervical: No cervical adenopathy.     Right cervical: No posterior cervical adenopathy.    Upper Body:      Right upper body: No supraclavicular or axillary adenopathy.     Left upper body: No supraclavicular or axillary adenopathy.  Skin:    General: Skin is warm and dry.  Neurological:     Mental Status: She is alert and oriented to person, place, and time.  Psychiatric:        Behavior: Behavior normal.     Data Reviewed Left screening mammogram dated July 16, 2018 reviewed: BI-RADS-2. 2014 colonoscopy was negative.  Patient previously undergone a right hemicolectomy for a 7 cm tubulovillous adenoma.  Assessment No evidence of recurrent breast cancer.  Candidate for follow-up colonoscopy.  Plan  She will consider Boniva, discuss with Dr Rogue Bussing at her next follow-up visit.  The patient has been asked to return to the office in one year with a unilateral left breast screening mammogram.   Colonoscopy with possible biopsy/polypectomy prn: Information regarding the procedure, including its potential risks and complications (including but not limited to perforation of the bowel, which may require emergency surgery to repair, and bleeding) was verbally given to the patient. Educational information regarding lower intestinal endoscopy was given to the patient. Written instructions for how to complete the bowel prep using Miralax were provided. The importance of drinking ample fluids to avoid dehydration as a result of the prep emphasized.    HPI, assessment, plan and physical exam has been scribed under the direction and in the presence of Robert Bellow, MD. Karie Fetch, RN  HPI, Physical Exam, Assessment and Plan have been scribed under the direction and in the presence of Robert Bellow, MD. Jonnie Finner, CMA  I have completed the exam and reviewed the above documentation for accuracy and completeness.  I agree with the above.  Haematologist has been used and any errors in dictation or transcription are unintentional.  Hervey Ard, M.D., F.A.C.S.  Forest Gleason Byrnett 08/15/2018, 3:03 PM  Patient has been scheduled for a colonoscopy on 09-17-18 at [facility]. Miralax prescription has been sent in to the patient's pharmacy today. Colonoscopy instructions have been reviewed with the patient. This patient is aware to call the office if they have further questions.   Dominga Ferry, CMA

## 2018-08-14 NOTE — Patient Instructions (Addendum)
The patient is aware to call back for any questions or new concerns. The patient has been asked to return to the office in one year with a unilateral left breast screening mammogram.     She will consider Boniva.  Boniva /  Ibandronate injection What is this medicine? IBANDRONATE (i BAN droh nate) slows calcium loss from bones. It is used to treat osteoporosis in women past the age of menopause. This medicine may be used for other purposes; ask your health care provider or pharmacist if you have questions. COMMON BRAND NAME(S): Boniva What should I tell my health care provider before I take this medicine? They need to know if you have any of these conditions: -dental disease -kidney disease -low levels of calcium in the blood -low levels of vitamin D in the blood -an unusual or allergic reaction to ibandronate, other medicines, foods, dyes, or preservatives -pregnant or trying to get pregnant -breast-feeding How should I use this medicine? This medicine is for injection into a vein. It is given by a health care professional in a hospital or clinic setting. Talk to your pediatrician regarding the use of this medicine in children. Special care may be needed. Overdosage: If you think you have taken too much of this medicine contact a poison control center or emergency room at once. NOTE: This medicine is only for you. Do not share this medicine with others. What if I miss a dose? It is important not to miss your dose. Call your doctor or health care professional if you are unable to keep an appointment. What may interact with this medicine? -teriparatide This list may not describe all possible interactions. Give your health care provider a list of all the medicines, herbs, non-prescription drugs, or dietary supplements you use. Also tell them if you smoke, drink alcohol, or use illegal drugs. Some items may interact with your medicine. What should I watch for while using this  medicine? Visit your doctor or health care professional for regular check ups. It may be some time before you see the benefit from this medicine. Do not stop taking your medicine except on your doctor's advice. Your doctor or health care professional may order blood tests and other tests to see how you are doing. You should make sure you get enough calcium and vitamin D while you are taking this medicine, unless your doctor tells you not to. Discuss the foods you eat and the vitamins you take with your health care professional. Some people who take this medicine have severe bone, joint, and/or muscle pain. This medicine may also increase your risk for a broken thigh bone. Tell your doctor right away if you have pain in your upper leg or groin. Tell your doctor if you have any pain that does not go away or that gets worse. What side effects may I notice from receiving this medicine? Side effects that you should report to your doctor or health care professional as soon as possible: -allergic reactions such as skin rash or itching, hives, swelling of the face, lips, throat, or tongue -changes in vision -chest pain -fever, flu-like symptoms -heartburn or stomach pain -jaw pain, especially after dental work Side effects that usually do not require medical attention (report to your doctor or health care professional if they continue or are bothersome): -bone, muscle or joint pain -diarrhea or constipation -eye pain or itching -headache -irritation at site where injected -nausea This list may not describe all possible side effects. Call your doctor for  medical advice about side effects. You may report side effects to FDA at 1-800-FDA-1088. Where should I keep my medicine? This drug is given in a hospital or clinic and will not be stored at home. NOTE: This sheet is a summary. It may not cover all possible information. If you have questions about this medicine, talk to your doctor, pharmacist, or  health care provider.  2019 Elsevier/Gold Standard (2010-11-24 09:02:20)   Colonoscopy, Adult A colonoscopy is an exam to look at the entire large intestine. During the exam, a lubricated, flexible tube that has a camera on the end of it is inserted into the anus and then passed into the rectum, colon, and other parts of the large intestine. You may have a colonoscopy as a part of normal colorectal screening or if you have certain symptoms, such as:  Lack of red blood cells (anemia).  Diarrhea that does not go away.  Abdominal pain.  Blood in your stool (feces). A colonoscopy can help screen for and diagnose medical problems, including:  Tumors.  Polyps.  Inflammation.  Areas of bleeding. Tell a health care provider about:  Any allergies you have.  All medicines you are taking, including vitamins, herbs, eye drops, creams, and over-the-counter medicines.  Any problems you or family members have had with anesthetic medicines.  Any blood disorders you have.  Any surgeries you have had.  Any medical conditions you have.  Any problems you have had passing stool. What are the risks? Generally, this is a safe procedure. However, problems may occur, including:  Bleeding.  A tear in the intestine.  A reaction to medicines given during the exam.  Infection (rare). What happens before the procedure? Eating and drinking restrictions Follow instructions from your health care provider about eating and drinking, which may include:  A few days before the procedure - follow a low-fiber diet. Avoid nuts, seeds, dried fruit, raw fruits, and vegetables.  1-3 days before the procedure - follow a clear liquid diet. Drink only clear liquids, such as clear broth or bouillon, black coffee or tea, clear juice, clear soft drinks or sports drinks, gelatin dessert, and popsicles. Avoid any liquids that contain red or purple dye.  On the day of the procedure - do not eat or drink  anything starting 2 hours before the procedure, or within the time period that your health care provider recommends. Up to 2 hours before the procedure, you may continue to drink clear liquids, such as water or clear fruit juice. Bowel prep If you were prescribed an oral bowel prep to clean out your colon:  Take it as told by your health care provider. Starting the day before your procedure, you will need to drink a large amount of medicated liquid. The liquid will cause you to have multiple loose stools until your stool is almost clear or light green.  If your skin or anus gets irritated from diarrhea, you may use these to relieve the irritation: ? Medicated wipes, such as adult wet wipes with aloe and vitamin E. ? A skin-soothing product like petroleum jelly.  If you vomit while drinking the bowel prep, take a break for up to 60 minutes and then begin the bowel prep again. If vomiting continues and you cannot take the bowel prep without vomiting, call your health care provider.  To clean out your colon, you may also be given: ? Laxative medicines. ? Instructions about how to use an enema. General instructions  Ask your health care provider  about: ? Changing or stopping your regular medicines or supplements. This is especially important if you are taking iron supplements, diabetes medicines, or blood thinners. ? Taking medicines such as aspirin and ibuprofen. These medicines can thin your blood. Do not take these medicines before the procedure if your health care provider tells you not to.  Plan to have someone take you home from the hospital or clinic. What happens during the procedure?   An IV may be inserted into one of your veins.  You will be given medicine to help you relax (sedative).  To reduce your risk of infection: ? Your health care team will wash or sanitize their hands. ? Your anal area will be washed with soap.  You will be asked to lie on your side with your knees  bent.  Your health care provider will lubricate a long, thin, flexible tube. The tube will have a camera and a light on the end.  The tube will be inserted into your anus.  The tube will be gently eased through your rectum and colon.  Air will be delivered into your colon to keep it open. You may feel some pressure or cramping.  The camera will be used to take images during the procedure.  A small tissue sample may be removed to be examined under a microscope (biopsy).  If small polyps are found, your health care provider may remove them and have them checked for cancer cells.  When the exam is done, the tube will be removed. The procedure may vary among health care providers and hospitals. What happens after the procedure?  Your blood pressure, heart rate, breathing rate, and blood oxygen level will be monitored until the medicines you were given have worn off.  Do not drive for 24 hours after the exam.  You may have a small amount of blood in your stool.  You may pass gas and have mild abdominal cramping or bloating due to the air that was used to inflate your colon during the exam.  It is up to you to get the results of your procedure. Ask your health care provider, or the department performing the procedure, when your results will be ready. Summary  A colonoscopy is an exam to look at the entire large intestine.  During a colonoscopy, a lubricated, flexible tube with a camera on the end of it is inserted into the anus and then passed into the colon and other parts of the large intestine.  Follow instructions from your health care provider about eating and drinking before the procedure.  If you were prescribed an oral bowel prep to clean out your colon, take it as told by your health care provider.  After your procedure, your blood pressure, heart rate, breathing rate, and blood oxygen level will be monitored until the medicines you were given have worn off. This  information is not intended to replace advice given to you by your health care provider. Make sure you discuss any questions you have with your health care provider. Document Released: 05/25/2000 Document Revised: 03/20/2017 Document Reviewed: 08/09/2015 Elsevier Interactive Patient Education  2019 [employer].

## 2018-08-28 ENCOUNTER — Telehealth: Payer: Self-pay | Admitting: *Deleted

## 2018-08-28 NOTE — Telephone Encounter (Signed)
C/x surgery , will call back to reschedule

## 2018-09-17 ENCOUNTER — Ambulatory Visit: Admit: 2018-09-17 | Payer: BLUE CROSS/BLUE SHIELD | Admitting: General Surgery

## 2018-09-17 SURGERY — COLONOSCOPY WITH PROPOFOL
Anesthesia: General

## 2018-10-21 ENCOUNTER — Telehealth: Payer: Self-pay | Admitting: *Deleted

## 2018-10-21 NOTE — Telephone Encounter (Signed)
Message left for patient to call the office.   We need to get colonoscopy rescheduled with Dr. Bary Castilla at [facility].

## 2018-11-04 ENCOUNTER — Other Ambulatory Visit: Payer: Self-pay | Admitting: General Surgery

## 2018-11-04 DIAGNOSIS — Z86010 Personal history of colonic polyps: Secondary | ICD-10-CM

## 2018-11-04 MED ORDER — POLYETHYLENE GLYCOL 3350 17 GM/SCOOP PO POWD: ORAL | 0 refills | Status: DC

## 2018-11-04 NOTE — Telephone Encounter (Signed)
I did call and was able to speak with this patient today.   Patient has been rescheduled for a colonoscopy on 02-04-19 at [facility]. Miralax prescription has been sent in to the patient's pharmacy today- [facility] in [place]. Colonoscopy instructions have been reviewed with the patient and will be mailed to the patient per her request. This patient is aware to call the office if she has further questions.   The patient is aware to have COVID-19 testing done on 01-29-19 at the [facility] drive thru ([address] [place]) between 10:30 am and 12:30 pm. She is aware to isolate after, have no visitors, wash hands frequently, and avoid touching face. Patient aware she will only be contacted if results are positive.

## 2018-11-12 ENCOUNTER — Other Ambulatory Visit: Payer: Self-pay | Admitting: Family Medicine

## 2018-11-12 NOTE — Telephone Encounter (Signed)
Please schedule patient for follow up in the next 30 days.  

## 2018-11-19 ENCOUNTER — Other Ambulatory Visit: Payer: Self-pay | Admitting: General Surgery

## 2018-12-07 ENCOUNTER — Other Ambulatory Visit: Payer: Self-pay | Admitting: Family Medicine

## 2018-12-09 ENCOUNTER — Ambulatory Visit (INDEPENDENT_AMBULATORY_CARE_PROVIDER_SITE_OTHER): Payer: BC Managed Care – PPO | Admitting: Family Medicine

## 2018-12-09 ENCOUNTER — Other Ambulatory Visit: Payer: Self-pay

## 2018-12-09 ENCOUNTER — Encounter: Payer: Self-pay | Admitting: Family Medicine

## 2018-12-09 VITALS — BP 126/84 | HR 100 | Temp 97.5°F | Resp 16 | Ht 67.0 in | Wt 191.2 lb

## 2018-12-09 DIAGNOSIS — I10 Essential (primary) hypertension: Secondary | ICD-10-CM

## 2018-12-09 DIAGNOSIS — T386X5A Adverse effect of antigonadotrophins, antiestrogens, antiandrogens, not elsewhere classified, initial encounter: Secondary | ICD-10-CM

## 2018-12-09 DIAGNOSIS — R7303 Prediabetes: Secondary | ICD-10-CM

## 2018-12-09 DIAGNOSIS — E782 Mixed hyperlipidemia: Secondary | ICD-10-CM

## 2018-12-09 DIAGNOSIS — Z853 Personal history of malignant neoplasm of breast: Secondary | ICD-10-CM | POA: Diagnosis not present

## 2018-12-09 DIAGNOSIS — M818 Other osteoporosis without current pathological fracture: Secondary | ICD-10-CM

## 2018-12-09 DIAGNOSIS — E876 Hypokalemia: Secondary | ICD-10-CM

## 2018-12-09 MED ORDER — POTASSIUM CHLORIDE ER 10 MEQ PO TBCR: 10.0000 meq | EXTENDED_RELEASE_TABLET | ORAL | 1 refills | Status: DC

## 2018-12-09 MED ORDER — TRIAMTERENE-HCTZ 37.5-25 MG PO TABS: 0.5000 | ORAL_TABLET | Freq: Every day | ORAL | 1 refills | Status: DC

## 2018-12-09 MED ORDER — AMLODIPINE BESY-BENAZEPRIL HCL 5-10 MG PO CAPS: ORAL_CAPSULE | ORAL | 1 refills | Status: DC

## 2018-12-09 NOTE — Progress Notes (Signed)
Name: Shirley Blackwell   MRN: [id number]    DOB: January 05, 1956   Date:12/09/2018       Progress Note  Subjective  Chief Complaint  Chief Complaint  Patient presents with  . Follow-up    1 month recheck    HPI  HTN:  -does take medications as prescribed - current regimen includes Lotrel and Maxzide-25 (1/2 tablet).  - taking medications as instructed, no medication side effects noted, no TIAs, no chest pain on exertion, no dyspnea on exertion, no swelling of ankles, no palpitations - DASH diet discussed - pt does follow a low sodium diet.  - Checking at home periodically - once every few months and is always in normal range.  Taking potassium 2-3 times a month also increased her orange juice intake.  She will get some leg aching and that's when she takes her potassium tablet.  We will check labs today.   Prediabetes: Endorses some polydipsia; no polyuria or polyphagia. We will check labs today. Wants to work on losing weight and try to do diet control at this time.   Breast CA: Diagnosed in 2012, had lumpectomy, then total mastectomy (RIGHT), did a few rounds of Chemo, then did 5 years of oral medication.  She returns annually for monitoring. She is very compliant with mammograms.   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  Last DEXA was 10/17/2016.   Caregiver Strain: Her father was diagnosed with stage IV prostate cancer with bone metastasis, mother is suffering from dementia.  Both parents are living in an RV on the patient's property.   HLD: Not on statin; will check today.  No chest pain or shortness of breath.    Office Visit from 12/09/2018 in [facility]  PHQ-9 Total Score  4     Patient Active Problem List   Diagnosis Date Noted  . 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 Overweight (BMI 25.0-29.9) 11/18/2017  . Osteopenia 11/18/2017  . Medication monitoring  encounter 09/22/2017  . Preventative health care 09/10/2016  . Screening for malignant neoplasm of cervix 09/10/2016  . Carcinoma of upper-outer quadrant of right breast in female, estrogen receptor positive ([place]) 12/23/2015  . Prediabetes 12/06/2014  . 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 HLD (hyperlipidemia) 12/06/2014  . 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 Stress at home 12/06/2014  . History of colonic polyps 10/02/2012  . Personal history of malignant neoplasm of breast   . Benign essential HTN 11/09/2006    Past Surgical History:  Procedure Laterality Date  . BREAST BIOPSY Left 2000, 2002   core bx- neg  . 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  0/10 lymph nodes.  . COLONOSCOPY  2013, 2014   Dr. Jamal Collin  . KNEE SURGERY Left 2014  . MASTECTOMY Right 2012   invasive lobular CA,    Family History  Problem Relation Age of Onset  . Cancer Sister        Theadora Rama  . Diabetes Mother   . Hypertension Mother   . Kidney disease Mother   . Hypertension Father   . Diabetes Maternal Grandmother   . Heart failure Maternal Grandmother   . Heart failure Paternal Grandfather   . Breast cancer Neg Hx     Social History   Socioeconomic History  . Marital status: Married    Spouse name: Not on file  .  Number of children: Not on file  . Years of education: Not on file  . Highest education level: Not on file  Occupational History  . Not on file  Social Needs  . Financial resource strain: Not on file  . Food insecurity    Worry: Not on file    Inability: Not on file  . Transportation needs    Medical: Not on file    Non-medical: Not on file  Tobacco Use  . Smoking status: Never Smoker  . Smokeless tobacco: Never Used  Substance and Sexual Activity  . Alcohol use: No  . Drug use: No  . Sexual activity: Yes  Lifestyle  . Physical activity    Days per week: Not on file    Minutes per session: Not on file  .  Stress: Not on file  Relationships  . Social Herbalist on phone: Not on file    Gets together: Not on file    Attends religious service: Not on file    Active member of club or organization: Not on file    Attends meetings of clubs or organizations: Not on file    Relationship status: Not on file  . Intimate partner violence    Fear of current or ex partner: Not on file    Emotionally abused: Not on file    Physically abused: Not on file    Forced sexual activity: Not on file  Other Topics Concern  . Not on file  Social History Narrative  . Not on file     Current Outpatient Medications:  .  amLODipine-benazepril (LOTREL) 5-10 MG capsule, TAKE 1 CAPSULE BY MOUTH EVERY DAY, Disp: 90 capsule, Rfl: 1 .  polyethylene glycol powder (GLYCOLAX/MIRALAX) powder, 255 grams one bottle for colonoscopy prep, Disp: 255 g, Rfl: 0 .  potassium chloride (K-DUR) 10 MEQ tablet, TAKE 1 TABLET (10 MEQ TOTAL) BY MOUTH ONCE A WEEK., Disp: 4 tablet, Rfl: 0 .  triamterene-hydrochlorothiazide (MAXZIDE-25) 37.5-25 MG tablet, TAKE 1/2 TABLET BY MOUTH EVERY DAY, Disp: 45 tablet, Rfl: 1 .  diphenhydrAMINE (BENADRYL) 25 MG tablet, Take 50 mg by mouth daily. , Disp: , Rfl:  .  polyethylene glycol powder (GLYCOLAX/MIRALAX) 17 GM/SCOOP powder, 255 grams one bottle for colonoscopy prep (Patient not taking: Reported on 12/09/2018), Disp: 255 g, Rfl: 0  Allergies  Allergen Reactions  . Macrobid [Nitrofurantoin Macrocrystal]   . Sulfa Antibiotics Hives    Other reaction(s): Hives  . Tape Other (See Comments)    blisters    I personally reviewed active problem list, medication list, allergies, notes from last encounter, lab results with the patient/caregiver today.   ROS Constitutional: Negative for fever or weight change.  Respiratory: Negative for cough and shortness of breath.   Cardiovascular: Negative for chest pain or palpitations.  Gastrointestinal: Negative for abdominal pain, no bowel  changes.  Musculoskeletal: Negative for gait problem or joint swelling.  Skin: Negative for rash.  Neurological: Negative for dizziness or headache.  No other specific complaints in a complete review of systems (except as listed in HPI above).  Objective  Vitals:   12/09/18 1308  BP: 126/84  Pulse: 100  Resp: 16  Temp: (!) 97.5 F (36.4 C)  TempSrc: Oral  SpO2: 99%  Weight: 191 lb 3.2 oz (86.7 kg)  Height: 5\' 7"  (1.702 m)   Body mass index is 29.95 kg/m.  Physical Exam Constitutional: Patient appears well-developed and well-nourished. No distress.  HENT: Head: Normocephalic and atraumatic.  Ears: bilateral TMs with no erythema or effusion; Nose: Nose normal. Mouth/Throat: Oropharynx is clear and moist. No oropharyngeal exudate or tonsillar swelling.  Eyes: Conjunctivae and EOM are normal. No scleral icterus.  Pupils are equal, round, and reactive to light.  Neck: Normal range of motion. Neck supple. No JVD present. No thyromegaly present.  Cardiovascular: Normal rate, regular rhythm and normal heart sounds.  No murmur heard. No BLE edema. Pulmonary/Chest: Effort normal and breath sounds normal. No respiratory distress. Musculoskeletal: Normal range of motion, no joint effusions. No gross deformities Neurological: Pt is alert and oriented to person, place, and time. No cranial nerve deficit. Coordination, balance, strength, speech and gait are normal.  Skin: Skin is warm and dry. No rash noted. No erythema.  Psychiatric: Patient has a normal mood and affect. behavior is normal. Judgment and thought content normal.  No results found for this or any previous visit (from the past 72 hour(s)).  PHQ2/9: Depression screen [facility] 2/9 12/09/2018 11/18/2017 09/24/2016 09/10/2016 06/07/2015  Decreased Interest 1 0 0 0 0  Down, Depressed, Hopeless 1 0 0 0 0  PHQ - 2 Score 2 0 0 0 0  Altered sleeping 1 - - - -  Tired, decreased energy 1 - - - -  Change in appetite 0 - - - -  Feeling bad or  failure about yourself  0 - - - -  Trouble concentrating 0 - - - -  Moving slowly or fidgety/restless 0 - - - -  PHQ-9 Score 4 - - - -   PHQ-2/9 Result is positive - see above.    Fall Risk: Fall Risk  12/09/2018 08/14/2018 11/18/2017 09/24/2016 09/10/2016  Falls in the past year? 0 0 No No No  Number falls in past yr: 0 - - - -  Injury with Fall? 0 - - - -  Follow up Falls evaluation completed - - - -   Assessment & Plan  1. Personal history of malignant neoplasm of breast - Needs to schedule follow up with Dr. Rogue Bussing   2. Prediabetes - COMPLETE METABOLIC PANEL WITH GFR - Hemoglobin A1c  3. Benign essential HTN - COMPLETE METABOLIC PANEL WITH GFR - amLODipine-benazepril (LOTREL) 5-10 MG capsule; TAKE 1 CAPSULE BY MOUTH EVERY DAY  Dispense: 90 capsule; Refill: 1 - triamterene-hydrochlorothiazide (MAXZIDE-25) 37.5-25 MG tablet; Take 0.5 tablets by mouth daily.  Dispense: 45 tablet; Refill: 1  4. Mixed hyperlipidemia - Lipid panel  5. 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 Rogue Bussing   6. Hypokalemia - potassium chloride (K-DUR) 10 MEQ tablet; Take 1 tablet (10 mEq total) by mouth once a week.  Dispense: 12 tablet; Refill: 1

## 2018-12-10 LAB — LIPID PANEL
Cholesterol: 224 mg/dL — ABNORMAL HIGH (ref <200)
HDL: 48 mg/dL — ABNORMAL LOW (ref >=50)
LDL Cholesterol (Calc): 140 mg/dL (calc) — ABNORMAL HIGH
Non-HDL Cholesterol (Calc): 176 mg/dL (calc) — ABNORMAL HIGH (ref <130)
Total CHOL/HDL Ratio: 4.7 (calc) (ref <5.0)
Triglycerides: 217 mg/dL — ABNORMAL HIGH (ref <150)

## 2018-12-10 LAB — COMPLETE METABOLIC PANEL WITH GFR
AG Ratio: 1.3 (calc) (ref 1.0–2.5)
ALT: 22 U/L (ref 6–29)
AST: 19 U/L (ref 10–35)
Albumin: 4.2 g/dL (ref 3.6–5.1)
Alkaline phosphatase (APISO): 78 U/L (ref 37–153)
BUN: 11 mg/dL (ref 7–25)
CO2: 26 mmol/L (ref 20–32)
Calcium: 10.1 mg/dL (ref 8.6–10.4)
Chloride: 103 mmol/L (ref 98–110)
Creat: 0.82 mg/dL (ref 0.50–0.99)
GFR, Est African American: 90 mL/min/{1.73_m2} (ref >=60)
GFR, Est Non African American: 77 mL/min/{1.73_m2} (ref >=60)
Globulin: 3.3 g/dL (calc) (ref 1.9–3.7)
Glucose, Bld: 107 mg/dL — ABNORMAL HIGH (ref 65–99)
Potassium: 3.8 mmol/L (ref 3.5–5.3)
Sodium: 141 mmol/L (ref 135–146)
Total Bilirubin: 0.4 mg/dL (ref 0.2–1.2)
Total Protein: 7.5 g/dL (ref 6.1–8.1)

## 2018-12-10 LAB — HEMOGLOBIN A1C
Hgb A1c MFr Bld: 5.9 % of total Hgb — ABNORMAL HIGH (ref <5.7)
Mean Plasma Glucose: 123 (calc)
eAG (mmol/L): 6.8 (calc)

## 2018-12-30 ENCOUNTER — Telehealth: Payer: Self-pay | Admitting: *Deleted

## 2018-12-30 NOTE — Telephone Encounter (Signed)
Patient contacted today and notified that Dr. Bary Castilla is no longer with the practice.   The patient's colonoscopy that was scheduled for 02-04-19 with Dr. Bary Castilla will be cancelled as well as COVID testing.   Patient was offered a referral to [facility] or another practice of her choosing but patient declines.   The patient states she will contact her PCP for further recommendations.

## 2019-01-08 ENCOUNTER — Encounter: Payer: Self-pay | Admitting: General Surgery

## 2019-01-29 ENCOUNTER — Other Ambulatory Visit: Payer: BLUE CROSS/BLUE SHIELD

## 2019-02-04 ENCOUNTER — Ambulatory Visit: Admit: 2019-02-04 | Payer: BLUE CROSS/BLUE SHIELD | Admitting: General Surgery

## 2019-02-04 SURGERY — COLONOSCOPY WITH PROPOFOL
Anesthesia: General

## 2019-03-28 IMAGING — MG MM DIGITAL SCREENING UNILAT*L* W/ CAD
2 series · 2 of 2 positions shown · non-contrast
Comparison: Previous exam(s).

CLINICAL DATA: Screening.

EXAM:
DIGITAL SCREENING UNILATERAL LEFT MAMMOGRAM WITH CAD

[L MLO]
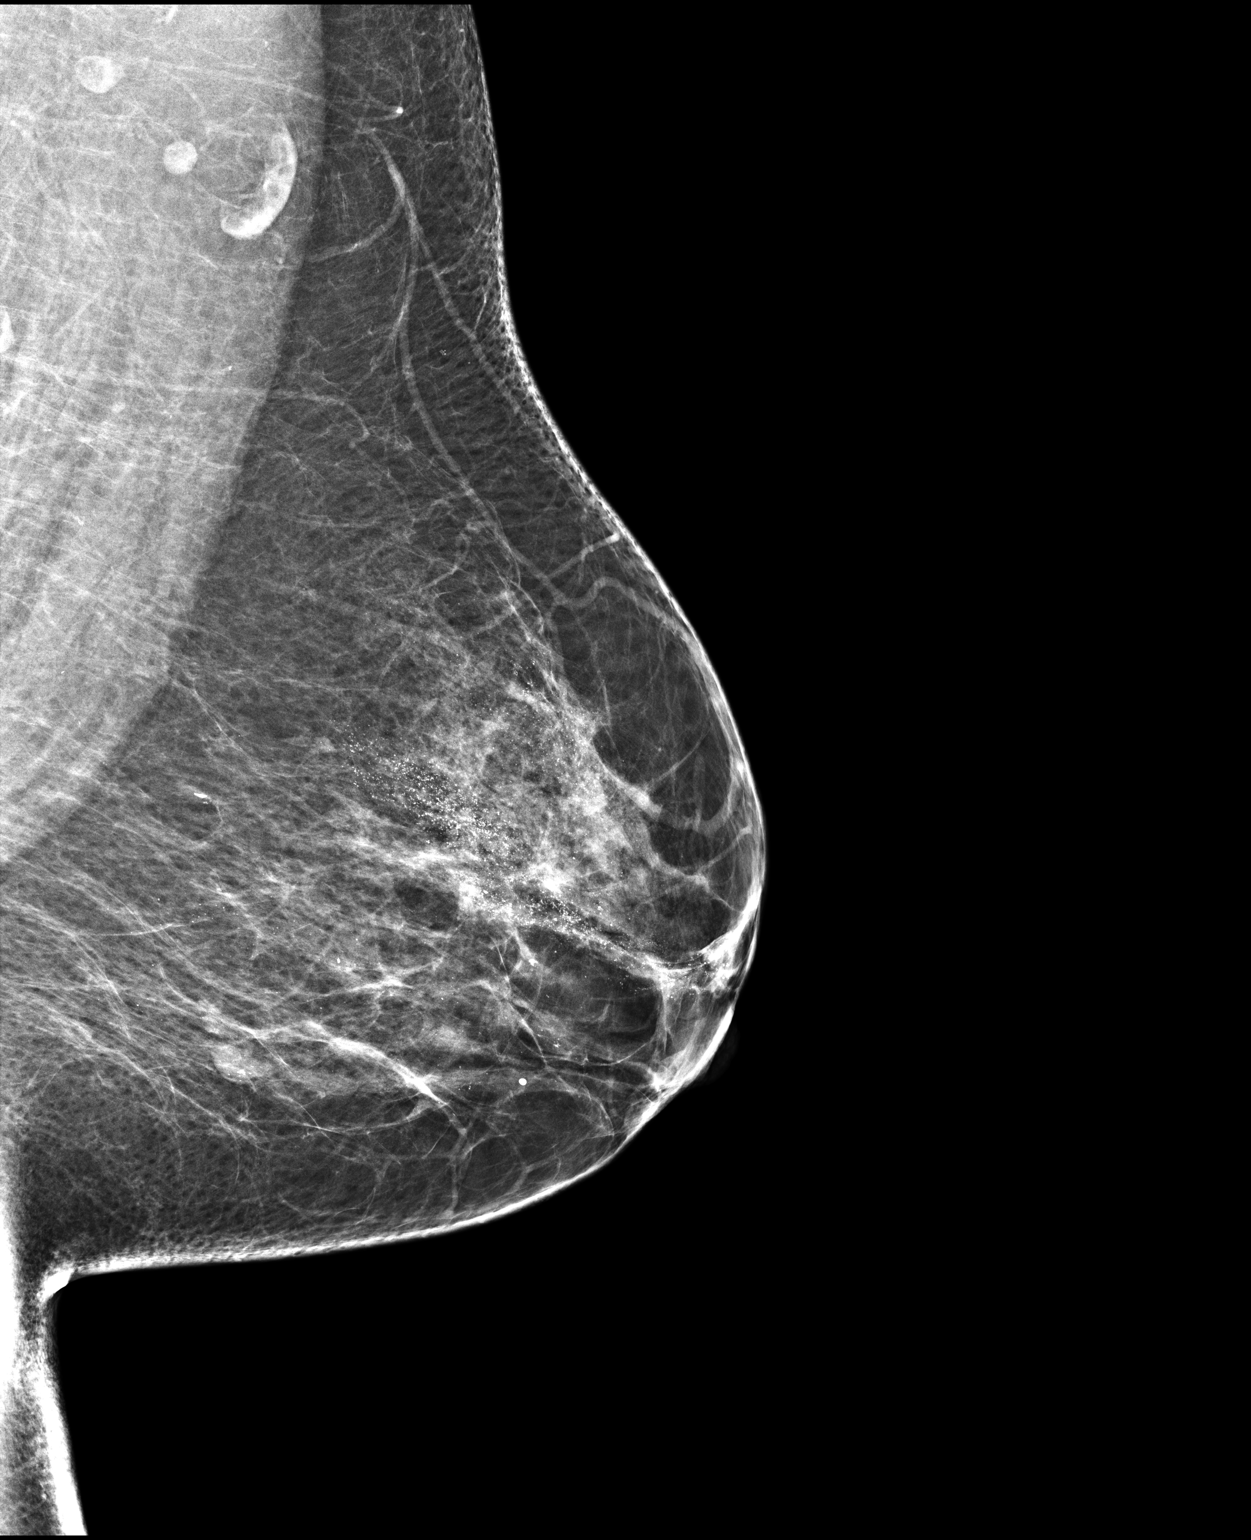

[L CC]
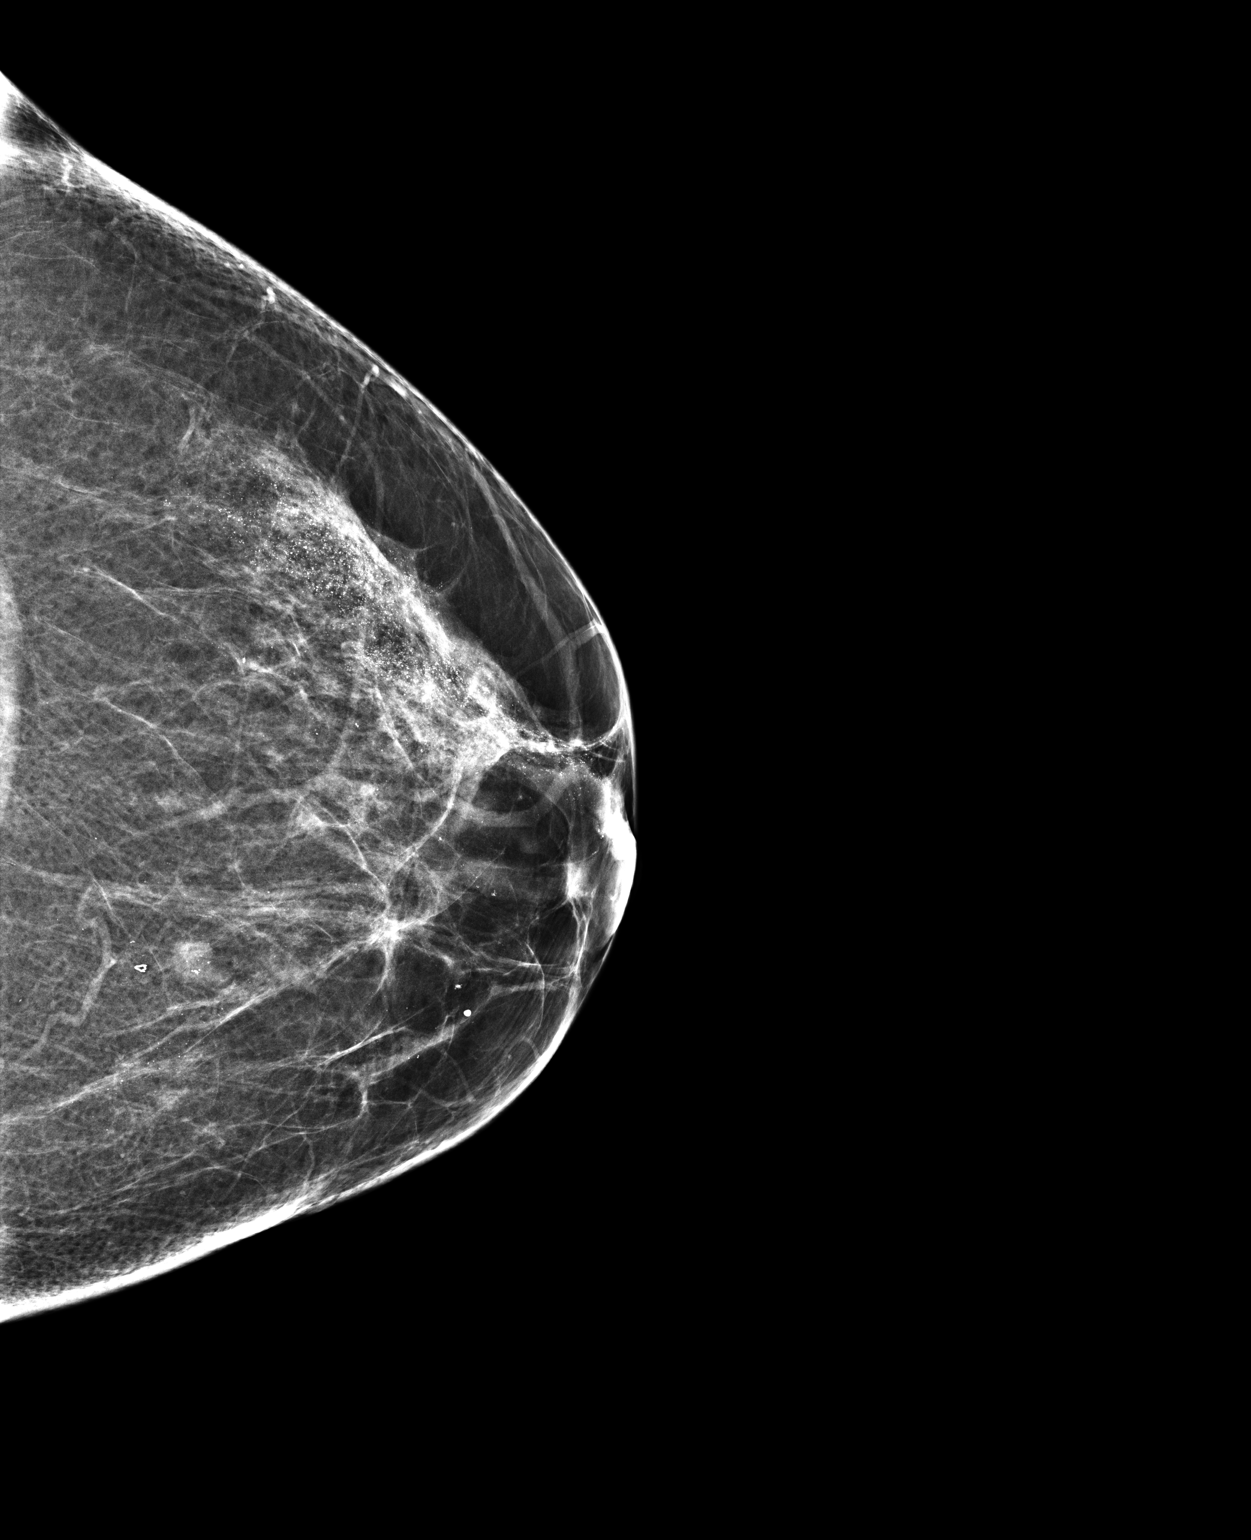

[2 of 2 positions shown; findings below may reference images not displayed]

ACR Breast Density Category b: There are scattered areas of
fibroglandular density.
FINDINGS: In the left breast, calcifications warrant further evaluation. In
the right breast, no findings suspicious for malignancy. Images were
processed with CAD.
IMPRESSION: Further evaluation is suggested for calcifications in the left
breast.

RECOMMENDATION:
Diagnostic mammogram of the left breast. (Code:[id number])

The patient will be contacted regarding the findings, and additional
imaging will be scheduled.

BI-RADS CATEGORY  0: Incomplete. Need additional imaging evaluation
and/or prior mammograms for comparison.

## 2019-05-28 ENCOUNTER — Other Ambulatory Visit: Payer: Self-pay | Admitting: Family Medicine

## 2019-05-28 DIAGNOSIS — E876 Hypokalemia: Secondary | ICD-10-CM

## 2019-06-11 ENCOUNTER — Ambulatory Visit: Payer: BC Managed Care – PPO | Admitting: Family Medicine

## 2019-06-19 ENCOUNTER — Other Ambulatory Visit: Payer: Self-pay | Admitting: General Surgery

## 2019-06-19 DIAGNOSIS — Z1231 Encounter for screening mammogram for malignant neoplasm of breast: Secondary | ICD-10-CM

## 2019-06-29 ENCOUNTER — Other Ambulatory Visit: Payer: Self-pay

## 2019-06-29 ENCOUNTER — Encounter: Payer: Self-pay | Admitting: Family Medicine

## 2019-06-29 ENCOUNTER — Ambulatory Visit (INDEPENDENT_AMBULATORY_CARE_PROVIDER_SITE_OTHER): Payer: BC Managed Care – PPO | Admitting: Family Medicine

## 2019-06-29 DIAGNOSIS — R7303 Prediabetes: Secondary | ICD-10-CM

## 2019-06-29 DIAGNOSIS — M818 Other osteoporosis without current pathological fracture: Secondary | ICD-10-CM

## 2019-06-29 DIAGNOSIS — E782 Mixed hyperlipidemia: Secondary | ICD-10-CM | POA: Diagnosis not present

## 2019-06-29 DIAGNOSIS — Z853 Personal history of malignant neoplasm of breast: Secondary | ICD-10-CM | POA: Diagnosis not present

## 2019-06-29 DIAGNOSIS — I10 Essential (primary) hypertension: Secondary | ICD-10-CM | POA: Diagnosis not present

## 2019-06-29 DIAGNOSIS — T386X5A Adverse effect of antigonadotrophins, antiestrogens, antiandrogens, not elsewhere classified, initial encounter: Secondary | ICD-10-CM

## 2019-06-29 MED ORDER — TRIAMTERENE-HCTZ 37.5-25 MG PO TABS: 0.5000 | ORAL_TABLET | Freq: Every day | ORAL | 1 refills | Status: DC

## 2019-06-29 MED ORDER — AMLODIPINE BESY-BENAZEPRIL HCL 5-10 MG PO CAPS: ORAL_CAPSULE | ORAL | 1 refills | Status: DC

## 2019-06-29 NOTE — Progress Notes (Signed)
Name: Shirley Blackwell   MRN: [id number]    DOB: February 06, 1956   Date:06/29/2019       Progress Note  Subjective  Chief Complaint  Chief Complaint  Patient presents with  . Hypertension  . Prediabetes  . Hyperlipidemia  . Obesity    I connected with  [name] on 06/29/19 at  1:40 PM EST by telephone and verified that I am speaking with the correct person using two identifiers.  I discussed the limitations, risks, security and privacy concerns of performing an evaluation and management service by telephone and the availability of in person appointments. Staff also discussed with the patient that there may be a patient responsible charge related to this service. Patient Location: Home Provider Location: Office Additional Individuals present: home  HPI  HTN:  -does take medications as prescribed - current regimen includes Lotrel and Maxzide-25 (1/2 tablet).  - taking medications as instructed, no medication side effects noted, no TIAs, no chest pain on exertion, no dyspnea on exertion, no swelling of ankles, no palpitations. - DASH diet discussed - pt does follow a low sodium diet.  - Checking at home periodically - once every few months and is always in normal range.  Taking potassium 2-3 times a month also drinking orange juice once daily to help with leg cramping.  Last Potassium level normal.   Prediabetes/Obesity: Endorses some polydipsia on occasion; no polyuria or polyphagia. We will check labs today. Wants to work on losing weight and try to do diet control at this time.  Weighed 182lbs today - she is working on getting back on track after the holidays.  Breast CA: Diagnosed in 2012, had lumpectomy, then total mastectomy (RIGHT), did a few rounds of Chemo, then did 5 years of oral medication (aromatase inhibitor).  She returns annually for monitoring - had follow up with Dr. Bary Castilla 06/24/2019. She is very compliant with mammograms.   CRC screening: She is going to Dr. Bary Castilla  for this as well - planning for May for screening.   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  Last DEXA was 10/17/2016 and showed osteopenia only with T-score -1.5.  We will recheck in about 6 months as she is dealing with her father's illness right now.   Caregiver Strain: Her father was diagnosed with stage IV prostate cancer with bone metastasis (Dad is now in hospice), mother is suffering from dementia.  Both parents are living in an RV on the patient's property. She feels that her caregiver strain has improved since our last visit - her parents are more joyful, not calling on her constantly throughout the day.  HLD: Not on statin - not indicated based on ASCVD risk calculator.  No chest pain or shortness of breath.  Discussed diet in detail - has started to get back to healthier, lower fat diet after the holidays.   The 10-year ASCVD risk score Mikey Bussing DC Brooke Bonito., et al., 2013) is: 5.6%   Values used to calculate the score:     Age: 64 years     Sex: Female     Is Non-Hispanic African American: No     Diabetic: No     Tobacco smoker: No     Systolic Blood Pressure: 123XX123 mmHg     Is BP treated: Yes     HDL Cholesterol: 48 mg/dL     Total Cholesterol: 224 mg/dL   Patient  Active Problem List   Diagnosis Date Noted  . 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 Overweight (BMI 25.0-29.9) 11/18/2017  . Medication monitoring encounter 09/22/2017  . Carcinoma of upper-outer quadrant of right breast in female, estrogen receptor positive ([place]) 12/23/2015  . Prediabetes 12/06/2014  . 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 HLD (hyperlipidemia) 12/06/2014  . 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 Stress at home 12/06/2014  . History of colonic polyps 10/02/2012  . Personal history of malignant neoplasm of breast   . Benign essential HTN 11/09/2006    Past Surgical History:  Procedure Laterality Date  . BREAST BIOPSY  Left 2000, 2002   core bx- neg  . 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  0/10 lymph nodes.  . COLONOSCOPY  2013, 2014   Dr. Jamal Collin  . KNEE SURGERY Left 2014  . MASTECTOMY Right 2012   invasive lobular CA,    Family History  Problem Relation Age of Onset  . Cancer Sister        Theadora Rama  . Diabetes Mother   . Hypertension Mother   . Kidney disease Mother   . Hypertension Father   . Prostate cancer Father   . Diabetes Maternal Grandmother   . Heart failure Maternal Grandmother   . Heart failure Paternal Grandfather   . Breast cancer Neg Hx     Social History   Socioeconomic History  . Marital status: Married    Spouse name: Not on file  . Number of children: Not on file  . Years of education: Not on file  . Highest education level: Not on file  Occupational History  . Not on file  Tobacco Use  . Smoking status: Never Smoker  . Smokeless tobacco: Never Used  Substance and Sexual Activity  . Alcohol use: No  . Drug use: No  . Sexual activity: Yes  Other Topics Concern  . Not on file  Social History Narrative  . Not on file   Social Determinants of Health   Financial Resource Strain:   . Difficulty of Paying Living Expenses: Not on file  Food Insecurity:   . Worried About Charity fundraiser in the Last Year: Not on file  . Ran Out of Food in the Last Year: Not on file  Transportation Needs:   . Lack of Transportation (Medical): Not on file  . Lack of Transportation (Non-Medical): Not on file  Physical Activity:   . Days of Exercise per Week: Not on file  . Minutes of Exercise per Session: Not on file  Stress:   . Feeling of Stress : Not on file  Social Connections:   . Frequency of Communication with Friends and Family: Not on file  . Frequency of Social Gatherings with Friends and Family: Not on file  . Attends Religious Services: Not on file  . Active Member of Clubs or  Organizations: Not on file  . Attends Archivist Meetings: Not on file  . Marital Status: Not on file  Intimate Partner Violence:   . Fear of Current or Ex-Partner: Not on file  . Emotionally Abused: Not on file  . Physically Abused: Not on file  . Sexually Abused: Not on file     Current Outpatient Medications:  .  amLODipine-benazepril (LOTREL) 5-10 MG capsule, TAKE 1 CAPSULE BY MOUTH EVERY DAY, Disp: 90 capsule, Rfl: 1 .  diphenhydrAMINE (BENADRYL) 25 MG tablet, Take 50 mg by mouth daily. ,  Disp: , Rfl:  .  potassium chloride (KLOR-CON) 10 MEQ tablet, TAKE 1 TABLET (10 MEQ TOTAL) BY MOUTH ONCE A WEEK., Disp: 12 tablet, Rfl: 1 .  triamterene-hydrochlorothiazide (MAXZIDE-25) 37.5-25 MG tablet, Take 0.5 tablets by mouth daily., Disp: 45 tablet, Rfl: 1  Allergies  Allergen Reactions  . Macrobid [Nitrofurantoin Macrocrystal]   . Sulfa Antibiotics Hives    Other reaction(s): Hives  . Tape Other (See Comments)    blisters    I personally reviewed active problem list, medication list, allergies, health maintenance, notes from last encounter, lab results with the patient/caregiver today.   ROS  Ten systems reviewed and is negative except as mentioned in HPI  Objective  Virtual encounter, vitals not obtained.  There is no height or weight on file to calculate BMI.  Physical Exam  Pulmonary/Chest: Effort normal. No respiratory distress. Speaking in complete sentences Neurological: Pt is alert and oriented to person, place, and time. Speech is normal. Psychiatric: Patient has a normal mood and affect. behavior is normal. Judgment and thought content normal.  No results found for this or any previous visit (from the past 72 hour(s)).  PHQ2/9: Depression screen [facility] 2/9 06/29/2019 12/09/2018 11/18/2017 09/24/2016 09/10/2016  Decreased Interest 0 1 0 0 0  Down, Depressed, Hopeless 0 1 0 0 0  PHQ - 2 Score 0 2 0 0 0  Altered sleeping 0 1 - - -  Tired, decreased energy 0 1 -  - -  Change in appetite 0 0 - - -  Feeling bad or failure about yourself  0 0 - - -  Trouble concentrating 0 0 - - -  Moving slowly or fidgety/restless 0 0 - - -  Suicidal thoughts 0 - - - -  PHQ-9 Score 0 4 - - -   PHQ-2/9 Result is negative.    Fall Risk: Fall Risk  12/09/2018 08/14/2018 11/18/2017 09/24/2016 09/10/2016  Falls in the past year? 0 0 No No No  Number falls in past yr: 0 - - - -  Injury with Fall? 0 - - - -  Follow up Falls evaluation completed - - - -    Assessment & Plan  1. Benign essential HTN - COMPLETE METABOLIC PANEL WITH GFR - amLODipine-benazepril (LOTREL) 5-10 MG capsule; TAKE 1 CAPSULE BY MOUTH EVERY DAY  Dispense: 90 capsule; Refill: 1 - triamterene-hydrochlorothiazide (MAXZIDE-25) 37.5-25 MG tablet; Take 0.5 tablets by mouth daily.  Dispense: 45 tablet; Refill: 1  2. Prediabetes - Discussed diet in detail. - COMPLETE METABOLIC PANEL WITH GFR - Hemoglobin A1c  3. Personal history of malignant neoplasm of breast - Seeing Dr. Bary Castilla for annual follow up  4. Mixed hyperlipidemia - Not on medication; discussed diet in detail. Recheck at 6 month follow up  5. 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 - DG Bone Density; Future   I discussed the assessment and treatment plan with the patient. The patient was provided an opportunity to ask questions and all were answered. The patient agreed with the plan and demonstrated an understanding of the instructions.   The patient was advised to call back or seek an in-person evaluation if the symptoms worsen or if the condition fails to improve as anticipated.  I provided 19 minutes of non-face-to-face time during this encounter.  Hubbard Hartshorn, FNP

## 2019-09-02 ENCOUNTER — Other Ambulatory Visit: Payer: Self-pay | Admitting: Family Medicine

## 2019-09-02 DIAGNOSIS — I10 Essential (primary) hypertension: Secondary | ICD-10-CM

## 2019-09-02 NOTE — Telephone Encounter (Signed)
Hypertension medication request:  Last office visit pertaining to hypertension:06/29/19  BP Readings from Last 3 Encounters:  12/09/18 126/84  08/14/18 115/80  12/06/17 115/77    Lab Results  Component Value Date   CREATININE 0.82 12/09/2018   BUN 11 12/09/2018   NA 141 12/09/2018   K 3.8 12/09/2018   CL 103 12/09/2018   CO2 26 12/09/2018     No follow-ups on file.

## 2019-09-17 ENCOUNTER — Telehealth: Payer: Self-pay | Admitting: Family Medicine

## 2019-09-17 DIAGNOSIS — I10 Essential (primary) hypertension: Secondary | ICD-10-CM

## 2019-09-17 NOTE — Telephone Encounter (Signed)
Pt called asking why her prescriptions have not been sen to the pharmacy.  Her insurance [employer].  Pt's CB#  [phone]

## 2019-09-17 NOTE — Telephone Encounter (Signed)
Requested medication (s) are due for refill today:  Yes  Requested medication (s) are on the active medication list:   Yes  Future visit scheduled:   Yes in 3 months   Last ordered: Both medications 1/182021.  Clinic note:   Returned because pharmacy needs a DX CODE for both of these medications.    Thanks.   Requested Prescriptions  Pending Prescriptions Disp Refills   triamterene-hydrochlorothiazide (MAXZIDE-25) 37.5-25 MG tablet [Pharmacy Med Name: TRIAMTERENE-HCTZ 37.5-25 MG TB] 45 tablet 1    Sig: TAKE 1/2 TABLET BY MOUTH EVERY DAY      Cardiovascular: Diuretic Combos Failed - 09/17/2019  8:36 AM      Failed - Valid encounter within last 6 months    Recent Outpatient Visits           2 months ago Benign essential HTN   [place], [place], FNP   9 months ago Personal history of malignant neoplasm of breast   [place], Astrid Divine, [place]   1 year ago Preventative health care   [place], Satira Anis, MD   2 years ago Mixed hyperlipidemia   [place], Satira Anis, MD   3 years ago Preventative health care   [place], Satira Anis, MD       Future Appointments             In 3 months Hubbard Hartshorn, [facility], [place] in normal range and within 360 days    Potassium  Date Value Ref Range Status  12/09/2018 3.8 3.5 - 5.3 mmol/L Final  09/07/2014 3.4 (L) mmol/L Final    Comment:    3.5-5.1 NOTE: New Reference Range  08/17/14           Passed - Na in normal range and within 360 days    Sodium  Date Value Ref Range Status  12/09/2018 141 135 - 146 mmol/L Final  12/14/2014 141 134 - 144 mmol/L Final  09/07/2014 143 mmol/L Final    Comment:    135-145 NOTE: New Reference Range  08/17/14           Passed - Cr in normal range and within 360 days    Creat  Date Value Ref Range Status   12/09/2018 0.82 0.50 - 0.99 mg/dL Final    Comment:    For patients >34 years of age, the reference limit for Creatinine is approximately 13% higher for people identified as African-American. .           Passed - Ca in normal range and within 360 days    Calcium  Date Value Ref Range Status  12/09/2018 10.1 8.6 - 10.4 mg/dL Final   Calcium, Total  Date Value Ref Range Status  09/07/2014 9.4 mg/dL Final    Comment:    8.9-10.3 NOTE: New Reference Range  08/17/14           Passed - Last BP in normal range    BP Readings from Last 1 Encounters:  12/09/18 126/84            amLODipine-benazepril (LOTREL) 5-10 MG capsule [Pharmacy Med Name: AMLODIPINE-BENAZEPRIL 5-10 MG] 90 capsule 1    Sig: TAKE 1 CAPSULE BY MOUTH EVERY DAY      Cardiovascular: CCB + ACEI Combos Failed - 09/17/2019  8:36 AM      Failed - Cr in normal range and within 180 days    Creat  Date Value Ref Range Status  12/09/2018 0.82 0.50 - 0.99 mg/dL Final    Comment:    For patients >88 years of age, the reference limit for Creatinine is approximately 13% higher for people identified as African-American. .           Failed - K in normal range and within 180 days    Potassium  Date Value Ref Range Status  12/09/2018 3.8 3.5 - 5.3 mmol/L Final  09/07/2014 3.4 (L) mmol/L Final    Comment:    3.5-5.1 NOTE: New Reference Range  08/17/14           Failed - Valid encounter within last 6 months    Recent Outpatient Visits           2 months ago Benign essential HTN   [place], FNP   9 months ago Personal history of malignant neoplasm of breast   [place], [place], FNP   1 year ago Preventative health care   [place], Satira Anis, MD   2 years ago Mixed hyperlipidemia   [place], Satira Anis, MD   3 years ago Preventative health care   [place],  Satira Anis, MD       Future Appointments             In 3 months Hubbard Hartshorn, [facility], [place] - Patient is not pregnant      Passed - Last BP in normal range    BP Readings from Last 1 Encounters:  12/09/18 126/84

## 2019-09-17 NOTE — Telephone Encounter (Signed)
Requested medication (s) are due for refill today: Yes  Requested medication (s) are on the active medication list: Yes  Last refill:  n/a  Future visit scheduled: Yes  Notes to clinic:  Diagnosis code needed for both medications.    Requested Prescriptions  Pending Prescriptions Disp Refills   triamterene-hydrochlorothiazide (MAXZIDE-25) 37.5-25 MG tablet [Pharmacy Med Name: TRIAMTERENE-HCTZ 37.5-25 MG TB] 45 tablet 1    Sig: TAKE 1/2 TABLET BY MOUTH EVERY DAY      Cardiovascular: Diuretic Combos Failed - 09/17/2019  8:27 AM      Failed - Valid encounter within last 6 months    Recent Outpatient Visits           2 months ago Benign essential HTN   [place], [place], FNP   9 months ago Personal history of malignant neoplasm of breast   [place], Astrid Divine, [place]   1 year ago Preventative health care   [place], Satira Anis, MD   2 years ago Mixed hyperlipidemia   [place], Satira Anis, MD   3 years ago Preventative health care   [place], Satira Anis, MD       Future Appointments             In 3 months Hubbard Hartshorn, [facility], [place] in normal range and within 360 days    Potassium  Date Value Ref Range Status  12/09/2018 3.8 3.5 - 5.3 mmol/L Final  09/07/2014 3.4 (L) mmol/L Final    Comment:    3.5-5.1 NOTE: New Reference Range  08/17/14           Passed - Na in normal range and within 360 days    Sodium  Date Value Ref Range Status  12/09/2018 141 135 - 146 mmol/L Final  12/14/2014 141 134 - 144 mmol/L Final  09/07/2014 143 mmol/L Final    Comment:    135-145 NOTE: New Reference Range  08/17/14           Passed - Cr in normal range and within 360 days    Creat  Date Value Ref Range Status  12/09/2018 0.82 0.50 - 0.99 mg/dL Final    Comment:    For patients >77  years of age, the reference limit for Creatinine is approximately 13% higher for people identified as African-American. .           Passed - Ca in normal range and within 360 days    Calcium  Date Value Ref Range Status  12/09/2018 10.1 8.6 - 10.4 mg/dL Final   Calcium, Total  Date Value Ref Range Status  09/07/2014 9.4 mg/dL Final    Comment:    8.9-10.3 NOTE: New Reference Range  08/17/14           Passed - Last BP in normal range    BP Readings from Last 1 Encounters:  12/09/18 126/84            amLODipine-benazepril (LOTREL) 5-10 MG capsule [Pharmacy Med Name: AMLODIPINE-BENAZEPRIL 5-10 MG] 90 capsule 1    Sig: TAKE 1 CAPSULE BY MOUTH EVERY DAY      Cardiovascular: CCB + ACEI Combos Failed - 09/17/2019  8:27 AM      Failed - Cr in normal range and within 180 days  Creat  Date Value Ref Range Status  12/09/2018 0.82 0.50 - 0.99 mg/dL Final    Comment:    For patients >19 years of age, the reference limit for Creatinine is approximately 13% higher for people identified as African-American. .           Failed - K in normal range and within 180 days    Potassium  Date Value Ref Range Status  12/09/2018 3.8 3.5 - 5.3 mmol/L Final  09/07/2014 3.4 (L) mmol/L Final    Comment:    3.5-5.1 NOTE: New Reference Range  08/17/14           Failed - Valid encounter within last 6 months    Recent Outpatient Visits           2 months ago Benign essential HTN   [place], FNP   9 months ago Personal history of malignant neoplasm of breast   [place], [place], FNP   1 year ago Preventative health care   [place], Satira Anis, MD   2 years ago Mixed hyperlipidemia   [place], Satira Anis, MD   3 years ago Preventative health care   [place], Satira Anis, MD       Future Appointments             In 3 months  Hubbard Hartshorn, [facility], [place] - Patient is not pregnant      Passed - Last BP in normal range    BP Readings from Last 1 Encounters:  12/09/18 126/84

## 2019-09-17 NOTE — Telephone Encounter (Signed)
I called pharmacy, patient has refills. They are waiting on her to bring in new insurance information

## 2019-09-17 NOTE — Telephone Encounter (Signed)
Patient is calling to ask why her request for medication refill has been declined.  Patient stated that she only has a few pills left and would like to speak with the nurse regarding the refills.  Please advise and call patient to update at [phone]

## 2019-09-17 NOTE — Telephone Encounter (Signed)
Called pt and read her the message that they are needing her new insurance card but pt said she just lft the pharm and they did not tell any of that

## 2019-09-17 NOTE — Telephone Encounter (Signed)
Pt called for refill on 09/02/19 and they were denied. She has two days worth of medication left and would like for these to be filled today. Would like a follow up on why they were denied. triamterene-hydrochlorothiazide (MAXZIDE-25) 37.5-25 MG tablet /amLODipine-benazepril (LOTREL) 5-10 MG capsule

## 2019-09-18 NOTE — Telephone Encounter (Signed)
Patient had refills on file when I called [facility]. Script sent to wrong pharmacy. I called and cancelled scripts at [facility] and gave her the last 90 day supply on valid script to [facility]

## 2019-11-06 ENCOUNTER — Ambulatory Visit
Admission: RE | Admit: 2019-11-06 | Discharge: 2019-11-06 | Disposition: A | Discharge status: Home / Self Care | Payer: BC Managed Care – PPO | Source: Ambulatory Visit | Attending: General Surgery | Admitting: General Surgery

## 2019-11-06 DIAGNOSIS — Z1231 Encounter for screening mammogram for malignant neoplasm of breast: Secondary | ICD-10-CM | POA: Insufficient documentation

## 2019-12-02 ENCOUNTER — Other Ambulatory Visit: Payer: Self-pay

## 2019-12-02 DIAGNOSIS — Z853 Personal history of malignant neoplasm of breast: Secondary | ICD-10-CM | POA: Diagnosis not present

## 2019-12-02 DIAGNOSIS — I10 Essential (primary) hypertension: Secondary | ICD-10-CM | POA: Diagnosis not present

## 2019-12-02 DIAGNOSIS — Z881 Allergy status to other antibiotic agents status: Secondary | ICD-10-CM | POA: Diagnosis not present

## 2019-12-02 DIAGNOSIS — Z79899 Other long term (current) drug therapy: Secondary | ICD-10-CM | POA: Diagnosis not present

## 2019-12-02 DIAGNOSIS — Z882 Allergy status to sulfonamides status: Secondary | ICD-10-CM | POA: Diagnosis not present

## 2019-12-02 DIAGNOSIS — K8000 Calculus of gallbladder with acute cholecystitis without obstruction: Secondary | ICD-10-CM | POA: Insufficient documentation

## 2019-12-02 DIAGNOSIS — R1010 Upper abdominal pain, unspecified: Secondary | ICD-10-CM | POA: Diagnosis present

## 2019-12-02 LAB — URINALYSIS, COMPLETE (UACMP) WITH MICROSCOPIC
Bacteria, UA: NONE SEEN
Bilirubin Urine: NEGATIVE
Glucose, UA: NEGATIVE mg/dL
Ketones, ur: NEGATIVE mg/dL
Leukocytes,Ua: NEGATIVE
Nitrite: NEGATIVE
Protein, ur: 100 mg/dL — AB
Specific Gravity, Urine: 1.023 (ref 1.005–1.030)
pH: 5 (ref 5.0–8.0)

## 2019-12-02 LAB — CBC
HCT: 44.9 % (ref 36.0–46.0)
Hemoglobin: 14.8 g/dL (ref 12.0–15.0)
MCH: 28.2 pg (ref 26.0–34.0)
MCHC: 33 g/dL (ref 30.0–36.0)
MCV: 85.5 fL (ref 80.0–100.0)
Platelets: 249 10*3/uL (ref 150–400)
RBC: 5.25 MIL/uL — ABNORMAL HIGH (ref 3.87–5.11)
RDW: 12.7 % (ref 11.5–15.5)
WBC: 9.9 10*3/uL (ref 4.0–10.5)
nRBC: 0 % (ref 0.0–0.2)

## 2019-12-02 LAB — COMPREHENSIVE METABOLIC PANEL
ALT: 22 U/L (ref 0–44)
AST: 19 U/L (ref 15–41)
Albumin: 4.4 g/dL (ref 3.5–5.0)
Alkaline Phosphatase: 82 U/L (ref 38–126)
Anion gap: 13 (ref 5–15)
BUN: 13 mg/dL (ref 8–23)
CO2: 27 mmol/L (ref 22–32)
Calcium: 10.2 mg/dL (ref 8.9–10.3)
Chloride: 99 mmol/L (ref 98–111)
Creatinine, Ser: 0.84 mg/dL (ref 0.44–1.00)
GFR calc Af Amer: >60 mL/min (ref >60)
GFR calc non Af Amer: >60 mL/min (ref >60)
Glucose, Bld: 117 mg/dL — ABNORMAL HIGH (ref 70–99)
Potassium: 3.3 mmol/L — ABNORMAL LOW (ref 3.5–5.1)
Sodium: 139 mmol/L (ref 135–145)
Total Bilirubin: 0.8 mg/dL (ref 0.3–1.2)
Total Protein: 8.6 g/dL — ABNORMAL HIGH (ref 6.5–8.1)

## 2019-12-02 LAB — LIPASE, BLOOD: Lipase: 32 U/L (ref 11–51)

## 2019-12-02 LAB — TROPONIN I (HIGH SENSITIVITY): Troponin I (High Sensitivity): 5 ng/L (ref <18)

## 2019-12-02 MED ORDER — SODIUM CHLORIDE 0.9% FLUSH: 3.0000 mL | Freq: Once | INTRAVENOUS | Status: DC

## 2019-12-02 NOTE — ED Triage Notes (Signed)
Pt comes via POV from home with c/o epigastric pain that started this am. Pt states no relief with medication.  Pt denies any N/V  Pt states 3/10 pain but earlier 5/6 out of 10

## 2019-12-03 ENCOUNTER — Emergency Department
Admission: EM | Admit: 2019-12-03 | Discharge: 2019-12-03 | Disposition: A | Discharge status: Home / Self Care | Payer: BC Managed Care – PPO | Attending: Emergency Medicine | Admitting: Emergency Medicine

## 2019-12-03 ENCOUNTER — Other Ambulatory Visit: Payer: Self-pay | Admitting: General Surgery

## 2019-12-03 ENCOUNTER — Emergency Department: Payer: BC Managed Care – PPO

## 2019-12-03 DIAGNOSIS — K8000 Calculus of gallbladder with acute cholecystitis without obstruction: Secondary | ICD-10-CM

## 2019-12-03 DIAGNOSIS — R1011 Right upper quadrant pain: Secondary | ICD-10-CM

## 2019-12-03 NOTE — Consult Note (Signed)
Reason for Consult:Gallstones  Referring Physician: Marjean Donna, MD  Shirley Blackwell is an 64 y.o. female.  HPI: Well until 4 AM yesterday when she awoke with epigastric pain.  Nauseated.  12 hour later took 3 ibuprofen with marked improvement. Contacted telehealth who recommended ED visit. Patient reported being cold, now resolved. Ultrasound showed cholelithiasis with gallbladder wall thickening and small area of peri-cholecystic fluid.  No prior episodes.  Asymptomatic at present.   Past Medical History:  Diagnosis Date  . Breast cancer [facility]) 2012   Right breast- chemo  . 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 Cancer of right breast ([place])    right mastectomy invasive lobular CA,   . Colon cancer ([place])    Pre cancerous polyps  . Hepatitis   . Hx of lumpectomy 2012   right  . Hypertension 1999  . Personal history of malignant neoplasm of breast 2012   R Mastectomy,  . Prediabetes 12/06/2014  . Shingles     Past Surgical History:  Procedure Laterality Date  . BREAST BIOPSY Left 2000, 2002   core bx- neg  . 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  0/10 lymph nodes.  . COLONOSCOPY  2013, 2014   Dr. Jamal Collin  . KNEE SURGERY Left 2014  . MASTECTOMY Right 2012   invasive lobular CA,    Family History  Problem Relation Age of Onset  . Cancer Sister        Theadora Rama  . Diabetes Mother   . Hypertension Mother   . Kidney disease Mother   . Hypertension Father   . Prostate cancer Father   . Diabetes Maternal Grandmother   . Heart failure Maternal Grandmother   . Heart failure Paternal Grandfather   . Breast cancer Neg Hx     Social History:  reports that she has never smoked. She has never used smokeless tobacco. She reports that she does not drink alcohol and does not use drugs.  Allergies:  Allergies  Allergen Reactions  . Macrobid  [Nitrofurantoin Macrocrystal]   . Sulfa Antibiotics Hives    Other reaction(s): Hives  . Tape Other (See Comments)    blisters    Medications: I have reviewed the patient's current medications.  Results for orders placed or performed during the hospital encounter of 12/03/19 (from the past 48 hour(s))  Lipase, blood     Status: None   Collection Time: 12/02/19  6:16 PM  Result Value Ref Range   Lipase 32 11 - 51 U/L    Comment: Performed at [facility], [address] [place], [place] [address]  Comprehensive metabolic panel     Status: Abnormal   Collection Time: 12/02/19  6:16 PM  Result Value Ref Range   Sodium 139 135 - 145 mmol/L   Potassium 3.3 (L) 3.5 - 5.1 mmol/L   Chloride 99 98 - 111 mmol/L   CO2 27 22 - 32 mmol/L   Glucose, Bld 117 (H) 70 - 99 mg/dL    Comment: Glucose reference range applies only to samples taken after fasting for at least 8 hours.   BUN 13 8 - 23 mg/dL   Creatinine, Ser 0.84 0.44 - 1.00 mg/dL   Calcium 10.2 8.9 - 10.3 mg/dL   Total Protein 8.6 (H) 6.5 - 8.1 g/dL   Albumin 4.4 3.5 - 5.0 g/dL   AST 19 15 -  41 U/L   ALT 22 0 - 44 U/L   Alkaline Phosphatase 82 38 - 126 U/L   Total Bilirubin 0.8 0.3 - 1.2 mg/dL   GFR calc non Af Amer >60 >60 mL/min   GFR calc Af Amer >60 >60 mL/min   Anion gap 13 5 - 15    Comment: Performed at [facility], [address] [place], [place] [address]  CBC     Status: Abnormal   Collection Time: 12/02/19  6:16 PM  Result Value Ref Range   WBC 9.9 4.0 - 10.5 K/uL   RBC 5.25 (H) 3.87 - 5.11 MIL/uL   Hemoglobin 14.8 12.0 - 15.0 g/dL   HCT 44.9 36 - 46 %   MCV 85.5 80.0 - 100.0 fL   MCH 28.2 26.0 - 34.0 pg   MCHC 33.0 30.0 - 36.0 g/dL   RDW 12.7 11.5 - 15.5 %   Platelets 249 150 - 400 K/uL   nRBC 0.0 0.0 - 0.2 %    Comment: Performed at [facility], [place], [place] [address]  Troponin I (High Sensitivity)     Status: None   Collection Time: 12/02/19  6:16 PM   Result Value Ref Range   Troponin I (High Sensitivity) 5 <18 ng/L    Comment: (NOTE) Elevated high sensitivity troponin I (hsTnI) values and significant  changes across serial measurements may suggest ACS but many other  chronic and acute conditions are known to elevate hsTnI results.  Refer to the "Links" section for chest pain algorithms and additional  guidance. Performed at [facility], [address] [place], [place] [address]   Urinalysis, Complete w Microscopic     Status: Abnormal   Collection Time: 12/02/19 10:44 PM  Result Value Ref Range   Color, Urine YELLOW (A) YELLOW   APPearance HAZY (A) CLEAR   Specific Gravity, Urine 1.023 1.005 - 1.030   pH 5.0 5.0 - 8.0   Glucose, UA NEGATIVE NEGATIVE mg/dL   Hgb urine dipstick MODERATE (A) NEGATIVE   Bilirubin Urine NEGATIVE NEGATIVE   Ketones, ur NEGATIVE NEGATIVE mg/dL   Protein, ur 100 (A) NEGATIVE mg/dL   Nitrite NEGATIVE NEGATIVE   Leukocytes,Ua NEGATIVE NEGATIVE   RBC / HPF 0-5 0 - 5 RBC/hpf   WBC, UA 0-5 0 - 5 WBC/hpf   Bacteria, UA NONE SEEN NONE SEEN   Squamous Epithelial / LPF 0-5 0 - 5   Mucus PRESENT     Comment: Performed at [facility], [address], [place], [place] [address]    US ABDOMEN LIMITED RUQ  Result Date: 12/03/2019 CLINICAL DATA:  Right upper quadrant pain for 1 day. EXAM: ULTRASOUND ABDOMEN LIMITED RIGHT UPPER QUADRANT COMPARISON:  None. FINDINGS: Gallbladder: Non mobile 1.7 cm stone in the gallbladder neck. The gallbladder is distended with mild wall thickening at 4 mm. Small amount of pericholecystic fluid. Positive sonographic Murphy sign noted by sonographer. Common bile duct: Diameter: 4 mm, normal. Liver: No focal lesion identified. Heterogeneously increased in parenchymal echogenicity. Portal vein is patent on color Doppler imaging with normal direction of blood flow towards the liver. Other: None. IMPRESSION: 1. Sonographic findings suspicious for acute cholecystitis.  Non mobile gallstone in the gallbladder neck with wall thickening, pericholecystic fluid, and positive sonographic Murphy sign. 2. No biliary dilatation. 3. Mild hepatic steatosis. Electronically Signed   By: Keith Rake M.D.   On: 12/03/2019 02:27    Review of Systems  Constitutional: Positive  for chills.  HENT: Negative.   Eyes: Negative.   Respiratory: Negative.   Cardiovascular: Negative.   Gastrointestinal: Positive for abdominal pain.  Endocrine: Negative.   Genitourinary: Negative.   Musculoskeletal: Negative.   Allergic/Immunologic: Negative.   Neurological: Negative.   Hematological: Negative.   Psychiatric/Behavioral: Negative.    Blood pressure (!) 130/94, pulse 74, temperature 98.3 F (36.8 C), temperature source Oral, resp. rate 20, height 5\' 7"  (1.702 m), weight 85.3 kg, SpO2 97 %. Physical Exam  Vitals reviewed. Constitutional: She appears well-developed.  Cardiovascular: Normal rate, regular rhythm and normal heart sounds.  Respiratory: Effort normal and breath sounds normal.  GI: Soft. Bowel sounds are normal. There is no abdominal tenderness. No hernia.  Neurological: She is alert.  Skin: Skin is warm and dry.    Assessment/Plan: Biliary colic, improved. Mild hypokalemia. Options for early vs elective cholecystectomy reviewed. Will scheduled early next week.  K+ replenishment in the interim.  Patient has my contact information.   Forest Gleason Daianna Vasques 12/03/2019, 3:27 AM

## 2019-12-03 NOTE — ED Provider Notes (Signed)
[facility] Emergency Department Provider Note _________   First MD Initiated Contact with Patient 12/03/19 0045     (approximate)  I have reviewed the triage vital signs and the nursing notes.   HISTORY  Chief Complaint Abdominal Pain    HPI Shirley Blackwell is a 64 y.o. female with below list of previous medical conditions presents to the emergency department secondary to cute onset of upper abdominal discomfort which patient states began at 4 AM yesterday morning.  Patient states that pain score was 6 out of 10 however currently she is 3 out of 10.  Patient denies any nausea or vomiting no diarrhea constipation.  Patient denies any fever.  Patient denies any urinary symptoms.      Past Medical History:  Diagnosis Date  . Breast cancer [facility]) 2012   Right breast- chemo  . 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 Cancer of right breast ([place])    right mastectomy invasive lobular CA,   . Colon cancer ([place])    Pre cancerous polyps  . Hepatitis   . Hx of lumpectomy 2012   right  . Hypertension 1999  . Personal history of malignant neoplasm of breast 2012   R Mastectomy,  . Prediabetes 12/06/2014  . Shingles     Patient Active Problem List   Diagnosis Date Noted  . 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 Overweight (BMI 25.0-29.9) 11/18/2017  . Carcinoma of upper-outer quadrant of right breast in female, estrogen receptor positive ([place]) 12/23/2015  . Prediabetes 12/06/2014  . 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 Mixed hyperlipidemia 12/06/2014  . 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 Stress at home 12/06/2014  . History of colonic polyps 10/02/2012  . Personal history of malignant neoplasm of breast   . Benign essential HTN 11/09/2006    Past Surgical History:  Procedure Laterality Date  . BREAST BIOPSY Left 2000, 2002   core bx- neg  . 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  0/10 lymph nodes.  . COLONOSCOPY  2013, 2014   Dr. Jamal Collin  . KNEE SURGERY Left 2014  . MASTECTOMY Right 2012   invasive lobular CA,    Prior to Admission medications   Medication Sig Start Date End Date Taking? Authorizing Provider  amLODipine-benazepril (LOTREL) 5-10 MG capsule TAKE 1 CAPSULE BY MOUTH EVERY DAY 06/29/19   Hubbard Hartshorn, FNP  diphenhydrAMINE (BENADRYL) 25 MG tablet Take 50 mg by mouth daily.     [provider]  potassium chloride (KLOR-CON) 10 MEQ tablet TAKE 1 TABLET (10 MEQ TOTAL) BY MOUTH ONCE A WEEK. 05/28/19   Hubbard Hartshorn, FNP  triamterene-hydrochlorothiazide (MAXZIDE-25) 37.5-25 MG tablet Take 0.5 tablets by mouth daily. 06/29/19   Hubbard Hartshorn, FNP    Allergies Macrobid [nitrofurantoin macrocrystal], Sulfa antibiotics, and Tape  Family History  Problem Relation Age of Onset  . Cancer Sister        Theadora Rama  . Diabetes Mother   . Hypertension Mother   . Kidney disease Mother   . Hypertension Father   . Prostate cancer Father   . Diabetes Maternal Grandmother   . Heart failure Maternal Grandmother   . Heart failure Paternal Grandfather   . Breast cancer Neg Hx     Social History Social History   Tobacco Use  . Smoking status: Never Smoker  . Smokeless tobacco: Never  Used  Vaping Use  . Vaping Use: Never used  Substance Use Topics  . Alcohol use: No  . Drug use: No    Review of Systems Constitutional: No fever/chills Eyes: No visual changes. ENT: No sore throat. Cardiovascular: Denies chest pain. Respiratory: Denies shortness of breath. Gastrointestinal: Positive for abdominal pain.  No nausea, no vomiting.  No diarrhea.  No constipation. Genitourinary: Negative for dysuria. Musculoskeletal: Negative for neck pain.  Negative for back pain. Integumentary: Negative for rash. Neurological: Negative for headaches, focal weakness or  numbness.   ____________________________________________   PHYSICAL EXAM:  VITAL SIGNS: ED Triage Vitals  Enc Vitals Group     BP 12/02/19 1814 (!) 141/87     Pulse Rate 12/02/19 1814 86     Resp 12/02/19 1814 20     Temp 12/02/19 1814 98.3 F (36.8 C)     Temp Source 12/02/19 1814 Oral     SpO2 12/02/19 1814 95 %     Weight 12/02/19 1815 85.3 kg (188 lb)     Height 12/02/19 1815 1.702 m (5\' 7" )     Head Circumference --      Peak Flow --      Pain Score 12/02/19 1819 3     Pain Loc --      Pain Edu? --      Excl. in [place]? --     Constitutional: Alert and oriented.  Eyes: Conjunctivae are normal.  Head: Atraumatic. Mouth/Throat: Patient is wearing a mask. Neck: No stridor.  No meningeal signs.   Cardiovascular: Normal rate, regular rhythm. Good peripheral circulation. Grossly normal heart sounds. Respiratory: Normal respiratory effort.  No retractions. Gastrointestinal: Soft and nontender. No distention.  Musculoskeletal: No lower extremity tenderness nor edema. No gross deformities of extremities. Neurologic:  Normal speech and language. No gross focal neurologic deficits are appreciated.  Skin:  Skin is warm, dry and intact. Psychiatric: Mood and affect are normal. Speech and behavior are normal.  ____________________________________________   LABS (all labs ordered are listed, but only abnormal results are displayed)  Labs Reviewed  COMPREHENSIVE METABOLIC PANEL - Abnormal; Notable for the following components:      Result Value   Potassium 3.3 (*)    Glucose, Bld 117 (*)    Total Protein 8.6 (*)    All other components within normal limits  CBC - Abnormal; Notable for the following components:   RBC 5.25 (*)    All other components within normal limits  URINALYSIS, COMPLETE (UACMP) WITH MICROSCOPIC - Abnormal; Notable for the following components:   Color, Urine YELLOW (*)    APPearance HAZY (*)    Hgb urine dipstick MODERATE (*)    Protein, ur 100 (*)     All other components within normal limits  LIPASE, BLOOD  TROPONIN I (HIGH SENSITIVITY)   ____________________________________________  EKG  ED ECG REPORT I, [place] N Onnie Hatchel, the attending physician, personally viewed and interpreted this ECG.   Date: 12/02/2019  EKG Time: 6:19 PM  Rate: 70  Rhythm: Normal sinus rhythm  Axis: Normal  Intervals: Normal  ST&T Change: None  ____________________________________________  RADIOLOGY I, [place] N Rusti Arizmendi, personally viewed and evaluated these images (plain radiographs) as part of my medical decision making, as well as reviewing the written report by the radiologist.  ED MD interpretation: Sonographic findings suspicious for acute cholecystitis nonmobile gallstone in gallbladder neck wall thickening pericholecystic fluid positive sonographic Murphy sign.  No biliary duct dilatation per radiologist on ultrasound interpretation  Official radiology report(s): US ABDOMEN LIMITED RUQ  Result Date: 12/03/2019 CLINICAL DATA:  Right upper quadrant pain for 1 day. EXAM: ULTRASOUND ABDOMEN LIMITED RIGHT UPPER QUADRANT COMPARISON:  None. FINDINGS: Gallbladder: Non mobile 1.7 cm stone in the gallbladder neck. The gallbladder is distended with mild wall thickening at 4 mm. Small amount of pericholecystic fluid. Positive sonographic Murphy sign noted by sonographer. Common bile duct: Diameter: 4 mm, normal. Liver: No focal lesion identified. Heterogeneously increased in parenchymal echogenicity. Portal vein is patent on color Doppler imaging with normal direction of blood flow towards the liver. Other: None. IMPRESSION: 1. Sonographic findings suspicious for acute cholecystitis. Non mobile gallstone in the gallbladder neck with wall thickening, pericholecystic fluid, and positive sonographic Murphy sign. 2. No biliary dilatation. 3. Mild hepatic steatosis. Electronically Signed   By: Keith Rake M.D.   On: 12/03/2019 02:27      Procedures   ____________________________________________   INITIAL IMPRESSION / MDM / [place] / ED COURSE  As part of my medical decision making, I reviewed the following data within the electronic MEDICAL RECORD NUMBER  64 year old female presented with above-stated history and physical exam a differential diagnosis including but not limited to cholelithiasis, cholecystitis, pancreatitis, less likely diverticulitis.  Laboratory data unremarkable ultrasound of the right upper quadrant performed revealed evidence of cholecystitis.  Patient is pain-free at present.  Patient evaluated by Dr. Bary Castilla general surgery who will see the patient outpatient.  ____________________________________________  FINAL CLINICAL IMPRESSION(S) / ED DIAGNOSES  Final diagnoses:  RUQ pain  Calculus of gallbladder with acute cholecystitis without obstruction     MEDICATIONS GIVEN DURING THIS VISIT:  Medications - No data to display   ED Discharge Orders    None      *Please note:  Shirley Blackwell was evaluated in Emergency Department on 12/03/2019 for the symptoms described in the history of present illness. She was evaluated in the context of the global COVID-19 pandemic, which necessitated consideration that the patient might be at risk for infection with the SARS-CoV-2 virus that causes COVID-19. Institutional protocols and algorithms that pertain to the evaluation of patients at risk for COVID-19 are in a state of rapid change based on information released by regulatory bodies including the CDC and federal and state organizations. These policies and algorithms were followed during the patient's care in the ED.  Some ED evaluations and interventions may be delayed as a result of limited staffing during and after the pandemic.*  Note:  This document was prepared using Dragon voice recognition software and may include unintentional dictation errors.   Gregor Hams, MD 12/03/19 [phone]

## 2019-12-03 NOTE — ED Notes (Signed)
US at the bedside

## 2019-12-03 NOTE — ED Notes (Signed)
Pt to the er for ruq pain and middle quad abd pain that woke her up 24 hours ago. Pt took IBU with little relief. Pt reports excessive burping over the day. Pt states pain is worse when sitting up. Pt does not require pain meds at this time.

## 2019-12-04 ENCOUNTER — Other Ambulatory Visit: Payer: Self-pay

## 2019-12-04 ENCOUNTER — Other Ambulatory Visit
Admission: RE | Admit: 2019-12-04 | Discharge: 2019-12-04 | Disposition: A | Discharge status: Home / Self Care | Payer: BC Managed Care – PPO | Source: Ambulatory Visit | Attending: General Surgery | Admitting: General Surgery

## 2019-12-04 DIAGNOSIS — Z01812 Encounter for preprocedural laboratory examination: Secondary | ICD-10-CM | POA: Diagnosis present

## 2019-12-04 DIAGNOSIS — Z20822 Contact with and (suspected) exposure to covid-19: Secondary | ICD-10-CM | POA: Insufficient documentation

## 2019-12-05 LAB — SARS CORONAVIRUS 2 (TAT 6-24 HRS): SARS Coronavirus 2: NEGATIVE

## 2019-12-08 ENCOUNTER — Encounter
Admission: RE | Disposition: A | Discharge status: Home / Self Care | Payer: Self-pay | Source: Home / Self Care | Attending: General Surgery

## 2019-12-08 ENCOUNTER — Ambulatory Visit
Admission: RE | Admit: 2019-12-08 | Discharge: 2019-12-08 | Disposition: A | Discharge status: Home / Self Care | Payer: BC Managed Care – PPO | Attending: General Surgery | Admitting: General Surgery

## 2019-12-08 ENCOUNTER — Other Ambulatory Visit: Payer: Self-pay

## 2019-12-08 ENCOUNTER — Ambulatory Visit: Payer: BC Managed Care – PPO

## 2019-12-08 ENCOUNTER — Encounter: Payer: Self-pay | Admitting: General Surgery

## 2019-12-08 DIAGNOSIS — K8010 Calculus of gallbladder with chronic cholecystitis without obstruction: Secondary | ICD-10-CM | POA: Diagnosis not present

## 2019-12-08 DIAGNOSIS — Z79899 Other long term (current) drug therapy: Secondary | ICD-10-CM | POA: Diagnosis not present

## 2019-12-08 DIAGNOSIS — Z9011 Acquired absence of right breast and nipple: Secondary | ICD-10-CM | POA: Diagnosis not present

## 2019-12-08 DIAGNOSIS — K759 Inflammatory liver disease, unspecified: Secondary | ICD-10-CM | POA: Insufficient documentation

## 2019-12-08 DIAGNOSIS — Z853 Personal history of malignant neoplasm of breast: Secondary | ICD-10-CM | POA: Diagnosis not present

## 2019-12-08 DIAGNOSIS — Z85038 Personal history of other malignant neoplasm of large intestine: Secondary | ICD-10-CM | POA: Insufficient documentation

## 2019-12-08 DIAGNOSIS — R7303 Prediabetes: Secondary | ICD-10-CM | POA: Insufficient documentation

## 2019-12-08 DIAGNOSIS — Z419 Encounter for procedure for purposes other than remedying health state, unspecified: Secondary | ICD-10-CM

## 2019-12-08 DIAGNOSIS — I10 Essential (primary) hypertension: Secondary | ICD-10-CM | POA: Diagnosis not present

## 2019-12-08 DIAGNOSIS — Z9049 Acquired absence of other specified parts of digestive tract: Secondary | ICD-10-CM | POA: Insufficient documentation

## 2019-12-08 HISTORY — PX: CHOLECYSTECTOMY: SHX55

## 2019-12-08 HISTORY — DX: Other specified postprocedural states: R11.2

## 2019-12-08 HISTORY — DX: Other specified postprocedural states: Z98.890

## 2019-12-08 HISTORY — DX: Other complications of anesthesia, initial encounter: T88.59XA

## 2019-12-08 SURGERY — LAPAROSCOPIC CHOLECYSTECTOMY WITH INTRAOPERATIVE CHOLANGIOGRAM
Anesthesia: General

## 2019-12-08 MED ORDER — ROCURONIUM BROMIDE 100 MG/10ML IV SOLN
INTRAVENOUS | Status: DC | PRN
  Administered 2019-12-08: 40 mg via INTRAVENOUS
  Administered 2019-12-08: 20 mg via INTRAVENOUS

## 2019-12-08 MED ORDER — FENTANYL CITRATE (PF) 100 MCG/2ML IJ SOLN
INTRAMUSCULAR | Status: AC
  Filled 2019-12-08: qty 2

## 2019-12-08 MED ORDER — CHLORHEXIDINE GLUCONATE 0.12 % MT SOLN: 15.0000 mL | Freq: Once | OROMUCOSAL | Status: AC

## 2019-12-08 MED ORDER — SODIUM CHLORIDE 0.9 % IV SOLN
INTRAVENOUS | Status: DC | PRN
  Administered 2019-12-08: 20 mL

## 2019-12-08 MED ORDER — ONDANSETRON HCL 4 MG/2ML IJ SOLN
INTRAMUSCULAR | Status: DC | PRN
  Administered 2019-12-08: 4 mg via INTRAVENOUS

## 2019-12-08 MED ORDER — SCOPOLAMINE 1 MG/3DAYS TD PT72
MEDICATED_PATCH | TRANSDERMAL | Status: AC
  Filled 2019-12-08: qty 1

## 2019-12-08 MED ORDER — SUCCINYLCHOLINE CHLORIDE 200 MG/10ML IV SOSY
PREFILLED_SYRINGE | INTRAVENOUS | Status: AC
  Filled 2019-12-08: qty 10

## 2019-12-08 MED ORDER — FENTANYL CITRATE (PF) 100 MCG/2ML IJ SOLN
INTRAMUSCULAR | Status: AC
  Administered 2019-12-08: 25 ug via INTRAVENOUS
  Filled 2019-12-08: qty 2

## 2019-12-08 MED ORDER — GLYCOPYRROLATE 0.2 MG/ML IJ SOLN
INTRAMUSCULAR | Status: DC | PRN
  Administered 2019-12-08: .1 mg via INTRAVENOUS

## 2019-12-08 MED ORDER — DEXAMETHASONE SODIUM PHOSPHATE 10 MG/ML IJ SOLN
INTRAMUSCULAR | Status: AC
  Filled 2019-12-08: qty 1

## 2019-12-08 MED ORDER — ONDANSETRON HCL 4 MG/2ML IJ SOLN
INTRAMUSCULAR | Status: AC
  Filled 2019-12-08: qty 2

## 2019-12-08 MED ORDER — MIDAZOLAM HCL 2 MG/2ML IJ SOLN
INTRAMUSCULAR | Status: AC
  Filled 2019-12-08: qty 2

## 2019-12-08 MED ORDER — LIDOCAINE HCL (CARDIAC) PF 100 MG/5ML IV SOSY
PREFILLED_SYRINGE | INTRAVENOUS | Status: DC | PRN
  Administered 2019-12-08: 80 mg via INTRAVENOUS

## 2019-12-08 MED ORDER — FAMOTIDINE 20 MG PO TABS
20.0000 mg | ORAL_TABLET | Freq: Once | ORAL | Status: AC
  Administered 2019-12-08: 20 mg via ORAL

## 2019-12-08 MED ORDER — CEFAZOLIN SODIUM-DEXTROSE 2-4 GM/100ML-% IV SOLN
2.0000 g | INTRAVENOUS | Status: AC
  Administered 2019-12-08: 2 g via INTRAVENOUS

## 2019-12-08 MED ORDER — LACTATED RINGERS IV SOLN: INTRAVENOUS | Status: DC

## 2019-12-08 MED ORDER — FAMOTIDINE 20 MG PO TABS
ORAL_TABLET | ORAL | Status: AC
  Filled 2019-12-08: qty 1

## 2019-12-08 MED ORDER — PROPOFOL 10 MG/ML IV BOLUS
INTRAVENOUS | Status: AC
  Filled 2019-12-08: qty 20

## 2019-12-08 MED ORDER — PROPOFOL 10 MG/ML IV BOLUS
INTRAVENOUS | Status: DC | PRN
  Administered 2019-12-08: 130 mg via INTRAVENOUS

## 2019-12-08 MED ORDER — PROPOFOL 10 MG/ML IV BOLUS
INTRAVENOUS | Status: DC | PRN
  Administered 2019-12-08: 20 ug/kg/min via INTRAVENOUS

## 2019-12-08 MED ORDER — ONDANSETRON HCL 4 MG/2ML IJ SOLN: 4.0000 mg | Freq: Once | INTRAMUSCULAR | Status: DC | PRN

## 2019-12-08 MED ORDER — HYDROCODONE-ACETAMINOPHEN 5-325 MG PO TABS
1.0000 | ORAL_TABLET | ORAL | 0 refills | Status: AC | PRN
Start: 2019-12-08 — End: 2020-12-07

## 2019-12-08 MED ORDER — SUGAMMADEX SODIUM 200 MG/2ML IV SOLN
INTRAVENOUS | Status: DC | PRN
  Administered 2019-12-08: 200 mg via INTRAVENOUS

## 2019-12-08 MED ORDER — ORAL CARE MOUTH RINSE: 15.0000 mL | Freq: Once | OROMUCOSAL | Status: AC

## 2019-12-08 MED ORDER — CHLORHEXIDINE GLUCONATE 0.12 % MT SOLN
OROMUCOSAL | Status: AC
  Administered 2019-12-08: 15 mL via OROMUCOSAL
  Filled 2019-12-08: qty 15

## 2019-12-08 MED ORDER — ACETAMINOPHEN 10 MG/ML IV SOLN
INTRAVENOUS | Status: DC | PRN
  Administered 2019-12-08: 1000 mg via INTRAVENOUS

## 2019-12-08 MED ORDER — LIDOCAINE HCL (PF) 2 % IJ SOLN
INTRAMUSCULAR | Status: AC
  Filled 2019-12-08: qty 5

## 2019-12-08 MED ORDER — SCOPOLAMINE 1 MG/3DAYS TD PT72
1.0000 | MEDICATED_PATCH | TRANSDERMAL | Status: DC
  Administered 2019-12-08: 1.5 mg via TRANSDERMAL

## 2019-12-08 MED ORDER — CEFAZOLIN SODIUM-DEXTROSE 2-4 GM/100ML-% IV SOLN
INTRAVENOUS | Status: AC
  Filled 2019-12-08: qty 100

## 2019-12-08 MED ORDER — DEXAMETHASONE SODIUM PHOSPHATE 10 MG/ML IJ SOLN
INTRAMUSCULAR | Status: DC | PRN
  Administered 2019-12-08: 10 mg via INTRAVENOUS

## 2019-12-08 MED ORDER — KETOROLAC TROMETHAMINE 30 MG/ML IJ SOLN
INTRAMUSCULAR | Status: DC | PRN
  Administered 2019-12-08: 30 mg via INTRAVENOUS

## 2019-12-08 MED ORDER — ACETAMINOPHEN 10 MG/ML IV SOLN
INTRAVENOUS | Status: AC
  Filled 2019-12-08: qty 100

## 2019-12-08 MED ORDER — FENTANYL CITRATE (PF) 100 MCG/2ML IJ SOLN
25.0000 ug | INTRAMUSCULAR | Status: DC | PRN
  Administered 2019-12-08: 25 ug via INTRAVENOUS

## 2019-12-08 MED ORDER — MIDAZOLAM HCL 2 MG/2ML IJ SOLN
INTRAMUSCULAR | Status: DC | PRN
  Administered 2019-12-08: 1 mg via INTRAVENOUS

## 2019-12-08 MED ORDER — PHENYLEPHRINE HCL (PRESSORS) 10 MG/ML IV SOLN
INTRAVENOUS | Status: DC | PRN
  Administered 2019-12-08: 100 ug via INTRAVENOUS

## 2019-12-08 MED ORDER — FENTANYL CITRATE (PF) 100 MCG/2ML IJ SOLN
INTRAMUSCULAR | Status: DC | PRN
  Administered 2019-12-08 (×3): 50 ug via INTRAVENOUS

## 2019-12-08 SURGICAL SUPPLY — 43 items
APPLIER CLIP ROT 10 11.4 M/L (STAPLE) ×3
BLADE SURG 11 STRL SS SAFETY (MISCELLANEOUS) ×3 IMPLANT
CANISTER SUCT 1200ML W/VALVE (MISCELLANEOUS) ×3 IMPLANT
CANNULA DILATOR  5MM W/SLV (CANNULA) ×2
CANNULA DILATOR 10 W/SLV (CANNULA) ×2 IMPLANT
CANNULA DILATOR 10MM W/SLV (CANNULA) ×1
CANNULA DILATOR 5 W/SLV (CANNULA) ×4 IMPLANT
CATH CHOLANG 76X19 KUMAR (CATHETERS) ×3 IMPLANT
CHLORAPREP W/TINT 26 (MISCELLANEOUS) ×3 IMPLANT
CLIP APPLIE ROT 10 11.4 M/L (STAPLE) ×1 IMPLANT
CLOSURE WOUND 1/2 X4 (GAUZE/BANDAGES/DRESSINGS) ×1
CONRAY 60ML FOR OR (MISCELLANEOUS) ×3 IMPLANT
COVER WAND RF STERILE (DRAPES) ×3 IMPLANT
DISSECTOR KITTNER STICK (MISCELLANEOUS) IMPLANT
DISSECTORS/KITTNER STICK (MISCELLANEOUS)
DRAPE C-ARM 42X70 (DRAPES) ×3 IMPLANT
DRSG TEGADERM 2-3/8X2-3/4 SM (GAUZE/BANDAGES/DRESSINGS) ×12 IMPLANT
DRSG TELFA 4X3 1S NADH ST (GAUZE/BANDAGES/DRESSINGS) ×3 IMPLANT
ELECT REM PT RETURN 9FT ADLT (ELECTROSURGICAL) ×3
ELECTRODE REM PT RTRN 9FT ADLT (ELECTROSURGICAL) ×1 IMPLANT
GLOVE BIO SURGEON STRL SZ7.5 (GLOVE) ×9 IMPLANT
GLOVE INDICATOR 8.0 STRL GRN (GLOVE) ×9 IMPLANT
GOWN STRL REUS W/ TWL LRG LVL3 (GOWN DISPOSABLE) ×3 IMPLANT
GOWN STRL REUS W/TWL LRG LVL3 (GOWN DISPOSABLE) ×6
IRRIGATION STRYKERFLOW (MISCELLANEOUS) ×1 IMPLANT
IRRIGATOR STRYKERFLOW (MISCELLANEOUS) ×3
IV LACTATED RINGERS 1000ML (IV SOLUTION) ×3 IMPLANT
KIT TURNOVER KIT A (KITS) ×3 IMPLANT
LABEL OR SOLS (LABEL) ×3 IMPLANT
NDL INSUFF ACCESS 14 VERSASTEP (NEEDLE) ×3 IMPLANT
NS IRRIG 500ML POUR BTL (IV SOLUTION) ×3 IMPLANT
PACK LAP CHOLECYSTECTOMY (MISCELLANEOUS) ×3 IMPLANT
POUCH SPECIMEN RETRIEVAL 10MM (ENDOMECHANICALS) ×3 IMPLANT
SCISSORS METZENBAUM CVD 33 (INSTRUMENTS) ×3 IMPLANT
SET TUBE SMOKE EVAC HIGH FLOW (TUBING) ×3 IMPLANT
STRIP CLOSURE SKIN 1/2X4 (GAUZE/BANDAGES/DRESSINGS) ×2 IMPLANT
SUT PDS PLUS 0 (SUTURE) ×2
SUT PDS PLUS AB 0 CT-2 (SUTURE) ×1 IMPLANT
SUT VIC AB 0 CT2 27 (SUTURE) IMPLANT
SUT VIC AB 4-0 FS2 27 (SUTURE) ×3 IMPLANT
SWABSTK COMLB BENZOIN TINCTURE (MISCELLANEOUS) ×3 IMPLANT
TROCAR XCEL NON-BLD 11X100MML (ENDOMECHANICALS) ×3 IMPLANT
WATER STERILE IRR 1000ML POUR (IV SOLUTION) IMPLANT

## 2019-12-08 NOTE — H&P (Signed)
Shirley Blackwell [id number] 1955/11/14     HPI:  64 y.o woman with an episode of biliary colic last week. No recurrence. For elective cholecystectomy.   Medications Prior to Admission  Medication Sig Dispense Refill Last Dose  . amLODipine-benazepril (LOTREL) 5-10 MG capsule TAKE 1 CAPSULE BY MOUTH EVERY DAY (Patient taking differently: Take 1 capsule by mouth daily. TAKE 1 CAPSULE BY MOUTH EVERY DAY) 90 capsule 1 12/07/2019 at Unknown time  . Calcium Carbonate-Vitamin D (CALTRATE 600+D PO) Take 1,200 mg by mouth daily.   12/07/2019 at Unknown time  . diphenhydrAMINE (BENADRYL) 25 MG tablet Take 50 mg by mouth daily.    12/07/2019 at Unknown time  . Misc Natural Products (GLUCOSAMINE CHOND COMPLEX/MSM PO) Take 2 tablets by mouth daily.   Past Week at Unknown time  . potassium chloride (KLOR-CON) 10 MEQ tablet TAKE 1 TABLET (10 MEQ TOTAL) BY MOUTH ONCE A WEEK. (Patient taking differently: Take 10 mEq by mouth every 14 (fourteen) days. ) 12 tablet 1 12/07/2019 at Unknown time  . triamterene-hydrochlorothiazide (MAXZIDE-25) 37.5-25 MG tablet Take 0.5 tablets by mouth daily. 45 tablet 1 12/07/2019 at Unknown time   Allergies  Allergen Reactions  . Macrobid [Nitrofurantoin Macrocrystal]   . Sulfa Antibiotics Hives    Other reaction(s): Hives  . Tape Other (See Comments)    blisters   Past Medical History:  Diagnosis Date  . Breast cancer [facility]) 2012   Right breast- chemo  . 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 Cancer of right breast ([place])    right mastectomy invasive lobular CA,   . Colon cancer ([place])    Pre cancerous polyps  . Complication of anesthesia   . Hepatitis   . Hx of lumpectomy 2012   right  . Hypertension 1999  . Personal history of malignant neoplasm of breast 2012   R Mastectomy,  . PONV (postoperative nausea and vomiting)   . Prediabetes 12/06/2014  . Shingles    Past Surgical History:  Procedure Laterality Date  .  BREAST BIOPSY Left 2000, 2002   core bx- neg  . 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  0/10 lymph nodes.  . COLONOSCOPY  2013, 2014   Dr. Jamal Collin  . KNEE SURGERY Left 2014  . MASTECTOMY Right 2012   invasive lobular CA,   Social History   Socioeconomic History  . Marital status: Married    Spouse name: Not on file  . Number of children: Not on file  . Years of education: Not on file  . Highest education level: Not on file  Occupational History  . Not on file  Tobacco Use  . Smoking status: Never Smoker  . Smokeless tobacco: Never Used  Vaping Use  . Vaping Use: Never used  Substance and Sexual Activity  . Alcohol use: No  . Drug use: No  . Sexual activity: Yes  Other Topics Concern  . Not on file  Social History Narrative  . Not on file   Social Determinants of Health   Financial Resource Strain:   . Difficulty of Paying Living Expenses:   Food Insecurity:   . Worried About Charity fundraiser in the Last Year:   . Arboriculturist in the Last Year:   Transportation Needs:   . Film/video editor (Medical):   Marland Kitchen Lack of Transportation (Non-Medical):   Physical  Activity:   . Days of Exercise per Week:   . Minutes of Exercise per Session:   Stress:   . Feeling of Stress :   Social Connections:   . Frequency of Communication with Friends and Family:   . Frequency of Social Gatherings with Friends and Family:   . Attends Religious Services:   . Active Member of Clubs or Organizations:   . Attends Archivist Meetings:   Marland Kitchen Marital Status:   Intimate Partner Violence:   . Fear of Current or Ex-Partner:   . Emotionally Abused:   Marland Kitchen Physically Abused:   . Sexually Abused:    Social History   Social History Narrative  . Not on file     ROS: Negative.     PE: HEENT: Negative. Lungs: Clear. Cardio: RR.   Assessment/Plan:  Proceed with planned cholecystectomy.     Forest Gleason [facility] 12/08/2019

## 2019-12-08 NOTE — Transfer of Care (Signed)
Immediate Anesthesia Transfer of Care Note  Patient: Shirley Blackwell  Procedure(s) Performed: LAPAROSCOPIC CHOLECYSTECTOMY WITH INTRAOPERATIVE CHOLANGIOGRAM (N/A )  Patient Location: PACU  Anesthesia Type:General  Level of Consciousness: awake and patient cooperative  Airway & Oxygen Therapy: Patient Spontanous Breathing and Patient connected to face mask oxygen  Post-op Assessment: Report given to RN and Post -op Vital signs reviewed and stable  Post vital signs: Reviewed and stable  Last Vitals:  Vitals Value Taken Time  BP 116/65 12/08/19 1223  Temp 36.4 C 12/08/19 1223  Pulse 80 12/08/19 1233  Resp 16 12/08/19 1233  SpO2 100 % 12/08/19 1233  Vitals shown include unvalidated device data.  Last Pain:  Vitals:   12/08/19 1223  TempSrc: Temporal  PainSc:          Complications: No complications documented.

## 2019-12-08 NOTE — Discharge Instructions (Signed)

## 2019-12-08 NOTE — Op Note (Signed)
Preoperative diagnosis: Chronic cholecystitis and cholelithiasis.  Postoperative diagnosis: Same.  Operative procedure: Laparoscopic cholecystectomy with intraoperative cholangiogram.  Operating surgeon: Hervey Ard, MD.  Anesthesia: General endotracheal.  Estimated blood loss: 5 cc.  Clinical note: This 64 year old woman had an episode of biliary colic last week.  She was admitted for elective cholecystectomy.  She had been asymptomatic since that episode.  SCD stockings for DVT prevention. Operative note: The patient underwent general endotracheal anesthesia and tolerated this well.  With the patient in Trendelenburg position a varies needle was placed with transumbilical incision.  After assuring intra-abdominal location from the hanging drop test the abdomen was insufflated with CO2 at 10 mmHg pressure.  A 10 mm Step port was expanded.  Inspection showed no evidence of injury from initial port placement.  There was a band of adhesions near the umbilicus and into the right upper quadrant from her previous laparoscopic colectomy.  The camera was negotiated into the epigastric area and an 11 mm XL port placed under direct vision.  2-5 mm Ports were then placed in the right lateral abdominal wall.  The adhesions in the mid abdomen were taken down with cautery dissection.  The gallbladder showed mild chronic changes and edema.  This was placed on cephalad traction.  The neck of the gallbladder was cleared.  The Kumar clamp was placed and fluoroscopic cholangiograms completed using 20 cc of one half strength Conray 60.  There had been some air bubbles in the line of these precede passing through the sphincter.  Reflux into both the right and left hepatic ducts.  No evidence of retained stones.  The cystic duct and cystic artery were doubly clipped and divided.  A posterior branch was encountered and this was clipped as well.  This accounted for all the bleeding.  The gallbladder is removed from the  liver bed making use of hook cautery dissection.  There was a small rent in the gallbladder and the bile leak was quickly controlled.  The gallbladder was placed into a Endo Catch bag and delivered to the umbilical port site without incident.  After reestablishing pneumoperitoneum the right upper quadrant was irrigated with lactated Ringer solution.  Good hemostasis was noted.  The abdomen was then desufflated and ports removed under direct vision.  The fascia at the umbilicus which had been opened slightly to allow extraction of the large stone was closed with interrupted 0 PDS sutures x2.  Skin incisions were closed with 4-0 Vicryl subcuticular sutures.  Benzoin, Steri-Strips, Telfa and Tegaderm dressings were applied.  Patient tolerated the procedure well and was taken to recovery room in stable condition.

## 2019-12-08 NOTE — Anesthesia Preprocedure Evaluation (Signed)
Anesthesia Evaluation  Patient identified by MRN, date of birth, ID band Patient awake    Reviewed: Allergy & Precautions, NPO status , Patient's Chart, lab work & pertinent test results  Airway Mallampati: II  TM Distance: <3 FB     Dental   Pulmonary neg pulmonary ROS,    Pulmonary exam normal        Cardiovascular hypertension, Normal cardiovascular exam     Neuro/Psych negative neurological ROS  negative psych ROS   GI/Hepatic (+) Hepatitis -Colon polyps   Endo/Other  diabetes  Renal/GU negative Renal ROS  negative genitourinary   Musculoskeletal negative musculoskeletal ROS (+)   Abdominal Normal abdominal exam  (+)   Peds negative pediatric ROS (+)  Hematology negative hematology ROS (+)   Anesthesia Other Findings Past Medical History: 2012: Breast cancer ([place])     Comment:  Right breast- chemo 2012: Cancer ([place])     Comment:  right mastectomy invasive lobular CA, ERPR positive Her               2 negative No date: Cancer of right breast ([place])     Comment:  right mastectomy invasive lobular CA,  No date: Colon cancer ([place])     Comment:  Pre cancerous polyps No date: Hepatitis 2012: Hx of lumpectomy     Comment:  right 1999: Hypertension 2012: Personal history of malignant neoplasm of breast     Comment:  R Mastectomy, 12/06/2014: Prediabetes No date: Shingles  Reproductive/Obstetrics                             Anesthesia Physical Anesthesia Plan  ASA: II  Anesthesia Plan: General   Post-op Pain Management:    Induction: Intravenous  PONV Risk Score and Plan:   Airway Management Planned: Oral ETT  Additional Equipment:   Intra-op Plan:   Post-operative Plan:   Informed Consent: I have reviewed the patients History and Physical, chart, labs and discussed the procedure including the risks, benefits and alternatives for the proposed anesthesia with the patient  or authorized representative who has indicated his/her understanding and acceptance.     Dental advisory given  Plan Discussed with: CRNA and Surgeon  Anesthesia Plan Comments:         Anesthesia Quick Evaluation

## 2019-12-08 NOTE — Anesthesia Procedure Notes (Signed)
Procedure Name: Intubation Date/Time: 12/08/2019 11:10 AM Performed by: Nolon Lennert, RN Pre-anesthesia Checklist: Patient identified, Patient being monitored, Timeout performed, Emergency Drugs available and Suction available Patient Re-evaluated:Patient Re-evaluated prior to induction Oxygen Delivery Method: Circle System Utilized Preoxygenation: Pre-oxygenation with 100% oxygen Induction Type: IV induction Ventilation: Mask ventilation without difficulty Laryngoscope Size: Mac and 3 Grade View: Grade I Tube type: Oral Tube size: 7.0 mm Number of attempts: 1 Airway Equipment and Method: Stylet Placement Confirmation: ETT inserted through vocal cords under direct vision,  positive ETCO2 and breath sounds checked- equal and bilateral Secured at: 20 cm Tube secured with: Tape Dental Injury: Teeth and Oropharynx as per pre-operative assessment  Difficulty Due To: Difficulty was unanticipated

## 2019-12-09 ENCOUNTER — Other Ambulatory Visit: Payer: Self-pay | Admitting: Family Medicine

## 2019-12-09 DIAGNOSIS — I10 Essential (primary) hypertension: Secondary | ICD-10-CM

## 2019-12-09 LAB — SURGICAL PATHOLOGY

## 2019-12-09 NOTE — Telephone Encounter (Signed)
Requested Prescriptions  Pending Prescriptions Disp Refills   triamterene-hydrochlorothiazide (MAXZIDE-25) 37.5-25 MG tablet [Pharmacy Med Name: TRIAMTERENE-HCTZ 37.5-25 MG TB] 45 tablet 0    Sig: TAKE 1/2 TABLET BY MOUTH ONCE DAILY     Cardiovascular: Diuretic Combos Failed - 12/09/2019  1:11 AM      Failed - K in normal range and within 360 days    Potassium  Date Value Ref Range Status  12/02/2019 3.3 (L) 3.5 - 5.1 mmol/L Final  09/07/2014 3.4 (L) mmol/L Final    Comment:    3.5-5.1 NOTE: New Reference Range  08/17/14          Failed - Valid encounter within last 6 months    Recent Outpatient Visits          5 months ago Benign essential HTN   [place], [place], FNP   1 year ago Personal history of malignant neoplasm of breast   [place], [place], FNP   2 years ago Preventative health care   [place], Satira Anis, MD   3 years ago Mixed hyperlipidemia   [place], Satira Anis, MD   3 years ago Preventative health care   [place], MD      Future Appointments            In 2 weeks Delsa Grana, PA-C [facility], [place] in normal range and within 360 days    Sodium  Date Value Ref Range Status  12/02/2019 139 135 - 145 mmol/L Final  12/14/2014 141 134 - 144 mmol/L Final  09/07/2014 143 mmol/L Final    Comment:    135-145 NOTE: New Reference Range  08/17/14          Passed - Cr in normal range and within 360 days    Creat  Date Value Ref Range Status  12/09/2018 0.82 0.50 - 0.99 mg/dL Final    Comment:    For patients >52 years of age, the reference limit for Creatinine is approximately 13% higher for people identified as African-American. .    Creatinine, Ser  Date Value Ref Range Status  12/02/2019 0.84 0.44 - 1.00 mg/dL Final         Passed - Ca in normal range  and within 360 days    Calcium  Date Value Ref Range Status  12/02/2019 10.2 8.9 - 10.3 mg/dL Final   Calcium, Total  Date Value Ref Range Status  09/07/2014 9.4 mg/dL Final    Comment:    8.9-10.3 NOTE: New Reference Range  08/17/14          Passed - Last BP in normal range    BP Readings from Last 1 Encounters:  12/08/19 98/65          amLODipine-benazepril (LOTREL) 5-10 MG capsule [Pharmacy Med Name: AMLODIPINE-BENAZEPRIL 5-10 MG] 90 capsule 1    Sig: TAKE 1 CAPSULE BY MOUTH EVERY DAY     Cardiovascular: CCB + ACEI Combos Failed - 12/09/2019  1:11 AM      Failed - K in normal range and within 180 days    Potassium  Date Value Ref Range Status  12/02/2019 3.3 (L) 3.5 - 5.1 mmol/L Final  09/07/2014 3.4 (L) mmol/L Final    Comment:    3.5-5.1 NOTE: New Reference Range  08/17/14  Failed - Valid encounter within last 6 months    Recent Outpatient Visits          5 months ago Benign essential HTN   [place], [place], [place]   1 year ago Personal history of malignant neoplasm of breast   [place], [place], [place]   2 years ago Preventative health care   [place], Satira Anis, MD   3 years ago Mixed hyperlipidemia   [place], Satira Anis, MD   3 years ago Preventative health care   [place], MD      Future Appointments            In 2 weeks Delsa Grana, PA-C [facility] [place], [place] in normal range and within 180 days    Creat  Date Value Ref Range Status  12/09/2018 0.82 0.50 - 0.99 mg/dL Final    Comment:    For patients >42 years of age, the reference limit for Creatinine is approximately 13% higher for people identified as African-American. .    Creatinine, Ser  Date Value Ref Range Status  12/02/2019 0.84 0.44 - 1.00 mg/dL Final         Passed - Patient is  not pregnant      Passed - Last BP in normal range    BP Readings from Last 1 Encounters:  12/08/19 98/65         BP on 12/08/2019 was post operative BP.

## 2019-12-11 NOTE — Anesthesia Postprocedure Evaluation (Signed)
Anesthesia Post Note  Patient: Shirley Blackwell  Procedure(s) Performed: LAPAROSCOPIC CHOLECYSTECTOMY WITH INTRAOPERATIVE CHOLANGIOGRAM (N/A )  Patient location during evaluation: PACU Anesthesia Type: General Level of consciousness: awake and alert and oriented Pain management: pain level controlled Vital Signs Assessment: post-procedure vital signs reviewed and stable Respiratory status: spontaneous breathing Cardiovascular status: blood pressure returned to baseline Anesthetic complications: no   No complications documented.   Last Vitals:  Vitals:   12/08/19 1353 12/08/19 1430  BP: 99/72 98/65  Pulse: (!) 57 76  Resp: 16   Temp: (!) 36.2 C   SpO2: 96% 100%    Last Pain:  Vitals:   12/09/19 1601  TempSrc:   PainSc: 3                  Isra Lindy

## 2019-12-28 ENCOUNTER — Ambulatory Visit: Payer: BC Managed Care – PPO | Admitting: Family Medicine

## 2019-12-29 ENCOUNTER — Other Ambulatory Visit: Payer: Self-pay

## 2019-12-29 ENCOUNTER — Ambulatory Visit (INDEPENDENT_AMBULATORY_CARE_PROVIDER_SITE_OTHER): Payer: BC Managed Care – PPO | Admitting: Family Medicine

## 2019-12-29 ENCOUNTER — Encounter: Payer: Self-pay | Admitting: Family Medicine

## 2019-12-29 VITALS — BP 84/64 | HR 99 | Temp 98.3°F | Resp 16 | Ht 67.0 in | Wt 183.2 lb

## 2019-12-29 DIAGNOSIS — Z5181 Encounter for therapeutic drug level monitoring: Secondary | ICD-10-CM

## 2019-12-29 DIAGNOSIS — R7303 Prediabetes: Secondary | ICD-10-CM | POA: Diagnosis not present

## 2019-12-29 DIAGNOSIS — I10 Essential (primary) hypertension: Secondary | ICD-10-CM

## 2019-12-29 DIAGNOSIS — E782 Mixed hyperlipidemia: Secondary | ICD-10-CM

## 2019-12-29 MED ORDER — AMLODIPINE BESY-BENAZEPRIL HCL 5-10 MG PO CAPS: 1.0000 | ORAL_CAPSULE | Freq: Every day | ORAL | 3 refills | Status: DC

## 2019-12-29 NOTE — Patient Instructions (Signed)
Please come in about 2-3 weeks for a nurse blood pressure check and we will get your labs done at that time.

## 2019-12-29 NOTE — Progress Notes (Signed)
No BP's right arm per patient.

## 2019-12-29 NOTE — Progress Notes (Signed)
Name: Shirley Blackwell   MRN: [id number]    DOB: 01-Nov-1955   Date:12/29/2019       Progress Note  Chief Complaint  Patient presents with  . Medication Refill  . Hypertension     Subjective:   Shirley Blackwell is a 64 y.o. female, presents to clinic for HTN f/up, BP low  Hypertension:  Currently managed on amlodipine-benazepril 5-10 and triamterene-HCTZ - BP ranges soft/low to low normal over years - rarely SBP >130 - unclear how she got to be on so many medications.  Triamterene has caused low potassium and daily potassium supplementation.  Pt feels fine, but BP lower than her normal, she feels more relaxed - all kids moved out of the house and she has been busy today and didn't eat or drink much, she also lost some weight recently Pt reports good med compliance and denies any SE.  No lightheadedness, hypotension, syncope. Blood pressure today is low - soft BP Readings from Last 3 Encounters:  12/29/19 (!) 84/64  12/08/19 98/65  12/03/19 (!) 140/92   Pt denies CP, SOB, exertional sx, LE edema, palpitation, Ha's, visual disturbances   Hyperlipidemia:   Family history of statin intolerance  She has not ever tried statin or other meds or supplements Last Lipids: Lab Results  Component Value Date   CHOL 224 (H) 12/09/2018   HDL 48 (L) 12/09/2018   LDLCALC 140 (H) 12/09/2018   TRIG 217 (H) 12/09/2018   CHOLHDL 4.7 12/09/2018   - Denies: Chest pain, shortness of breath, myalgias, claudication        Current Outpatient Medications:  .  amLODipine-benazepril (LOTREL) 5-10 MG capsule, TAKE 1 CAPSULE BY MOUTH EVERY DAY, Disp: 90 capsule, Rfl: 0 .  Calcium Carbonate-Vitamin D (CALTRATE 600+D PO), Take 1,200 mg by mouth daily., Disp: , Rfl:  .  diphenhydrAMINE (BENADRYL) 25 MG tablet, Take 50 mg by mouth daily. , Disp: , Rfl:  .  Misc Natural Products (GLUCOSAMINE CHOND COMPLEX/MSM PO), Take 2 tablets by mouth daily., Disp: , Rfl:  .  potassium chloride (KLOR-CON) 10 MEQ tablet,  TAKE 1 TABLET (10 MEQ TOTAL) BY MOUTH ONCE A WEEK., Disp: 12 tablet, Rfl: 1 .  triamterene-hydrochlorothiazide (MAXZIDE-25) 37.5-25 MG tablet, TAKE 1/2 TABLET BY MOUTH ONCE DAILY, Disp: 45 tablet, Rfl: 0 .  HYDROcodone-acetaminophen (NORCO/VICODIN) 5-325 MG tablet, Take 1 tablet by mouth every 4 (four) hours as needed for moderate pain., Disp: 20 tablet, Rfl: 0  Patient Active Problem List   Diagnosis Date Noted  . 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 Overweight (BMI 25.0-29.9) 11/18/2017  . Carcinoma of upper-outer quadrant of right breast in female, estrogen receptor positive ([place]) 12/23/2015  . Prediabetes 12/06/2014  . 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 Mixed hyperlipidemia 12/06/2014  . 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 Stress at home 12/06/2014  . History of colonic polyps 10/02/2012  . Personal history of malignant neoplasm of breast   . Benign essential HTN 11/09/2006    Past Surgical History:  Procedure Laterality Date  . BREAST BIOPSY Left 2000, 2002   core bx- neg  . CHOLECYSTECTOMY N/A 12/08/2019   Procedure: LAPAROSCOPIC CHOLECYSTECTOMY WITH INTRAOPERATIVE CHOLANGIOGRAM;  Surgeon: Robert Bellow, MD;  Location: [facility] ORS;  Service: General;  Laterality: N/A;  . 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  0/10 lymph nodes.  . COLONOSCOPY  2013, 2014   Dr. Jamal Collin  . KNEE SURGERY Left 2014  .  MASTECTOMY Right 2012   invasive lobular CA,    Family History  Problem Relation Age of Onset  . Cancer Sister        Theadora Rama  . Diabetes Mother   . Hypertension Mother   . Kidney disease Mother   . Hypertension Father   . Prostate cancer Father   . Diabetes Maternal Grandmother   . Heart failure Maternal Grandmother   . Heart failure Paternal Grandfather   . Breast cancer Neg Hx     Social History   Tobacco Use  . Smoking status: Never Smoker  . Smokeless tobacco: Never Used  Vaping  Use  . Vaping Use: Never used  Substance Use Topics  . Alcohol use: No  . Drug use: No     Allergies  Allergen Reactions  . Macrobid [Nitrofurantoin Macrocrystal]   . Sulfa Antibiotics Hives    Other reaction(s): Hives  . Tape Other (See Comments)    blisters    Health Maintenance  Topic Date Due  . Hepatitis C Screening  Never done  . HIV Screening  Never done  . PAP SMEAR-Modifier  09/11/2019  . COVID-19 Vaccine (1) 01/14/2020 (Originally 07/22/1967)  . COLONOSCOPY  12/28/2020 (Originally 11/12/2017)  . INFLUENZA VACCINE  01/10/2020  . MAMMOGRAM  11/05/2020  . TETANUS/TDAP  11/27/2021    Chart Review Today: I personally reviewed active problem list, medication list, allergies, family history, social history, health maintenance, notes from last encounter, lab results, imaging with the patient/caregiver today.   Review of Systems  10 Systems reviewed and are negative for acute change except as noted in the HPI.  Objective:   Vitals:   12/29/19 1318  BP: (!) 84/64  Pulse: 99  Resp: 16  Temp: 98.3 F (36.8 C)  TempSrc: Temporal  SpO2: 98%  Weight: 183 lb 3.2 oz (83.1 kg)  Height: 5\' 7"  (1.702 m)    Body mass index is 28.69 kg/m.  Physical Exam Vitals and nursing note reviewed.  Constitutional:      General: She is not in acute distress.    Appearance: Normal appearance. She is well-developed. She is not ill-appearing, toxic-appearing or diaphoretic.     Interventions: Face mask in place.  HENT:     Head: Normocephalic and atraumatic.     Right Ear: External ear normal.     Left Ear: External ear normal.  Eyes:     General: Lids are normal. No scleral icterus.       Right eye: No discharge.        Left eye: No discharge.     Conjunctiva/sclera: Conjunctivae normal.  Neck:     Trachea: Phonation normal. No tracheal deviation.  Cardiovascular:     Rate and Rhythm: Normal rate and regular rhythm.     Pulses: Normal pulses.          Radial pulses are 2+  on the right side and 2+ on the left side.       Posterior tibial pulses are 2+ on the right side and 2+ on the left side.     Heart sounds: Normal heart sounds. No murmur heard.  No friction rub. No gallop.   Pulmonary:     Effort: Pulmonary effort is normal. No respiratory distress.     Breath sounds: Normal breath sounds. No stridor. No wheezing, rhonchi or rales.  Chest:     Chest wall: No tenderness.  Abdominal:     General: Bowel sounds are normal. There is no  distension.     Palpations: Abdomen is soft.     Tenderness: There is no abdominal tenderness. There is no guarding or rebound.  Musculoskeletal:        General: No deformity. Normal range of motion.     Cervical back: Normal range of motion and neck supple.     Right lower leg: No edema.     Left lower leg: No edema.  Lymphadenopathy:     Cervical: No cervical adenopathy.  Skin:    General: Skin is warm and dry.     Capillary Refill: Capillary refill takes less than 2 seconds.     Coloration: Skin is not jaundiced or pale.     Findings: No rash.  Neurological:     Mental Status: She is alert and oriented to person, place, and time.     Motor: No abnormal muscle tone.     Gait: Gait normal.  Psychiatric:        Speech: Speech normal.        Behavior: Behavior normal.         Assessment & Plan:   1. Benign essential HTN BP soft - no need to have pt on 4 meds with hx of being well controlled and low normal to soft/hypotensive.   D/c triamterene-HCTZ and potassium supplement - monitor BP and return in 2-3 weeks for nurse visit for BP recheck  If BP too high in 2-3 weeks, would increase ACEI OK with BP 100/60-130/80  - amLODipine-benazepril (LOTREL) 5-10 MG capsule; Take 1 capsule by mouth daily.  Dispense: 90 capsule; Refill: 3 - COMPLETE METABOLIC PANEL WITH GFR  2. Mixed hyperlipidemia High cholesterol - pt refuses statin, encouraged to work on diet/exercise - may need to try meds other than statins?  If  very high may refer to specialist to see if she can get cardiac eval? Repatha? etc - COMPLETE METABOLIC PANEL WITH GFR - Lipid panel  3. Prediabetes Recheck A1C, some weight loss - COMPLETE METABOLIC PANEL WITH GFR - Hemoglobin A1c  4. Encounter for medication monitoring  - CBC with Differential/Platelet - COMPLETE METABOLIC PANEL WITH GFR - Hemoglobin A1c - Lipid panel   CMA asked to repeat BP after having pt walk around clinic, but it unfortunately did not get rechecked, fortunately the pt felt fine, denies lightheadedness or dizziness Return in about 6 months (around 06/30/2020) for Routine follow-up   (needs 2-3 week nurse BP recheck).   Delsa Grana, PA-C 12/29/19 1:38 PM

## 2020-01-19 ENCOUNTER — Other Ambulatory Visit: Payer: Self-pay

## 2020-01-19 ENCOUNTER — Ambulatory Visit: Payer: BC Managed Care – PPO

## 2020-01-19 VITALS — BP 102/64

## 2020-01-19 DIAGNOSIS — I10 Essential (primary) hypertension: Secondary | ICD-10-CM

## 2020-01-19 NOTE — Progress Notes (Signed)
Patient is here for a blood pressure check. Patient denies chest pain, palpitations, shortness of breath or visual disturbances. At previous visit blood pressure was 84/64Today during nurse visit blood pressure was 102/64. She does take blood pressure medications.

## 2020-01-20 LAB — COMPLETE METABOLIC PANEL WITH GFR
AG Ratio: 1.4 (calc) (ref 1.0–2.5)
ALT: 19 U/L (ref 6–29)
AST: 16 U/L (ref 10–35)
Albumin: 4.1 g/dL (ref 3.6–5.1)
Alkaline phosphatase (APISO): 76 U/L (ref 37–153)
BUN: 12 mg/dL (ref 7–25)
CO2: 29 mmol/L (ref 20–32)
Calcium: 9.2 mg/dL (ref 8.6–10.4)
Chloride: 104 mmol/L (ref 98–110)
Creat: 0.85 mg/dL (ref 0.50–0.99)
GFR, Est African American: 85 mL/min/{1.73_m2} (ref >=60)
GFR, Est Non African American: 73 mL/min/{1.73_m2} (ref >=60)
Globulin: 2.9 g/dL (calc) (ref 1.9–3.7)
Glucose, Bld: 102 mg/dL — ABNORMAL HIGH (ref 65–99)
Potassium: 3.6 mmol/L (ref 3.5–5.3)
Sodium: 142 mmol/L (ref 135–146)
Total Bilirubin: 0.6 mg/dL (ref 0.2–1.2)
Total Protein: 7 g/dL (ref 6.1–8.1)

## 2020-01-20 LAB — CBC WITH DIFFERENTIAL/PLATELET
Absolute Monocytes: 422 cells/uL (ref 200–950)
Basophils Absolute: 51 cells/uL (ref 0–200)
Basophils Relative: 0.8 %
Eosinophils Absolute: 224 cells/uL (ref 15–500)
Eosinophils Relative: 3.5 %
HCT: 40.9 % (ref 35.0–45.0)
Hemoglobin: 13.7 g/dL (ref 11.7–15.5)
Lymphs Abs: 2592 cells/uL (ref 850–3900)
MCH: 28.5 pg (ref 27.0–33.0)
MCHC: 33.5 g/dL (ref 32.0–36.0)
MCV: 85 fL (ref 80.0–100.0)
MPV: 10.1 fL (ref 7.5–12.5)
Monocytes Relative: 6.6 %
Neutro Abs: 3110 cells/uL (ref 1500–7800)
Neutrophils Relative %: 48.6 %
Platelets: 246 10*3/uL (ref 140–400)
RBC: 4.81 10*6/uL (ref 3.80–5.10)
RDW: 13.3 % (ref 11.0–15.0)
Total Lymphocyte: 40.5 %
WBC: 6.4 10*3/uL (ref 3.8–10.8)

## 2020-01-20 LAB — LIPID PANEL
Cholesterol: 224 mg/dL — ABNORMAL HIGH (ref <200)
HDL: 47 mg/dL — ABNORMAL LOW (ref >=50)
LDL Cholesterol (Calc): 146 mg/dL (calc) — ABNORMAL HIGH
Non-HDL Cholesterol (Calc): 177 mg/dL (calc) — ABNORMAL HIGH (ref <130)
Total CHOL/HDL Ratio: 4.8 (calc) (ref <5.0)
Triglycerides: 171 mg/dL — ABNORMAL HIGH (ref <150)

## 2020-01-20 LAB — HEMOGLOBIN A1C
Hgb A1c MFr Bld: 6 % of total Hgb — ABNORMAL HIGH (ref <5.7)
Mean Plasma Glucose: 126 (calc)
eAG (mmol/L): 7 (calc)

## 2020-01-28 ENCOUNTER — Other Ambulatory Visit: Payer: Self-pay

## 2020-01-28 ENCOUNTER — Other Ambulatory Visit
Admission: RE | Admit: 2020-01-28 | Discharge: 2020-01-28 | Disposition: A | Discharge status: Home / Self Care | Payer: BC Managed Care – PPO | Source: Ambulatory Visit | Attending: General Surgery | Admitting: General Surgery

## 2020-01-28 DIAGNOSIS — Z20822 Contact with and (suspected) exposure to covid-19: Secondary | ICD-10-CM | POA: Diagnosis not present

## 2020-01-28 DIAGNOSIS — Z01812 Encounter for preprocedural laboratory examination: Secondary | ICD-10-CM | POA: Diagnosis present

## 2020-01-29 LAB — SARS CORONAVIRUS 2 (TAT 6-24 HRS): SARS Coronavirus 2: NEGATIVE

## 2020-02-01 LAB — HM COLONOSCOPY

## 2020-03-01 ENCOUNTER — Other Ambulatory Visit: Payer: Self-pay | Admitting: Family Medicine

## 2020-03-01 DIAGNOSIS — I10 Essential (primary) hypertension: Secondary | ICD-10-CM

## 2020-06-30 ENCOUNTER — Ambulatory Visit: Payer: BC Managed Care – PPO | Admitting: Family Medicine

## 2020-10-21 ENCOUNTER — Other Ambulatory Visit: Payer: Self-pay | Admitting: General Surgery

## 2020-10-21 DIAGNOSIS — Z1231 Encounter for screening mammogram for malignant neoplasm of breast: Secondary | ICD-10-CM

## 2020-11-09 ENCOUNTER — Ambulatory Visit
Admission: RE | Admit: 2020-11-09 | Discharge: 2020-11-09 | Disposition: A | Discharge status: Home / Self Care | Payer: BC Managed Care – PPO | Source: Ambulatory Visit | Attending: General Surgery | Admitting: General Surgery

## 2020-11-09 ENCOUNTER — Encounter: Payer: Self-pay | Admitting: Internal Medicine

## 2020-11-09 ENCOUNTER — Other Ambulatory Visit: Payer: Self-pay

## 2020-11-09 DIAGNOSIS — Z1231 Encounter for screening mammogram for malignant neoplasm of breast: Secondary | ICD-10-CM | POA: Diagnosis not present

## 2021-01-05 ENCOUNTER — Other Ambulatory Visit: Payer: Self-pay

## 2021-01-05 ENCOUNTER — Encounter: Payer: Self-pay | Admitting: Unknown Physician Specialty

## 2021-01-05 ENCOUNTER — Ambulatory Visit (INDEPENDENT_AMBULATORY_CARE_PROVIDER_SITE_OTHER): Payer: BC Managed Care – PPO | Admitting: Unknown Physician Specialty

## 2021-01-05 VITALS — BP 124/76 | HR 98 | Temp 98.1°F | Resp 16 | Ht 67.0 in | Wt 186.3 lb

## 2021-01-05 DIAGNOSIS — R809 Proteinuria, unspecified: Secondary | ICD-10-CM

## 2021-01-05 DIAGNOSIS — R7303 Prediabetes: Secondary | ICD-10-CM

## 2021-01-05 DIAGNOSIS — E782 Mixed hyperlipidemia: Secondary | ICD-10-CM

## 2021-01-05 DIAGNOSIS — I10 Essential (primary) hypertension: Secondary | ICD-10-CM

## 2021-01-05 MED ORDER — AMLODIPINE BESY-BENAZEPRIL HCL 5-10 MG PO CAPS: 1.0000 | ORAL_CAPSULE | Freq: Every day | ORAL | 3 refills | Status: DC

## 2021-01-05 NOTE — Assessment & Plan Note (Addendum)
LDL is 140's.  Check lipid panel today.  States parents and aunt are both statin intolerant.

## 2021-01-05 NOTE — Assessment & Plan Note (Addendum)
Check Hgb A1C today.  Last one was 6.0

## 2021-01-05 NOTE — Assessment & Plan Note (Signed)
Stable, continue present medications.   

## 2021-01-05 NOTE — Progress Notes (Signed)
BP 124/76   Pulse 98   Temp 98.1 F (36.7 C)   Resp 16   Ht '5\' 7"'$  (1.702 m)   Wt 186 lb 4.8 oz (84.5 kg)   SpO2 98%   BMI 29.18 kg/m    Subjective:    Patient ID: Shirley Blackwell, female    DOB: [birth year], 65 y.o.   MRN: [id number]  HPI: Shirley Blackwell is a 65 y.o. female  Chief Complaint  Patient presents with   Hypertension   Hypertension Using medications without difficulty.  Taking Lotrel but off of Triamterene Average home BPs   No problems or lightheadedness No chest pain with exertion or shortness of breath No Edema  The 10-year ASCVD risk score Mikey Bussing DC Jr., et al., 2013) is: 6.6%   Values used to calculate the score:     Age: 62 years     Sex: Female     Is Non-Hispanic African American: No     Diabetic: No     Tobacco smoker: No     Systolic Blood Pressure: [id number] mmHg     Is BP treated: Yes     HDL Cholesterol: 47 mg/dL     Total Cholesterol: 224 mg/dL  Protein in urine when being seen for a UTI while at urgent care  Relevant past medical, surgical, family and social history reviewed and updated as indicated. Interim medical history since our last visit reviewed. Allergies and medications reviewed and updated.  Review of Systems  Per HPI unless specifically indicated above     Objective:    BP 124/76   Pulse 98   Temp 98.1 F (36.7 C)   Resp 16   Ht '5\' 7"'$  (1.702 m)   Wt 186 lb 4.8 oz (84.5 kg)   SpO2 98%   BMI 29.18 kg/m   Wt Readings from Last 3 Encounters:  01/05/21 186 lb 4.8 oz (84.5 kg)  12/29/19 183 lb 3.2 oz (83.1 kg)  12/02/19 188 lb (85.3 kg)    Physical Exam Constitutional:      General: She is not in acute distress.    Appearance: Normal appearance. She is well-developed.  HENT:     Head: Normocephalic and atraumatic.  Eyes:     General: Lids are normal. No scleral icterus.       Right eye: No discharge.        Left eye: No discharge.     Conjunctiva/sclera: Conjunctivae normal.  Neck:     Vascular: No carotid bruit or  JVD.  Cardiovascular:     Rate and Rhythm: Normal rate and regular rhythm.     Heart sounds: Normal heart sounds.  Pulmonary:     Effort: Pulmonary effort is normal. No respiratory distress.     Breath sounds: Normal breath sounds.  Abdominal:     Palpations: There is no hepatomegaly or splenomegaly.  Musculoskeletal:        General: Normal range of motion.     Cervical back: Normal range of motion and neck supple.  Skin:    General: Skin is warm and dry.     Coloration: Skin is not pale.     Findings: No rash.  Neurological:     Mental Status: She is alert and oriented to person, place, and time.  Psychiatric:        Behavior: Behavior normal.        Thought Content: Thought content normal.        Judgment: Judgment normal.  Results for orders placed or performed during the hospital encounter of 01/28/20  SARS CORONAVIRUS 2 (TAT 6-24 HRS) Nasopharyngeal Nasopharyngeal Swab   Specimen: Nasopharyngeal Swab  Result Value Ref Range   SARS Coronavirus 2 NEGATIVE NEGATIVE      Assessment & Plan:   Problem List Items Addressed This Visit       Unprioritized   Benign essential HTN    Stable, continue present medications.         Relevant Medications   amLODipine-benazepril (LOTREL) 5-10 MG capsule   Other Relevant Orders   COMPLETE METABOLIC PANEL WITH GFR   Mixed hyperlipidemia    LDL is 140's.  Check lipid panel today.  States parents and aunt are both statin intolerant.         Relevant Medications   amLODipine-benazepril (LOTREL) 5-10 MG capsule   Other Relevant Orders   Lipid panel   Prediabetes    Check Hgb A1C today.  Last one was 6.0       Relevant Orders   Hemoglobin A1c   Other Visit Diagnoses     Proteinuria, unspecified type    -  Primary   Noted in urgent care.  Check urinalysis and note GFR   Relevant Orders   Urinalysis        Follow up plan: Return for physical.

## 2021-01-06 LAB — COMPLETE METABOLIC PANEL WITH GFR
AG Ratio: 1.3 (calc) (ref 1.0–2.5)
ALT: 16 U/L (ref 6–29)
AST: 16 U/L (ref 10–35)
Albumin: 4.1 g/dL (ref 3.6–5.1)
Alkaline phosphatase (APISO): 95 U/L (ref 37–153)
BUN: 11 mg/dL (ref 7–25)
CO2: 26 mmol/L (ref 20–32)
Calcium: 9.4 mg/dL (ref 8.6–10.4)
Chloride: 106 mmol/L (ref 98–110)
Creat: 0.72 mg/dL (ref 0.50–1.05)
Globulin: 3.1 g/dL (calc) (ref 1.9–3.7)
Glucose, Bld: 88 mg/dL (ref 65–99)
Potassium: 3.7 mmol/L (ref 3.5–5.3)
Sodium: 141 mmol/L (ref 135–146)
Total Bilirubin: 0.6 mg/dL (ref 0.2–1.2)
Total Protein: 7.2 g/dL (ref 6.1–8.1)
eGFR: 94 mL/min/{1.73_m2} (ref >=60)

## 2021-01-06 LAB — LIPID PANEL
Cholesterol: 221 mg/dL — ABNORMAL HIGH (ref <200)
HDL: 48 mg/dL — ABNORMAL LOW (ref >=50)
LDL Cholesterol (Calc): 141 mg/dL (calc) — ABNORMAL HIGH
Non-HDL Cholesterol (Calc): 173 mg/dL (calc) — ABNORMAL HIGH (ref <130)
Total CHOL/HDL Ratio: 4.6 (calc) (ref <5.0)
Triglycerides: 186 mg/dL — ABNORMAL HIGH (ref <150)

## 2021-01-06 LAB — URINALYSIS
Bilirubin Urine: NEGATIVE
Glucose, UA: NEGATIVE
Hgb urine dipstick: NEGATIVE
Ketones, ur: NEGATIVE
Nitrite: NEGATIVE
Specific Gravity, Urine: 1.012 (ref 1.001–1.035)
pH: 5.5 (ref 5.0–8.0)

## 2021-01-06 LAB — HEMOGLOBIN A1C
Hgb A1c MFr Bld: 5.9 % of total Hgb — ABNORMAL HIGH (ref <5.7)
Mean Plasma Glucose: 123 mg/dL
eAG (mmol/L): 6.8 mmol/L

## 2021-01-26 ENCOUNTER — Ambulatory Visit (INDEPENDENT_AMBULATORY_CARE_PROVIDER_SITE_OTHER): Payer: BC Managed Care – PPO | Admitting: Unknown Physician Specialty

## 2021-01-26 ENCOUNTER — Encounter: Payer: Self-pay | Admitting: Unknown Physician Specialty

## 2021-01-26 ENCOUNTER — Other Ambulatory Visit (HOSPITAL_COMMUNITY)
Admission: RE | Admit: 2021-01-26 | Discharge: 2021-01-26 | Disposition: A | Discharge status: Home / Self Care | Payer: BC Managed Care – PPO | Source: Ambulatory Visit | Attending: Unknown Physician Specialty | Admitting: Unknown Physician Specialty

## 2021-01-26 ENCOUNTER — Other Ambulatory Visit: Payer: Self-pay

## 2021-01-26 VITALS — BP 124/76 | HR 98 | Temp 98.6°F | Resp 16 | Ht 67.0 in | Wt 185.9 lb

## 2021-01-26 DIAGNOSIS — Z0000 Encounter for general adult medical examination without abnormal findings: Secondary | ICD-10-CM | POA: Diagnosis not present

## 2021-01-26 DIAGNOSIS — Z124 Encounter for screening for malignant neoplasm of cervix: Secondary | ICD-10-CM | POA: Insufficient documentation

## 2021-01-26 DIAGNOSIS — R319 Hematuria, unspecified: Secondary | ICD-10-CM | POA: Diagnosis not present

## 2021-01-26 NOTE — Addendum Note (Signed)
Addended by: Kathrine Haddock on: 01/26/2021 04:01 PM   Modules accepted: Orders

## 2021-01-26 NOTE — Progress Notes (Signed)
BP 124/76   Pulse 98   Temp 98.6 F (37 C) (Oral)   Resp 16   Ht '5\' 7"'$  (1.702 m)   Wt 185 lb 14.4 oz (84.3 kg)   SpO2 95%   BMI 29.12 kg/m    Subjective:    Patient ID: Shirley Blackwell, female    DOB: 1956/04/23, 65 y.o.   MRN: [id number]  HPI: Shirley Blackwell is a 65 y.o. female  Chief Complaint  Patient presents with   Annual Exam   Hypertension Using medications without difficulty Average home BPs   No problems or lightheadedness No chest pain with exertion or shortness of breath No Edema  The 10-year ASCVD risk score Mikey Bussing DC Jr., et al., 2013) is: 6.5%   Values used to calculate the score:     Age: 39 years     Sex: Female     Is Non-Hispanic African American: No     Diabetic: No     Tobacco smoker: No     Systolic Blood Pressure: [id number] mmHg     Is BP treated: Yes     HDL Cholesterol: 48 mg/dL     Total Cholesterol: 221 mg/dL  Social History   Socioeconomic History   Marital status: Married    Spouse name: Not on file   Number of children: Not on file   Years of education: Not on file   Highest education level: Not on file  Occupational History   Not on file  Tobacco Use   Smoking status: Never   Smokeless tobacco: Never  Vaping Use   Vaping Use: Never used  Substance and Sexual Activity   Alcohol use: No   Drug use: No   Sexual activity: Yes  Other Topics Concern   Not on file  Social History Narrative   Not on file   Social Determinants of Health   Financial Resource Strain: Low Risk    Difficulty of Paying Living Expenses: Not hard at all  Food Insecurity: No Food Insecurity   Worried About Charity fundraiser in the Last Year: Never true   Ran Out of Food in the Last Year: Never true  Transportation Needs: No Transportation Needs   Lack of Transportation (Medical): No   Lack of Transportation (Non-Medical): No  Physical Activity: Insufficiently Active   Days of Exercise per Week: 3 days   Minutes of Exercise per Session: 20 min  Stress:  No Stress Concern Present   Feeling of Stress : Not at all  Social Connections: Socially Integrated   Frequency of Communication with Friends and Family: More than three times a week   Frequency of Social Gatherings with Friends and Family: Twice a week   Attends Religious Services: 1 to 4 times per year   Active Member of [employer] or Organizations: Yes   Attends Music therapist: More than 4 times per year   Marital Status: Married  Human resources officer Violence: Not At Risk   Fear of Current or Ex-Partner: No   Emotionally Abused: No   Physically Abused: No   Sexually Abused: No   Family History  Problem Relation Age of Onset   Cancer Sister        uterian   Diabetes Mother    Hypertension Mother    Kidney disease Mother    Hypertension Father    Prostate cancer Father    Diabetes Maternal Grandmother    Heart failure Maternal Grandmother    Heart  failure Paternal Grandfather    Breast cancer Neg Hx    Past Medical History:  Diagnosis Date   Breast cancer ([place]) 2012   Right breast- chemo   Cancer ([place]) 2012   right mastectomy invasive lobular CA, 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   Cancer of right breast ([place])    right mastectomy invasive lobular CA,    Colon cancer ([place])    Pre cancerous polyps   Complication of anesthesia    Hepatitis    Hx of lumpectomy 2012   right   Hypertension 1999   Personal history of malignant neoplasm of breast 2012   R Mastectomy,   PONV (postoperative nausea and vomiting)    Prediabetes 12/06/2014   Shingles    Past Surgical History:  Procedure Laterality Date   BREAST BIOPSY Left 2000, 2002   core bx- neg   CHOLECYSTECTOMY N/A 12/08/2019   Procedure: LAPAROSCOPIC CHOLECYSTECTOMY WITH INTRAOPERATIVE CHOLANGIOGRAM;  Surgeon: Robert Bellow, MD;  Location: [facility] ORS;  Service: General;  Laterality: N/A;   COLECTOMY Right 12/14/2011   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  0/10 lymph  nodes.   COLONOSCOPY  2013, 2014   Dr. Jamal Collin   KNEE SURGERY Left 2014   MASTECTOMY Right 2012   invasive lobular CA,    Relevant past medical, surgical, family and social history reviewed and updated as indicated. Interim medical history since our last visit reviewed. Allergies and medications reviewed and updated.  Review of Systems  Constitutional: Negative.   HENT: Negative.    Eyes: Negative.   Respiratory: Negative.    Cardiovascular: Negative.   Gastrointestinal: Negative.   Endocrine: Negative.   Genitourinary: Negative.        Frequent UTIs.  Recent symptoms with UC visit which includes hematuria.  Negative cultures but partial sx relief with antibiotics.  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    Musculoskeletal: Negative.   Skin: Negative.   Allergic/Immunologic: Negative.   Neurological: Negative.   Hematological: Negative.   Psychiatric/Behavioral: Negative.     Per HPI unless specifically indicated above     Objective:    BP 124/76   Pulse 98   Temp 98.6 F (37 C) (Oral)   Resp 16   Ht '5\' 7"'$  (1.702 m)   Wt 185 lb 14.4 oz (84.3 kg)   SpO2 95%   BMI 29.12 kg/m   Wt Readings from Last 3 Encounters:  01/26/21 185 lb 14.4 oz (84.3 kg)  01/05/21 186 lb 4.8 oz (84.5 kg)  12/29/19 183 lb 3.2 oz (83.1 kg)    Physical Exam Constitutional:      General: She is not in acute distress.    Appearance: Normal appearance. She is well-developed. She is not ill-appearing.  HENT:     Head: Normocephalic and atraumatic.     Right Ear: Tympanic membrane normal.     Left Ear: Tympanic membrane normal.     Nose: Nose normal.     Mouth/Throat:     Mouth: Mucous membranes are dry.  Eyes:     General: No scleral icterus.       Right eye: No discharge.        Left eye: No discharge.     Pupils: Pupils are equal, round, and reactive to light.  Neck:     Thyroid: No thyromegaly.     Vascular: No carotid bruit.  Cardiovascular:     Rate and Rhythm: Normal rate and  regular rhythm.  Heart sounds: Normal heart sounds. No murmur heard.   No friction rub. No gallop.  Pulmonary:     Effort: Pulmonary effort is normal. No respiratory distress.     Breath sounds: Normal breath sounds. No wheezing or rales.  Chest:  Breasts:    Right: No mass, nipple discharge or skin change.     Left: No mass, nipple discharge or skin change.  Abdominal:     General: Bowel sounds are normal.     Palpations: Abdomen is soft.     Tenderness: There is no abdominal tenderness. There is no right CVA tenderness, left CVA tenderness or rebound.  Genitourinary:    Vagina: Normal.     Cervix: No cervical motion tenderness, discharge or friability.     Adnexa:        Right: No mass, tenderness or fullness.         Left: No mass, tenderness or fullness.    Musculoskeletal:        General: Normal range of motion.     Cervical back: Normal range of motion and neck supple.  Lymphadenopathy:     Cervical: No cervical adenopathy.  Skin:    General: Skin is warm and dry.     Findings: No rash.  Neurological:     Mental Status: She is alert and oriented to person, place, and time.  Psychiatric:        Speech: Speech normal.        Behavior: Behavior normal.        Thought Content: Thought content normal.        Judgment: Judgment normal.   Breast exam done with Dr Fleet Contras  Results for orders placed or performed in visit on 01/05/21  HM COLONOSCOPY  Result Value Ref Range   HM Colonoscopy See Report (in chart) See Report (in chart), Patient Reported      Assessment & Plan:   Problem List Items Addressed This Visit   None Visit Diagnoses     Annual physical exam    -  Primary   Hematuria, unspecified type       Recent history with UTI symptoms.     Relevant Orders   Ambulatory referral to Urology       HM: Recommended shingles vaccine Td due next year Covid vaccine hesitant Needs second 1st and second Zostavax Not interested in STD, HIV or Hep C  Info  or OTC supplements for cholesterol lowerin   Follow up plan: Return in about 6 months (around 07/29/2021).

## 2021-01-26 NOTE — Patient Instructions (Signed)
Metamucil Oatmeal Plant Sterols Red yeast rice

## 2021-01-31 LAB — CYTOLOGY - PAP
Comment: NEGATIVE
Diagnosis: NEGATIVE
High risk HPV: NEGATIVE

## 2021-02-07 NOTE — Progress Notes (Signed)
02/08/21 3:45 PM   Shirley Blackwell [birth year] [id number]  Referring provider:  Kathrine Haddock, NP [address],  [place] [address] Chief Complaint  Patient presents with   Hematuria     HPI: Shirley Blackwell is a 65 y.o.female who presents today for further evaluation of bladder infections.   Her most recent urinalysis on 01/05/2021 showed trace protein and 1+ leukocytes.   During her 01/26/2021 annual physical exam with Kathrine Haddock NP, she was diagnosed with hematuria in recent history with UTI symptoms and referred to Urology.     PVR today is 0. Urinalysis today had few WBC but was otherwise unremarkable.   She reports today that she had three bad infection this year and has been treated at [facility]. She states she was prescribed doxycycline and she still felt as if the infection was still there. She went back to [facility] and was then prescribed Cipro.   She believes that some of her UTIs symptoms are due to sexual intercourse. She denies any modifying or aggravating factors, suprapubic/flank pain.  She denies any fevers, chills, nausea or vomiting.   Personal history of breast cancer.   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   Cancer of right breast ([place])    right mastectomy invasive lobular CA,    Colon cancer ([place])    Pre cancerous polyps   Complication of anesthesia    Hepatitis    Hx of lumpectomy 2012   right   Hypertension 1999   Personal history of malignant neoplasm of breast 2012   R Mastectomy,   PONV (postoperative nausea and vomiting)    Prediabetes 12/06/2014   Shingles     Surgical History: Past Surgical History:  Procedure Laterality Date   BREAST BIOPSY Left 2000, 2002   core bx- neg   CHOLECYSTECTOMY N/A 12/08/2019   Procedure: LAPAROSCOPIC CHOLECYSTECTOMY WITH INTRAOPERATIVE CHOLANGIOGRAM;  Surgeon:  Robert Bellow, MD;  Location: [facility] ORS;  Service: General;  Laterality: N/A;   COLECTOMY Right 12/14/2011   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  0/10 lymph nodes.   COLONOSCOPY  2013, 2014   Dr. Jamal Collin   KNEE SURGERY Left 2014   MASTECTOMY Right 2012   invasive lobular CA,    Home Medications:  Allergies as of 02/08/2021       Reactions   Macrobid [nitrofurantoin Macrocrystal]    Sulfa Antibiotics Hives   Other reaction(s): Hives   Tape Other (See Comments)   blisters        Medication List        Accurate as of February 08, 2021  3:45 PM. If you have any questions, ask your nurse or doctor.          amLODipine-benazepril 5-10 MG capsule Commonly known as: LOTREL Take 1 capsule by mouth daily.   diphenhydrAMINE 25 MG tablet Commonly known as: BENADRYL Take 50 mg by mouth daily.   GLUCOSAMINE CHOND COMPLEX/MSM PO Take 2 tablets by mouth daily.        Allergies:  Allergies  Allergen Reactions   Macrobid [Nitrofurantoin Macrocrystal]    Sulfa Antibiotics Hives    Other reaction(s): Hives   Tape Other (See Comments)    blisters    Family History: Family History  Problem Relation Age of Onset  Diabetes Mother    Hypertension Mother    Kidney disease Mother    Hypertension Father    Prostate cancer Father    Cancer Sister        Theadora Rama   Diabetes Maternal Grandmother    Heart failure Maternal Grandmother    Heart failure Paternal Grandfather    Breast cancer Neg Hx     Social History:  reports that she has never smoked. She has never used smokeless tobacco. She reports that she does not drink alcohol and does not use drugs.   Physical Exam: BP (!) 147/89   Pulse 64   Ht '5\' 7"'$  (1.702 m)   Wt 185 lb (83.9 kg)   BMI 28.98 kg/m   Constitutional:  Alert and oriented, No acute distress. HEENT: [place] AT, moist mucus membranes.  Trachea midline, no masses. Cardiovascular: No clubbing, cyanosis,  or edema. Respiratory: Normal respiratory effort, no increased work of breathing. Skin: No rashes, bruises or suspicious lesions. Neurologic: Grossly intact, no focal deficits, moving all 4 extremities. Psychiatric: Normal mood and affect.  Laboratory Data:  Lab Results  Component Value Date   CREATININE 0.72 01/05/2021     Lab Results  Component Value Date   HGBA1C 5.9 (H) 01/05/2021    Urinalysis Urinalysis today showed no hematuria or infection.   Pertinent Imaging: Results for orders placed or performed in visit on 02/08/21  BLADDER SCAN AMB NON-IMAGING  Result Value Ref Range   Scan Result 0 ml      Assessment & Plan:   Recurrent UTI  -  Counseled her on preventative steps for UTIs such as cranberry tablets, probiotics (yogurt, look specifically for lactobacillus), and increase water intake. Twice daily cranberry tablets. - Signed a release form to review records from [facility].  -hold off on further evaluation at this time -hygiene related care, about loose stool as possible  - Urinalysis today was negative for infection and hematuria - return if she has any UTI symptoms , will escalate with upper tract imaging and cysto as needed if true recurrent infections -not ideal candidate for estrogen given breast cancer risk, ER +  2. Stress urinary incontinence  - she has minimal bother  - often does pelvic floor exercises   F/u as needed  I,Kailey Littlejohn,acting as a scribe for Hollice Espy, MD.,have documented all relevant documentation on the behalf of Hollice Espy, MD,as directed by  Hollice Espy, MD while in the presence of Hollice Espy, MD.  I have reviewed the above documentation for accuracy and completeness, and I agree with the above.   Hollice Espy, MD   [facility] [address] [place], [place] [address] [phone]

## 2021-02-08 ENCOUNTER — Other Ambulatory Visit: Payer: Self-pay

## 2021-02-08 ENCOUNTER — Ambulatory Visit (INDEPENDENT_AMBULATORY_CARE_PROVIDER_SITE_OTHER): Payer: BC Managed Care – PPO | Admitting: Urology

## 2021-02-08 ENCOUNTER — Encounter: Payer: Self-pay | Admitting: Urology

## 2021-02-08 VITALS — BP 147/89 | HR 64 | Ht 67.0 in | Wt 185.0 lb

## 2021-02-08 DIAGNOSIS — N390 Urinary tract infection, site not specified: Secondary | ICD-10-CM | POA: Diagnosis not present

## 2021-02-08 DIAGNOSIS — R319 Hematuria, unspecified: Secondary | ICD-10-CM | POA: Diagnosis not present

## 2021-02-08 LAB — BLADDER SCAN AMB NON-IMAGING: Scan Result: 0

## 2021-02-08 NOTE — Patient Instructions (Signed)
Take probiotics daily and Cranberry tablets as directed.  Please call our office with any UTI symptoms for same day or next day appointments.

## 2021-02-10 LAB — MICROSCOPIC EXAMINATION

## 2021-02-10 LAB — URINALYSIS, COMPLETE
Bilirubin, UA: NEGATIVE
Glucose, UA: NEGATIVE
Ketones, UA: NEGATIVE
Nitrite, UA: NEGATIVE
Specific Gravity, UA: 1.015 (ref 1.005–1.030)
Urobilinogen, Ur: 0.2 mg/dL (ref 0.2–1.0)
pH, UA: 6 (ref 5.0–7.5)

## 2021-02-12 LAB — CULTURE, URINE COMPREHENSIVE

## 2021-02-14 ENCOUNTER — Other Ambulatory Visit: Payer: Self-pay | Admitting: Family Medicine

## 2021-02-14 DIAGNOSIS — I10 Essential (primary) hypertension: Secondary | ICD-10-CM

## 2021-07-27 ENCOUNTER — Ambulatory Visit: Payer: BC Managed Care – PPO | Admitting: Internal Medicine

## 2021-09-11 ENCOUNTER — Ambulatory Visit: Payer: BC Managed Care – PPO | Admitting: Family Medicine

## 2021-09-12 ENCOUNTER — Encounter: Payer: Self-pay | Admitting: Family Medicine

## 2021-09-12 ENCOUNTER — Ambulatory Visit (INDEPENDENT_AMBULATORY_CARE_PROVIDER_SITE_OTHER): Payer: BC Managed Care – PPO | Admitting: Family Medicine

## 2021-09-12 VITALS — BP 112/70 | HR 90 | Temp 97.6°F | Resp 16 | Ht 66.5 in | Wt 196.9 lb

## 2021-09-12 DIAGNOSIS — Z114 Encounter for screening for human immunodeficiency virus [HIV]: Secondary | ICD-10-CM

## 2021-09-12 DIAGNOSIS — R809 Proteinuria, unspecified: Secondary | ICD-10-CM

## 2021-09-12 DIAGNOSIS — M79605 Pain in left leg: Secondary | ICD-10-CM

## 2021-09-12 DIAGNOSIS — Z1159 Encounter for screening for other viral diseases: Secondary | ICD-10-CM

## 2021-09-12 DIAGNOSIS — R252 Cramp and spasm: Secondary | ICD-10-CM

## 2021-09-12 DIAGNOSIS — R7303 Prediabetes: Secondary | ICD-10-CM

## 2021-09-12 DIAGNOSIS — I10 Essential (primary) hypertension: Secondary | ICD-10-CM | POA: Diagnosis not present

## 2021-09-12 DIAGNOSIS — G2581 Restless legs syndrome: Secondary | ICD-10-CM

## 2021-09-12 DIAGNOSIS — E782 Mixed hyperlipidemia: Secondary | ICD-10-CM

## 2021-09-12 DIAGNOSIS — M79604 Pain in right leg: Secondary | ICD-10-CM

## 2021-09-12 DIAGNOSIS — Z23 Encounter for immunization: Secondary | ICD-10-CM

## 2021-09-12 NOTE — Progress Notes (Signed)
? ?Name: Shirley Blackwell   MRN: [id number]    DOB: May 14, 1956   Date:09/12/2021 ? ?     Progress Note ? ?Chief Complaint  ?Patient presents with  ? Follow-up  ? Hypertension  ? Hyperlipidemia  ? PreDM  ? ? ? ?Subjective:  ? ?Shirley Blackwell is a 66 y.o. female, presents to clinic for routine f/up ? ?Hypertension:  ?Currently managed on amlodipine-benazepril ?Pt reports good med compliance and denies any SE.   ?Blood pressure today is well controlled. ?BP Readings from Last 3 Encounters:  ?09/12/21 112/70  ?02/08/21 (!) 147/89  ?01/26/21 124/76  ? ?Pt denies CP, SOB, exertional sx, LE edema, palpitation, Ha's, visual disturbances, lightheadedness, hypotension, syncope. ? ? ?Hyperlipidemia: ?Not on meds ?Last Lipids: ?Lab Results  ?Component Value Date  ? CHOL 221 (H) 01/05/2021  ? HDL 48 (L) 01/05/2021  ? LDLCALC 141 (H) 01/05/2021  ? TRIG 186 (H) 01/05/2021  ? CHOLHDL 4.6 01/05/2021  ? ?- Denies: Chest pain, shortness of breath, myalgias, claudication ? ?Prediabetes - having urinary frequency which she believes is related to her blood sugars ?Lab Results  ?Component Value Date  ? HGBA1C 5.9 (H) 01/05/2021  ? ?Leg aching muscle spasms cramping and discomfort mostly at night ?She states that she was previously given prescription potassium to take a few times a month and it always relieved her leg symptoms ?I previously checked her potassium levels and change her blood pressure medications and she did not require prescription potassium replacement so she has been doing an over-the-counter supplement she believes this is why her leg symptoms are getting worse she has no history of iron or B12 deficiency she denies any swelling, denies any low back pain or radicular symptoms ? ?Frequent UTI -this has been improved with the use of a cranberry supplement and she has consulted with urology, Dr. Erlene Quan ?Last UTI was about 8 months ago she has several labs that showed some proteinuria ? ?Proteinuria - no past nephrology  consult ?Trace protein in last UA in the setting of UTI ?Last labs renal function ?Lab Results  ?Component Value Date  ? EGFR 94 01/05/2021  ? ? ? ? ? ?Current Outpatient Medications:  ?  amLODipine-benazepril (LOTREL) 5-10 MG capsule, Take 1 capsule by mouth daily., Disp: 90 capsule, Rfl: 3 ?  AZO-CRANBERRY PO, Take by mouth., Disp: , Rfl:  ?  BLACK CURRANT SEED OIL PO, Take 200 mg by mouth., Disp: , Rfl:  ?  diphenhydrAMINE (BENADRYL) 25 MG tablet, Take 50 mg by mouth daily. , Disp: , Rfl:  ?  Misc Natural Products (GLUCOSAMINE CHOND COMPLEX/MSM PO), Take 2 tablets by mouth daily., Disp: , Rfl:  ?  Potassium 75 MG TABS, Take by mouth., Disp: , Rfl:  ? ?Patient Active Problem List  ? Diagnosis Date Noted  ? 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 Overweight (BMI 25.0-29.9) 11/18/2017  ? Carcinoma of upper-outer quadrant of right breast in female, estrogen receptor positive ([place]) 12/23/2015  ? Prediabetes 12/06/2014  ? 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 Stress at home 12/06/2014  ? History of colonic polyps 10/02/2012  ? Personal history of malignant neoplasm of breast   ? Benign essential HTN 11/09/2006  ? ? ?Past Surgical History:  ?Procedure Laterality Date  ? BREAST BIOPSY Left 2000, 2002  ? core bx- neg  ? CHOLECYSTECTOMY N/A 12/08/2019  ? Procedure: LAPAROSCOPIC CHOLECYSTECTOMY WITH INTRAOPERATIVE CHOLANGIOGRAM;  Surgeon: Robert Bellow,  MD;  Location: [facility] ORS;  Service: General;  Laterality: N/A;  ? COLECTOMY Right 12/14/2011  ? 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  0/10 lymph nodes.  ? COLONOSCOPY  2013, 2014  ? Dr. Jamal Collin  ? KNEE SURGERY Left 2014  ? MASTECTOMY Right 2012  ? invasive lobular CA,  ? ? ?Family History  ?Problem Relation Age of Onset  ? Diabetes Mother   ? Hypertension Mother   ? Kidney disease Mother   ? Hypertension Father   ? Prostate cancer Father   ? Cancer Sister    ?     uterian  ? Diabetes Maternal Grandmother   ? Heart failure Maternal Grandmother   ? Heart failure Paternal Grandfather   ? Breast cancer Neg Hx   ? ? ?Social History  ? ?Tobacco Use  ? Smoking status: Never  ? Smokeless tobacco: Never  ?Vaping Use  ? Vaping Use: Never used  ?Substance Use Topics  ? Alcohol use: No  ? Drug use: No  ?  ? ?Allergies  ?Allergen Reactions  ? Macrobid [Nitrofurantoin Macrocrystal]   ? Sulfa Antibiotics Hives  ?  Other reaction(s): Hives  ? Tape Other (See Comments)  ?  blisters  ? ? ?Health Maintenance  ?Topic Date Due  ? Hepatitis C Screening  Never done  ? Zoster Vaccines- Shingrix (1 of 2) 12/12/2021 (Originally 07/22/1976)  ? MAMMOGRAM  11/09/2021  ? TETANUS/TDAP  11/27/2021  ? INFLUENZA VACCINE  01/09/2022  ? PAP SMEAR-Modifier  01/27/2024  ? COLONOSCOPY (Pts 45-56yr Insurance coverage will need to be confirmed)  01/31/2025  ? HIV Screening  Completed  ? HPV VACCINES  Aged Out  ? COVID-19 Vaccine  Discontinued  ? ? ?Chart Review Today: ?I personally reviewed active problem list, medication list, allergies, family history, social history, health maintenance, notes from last encounter, lab results, imaging with the patient/caregiver today. ? ? ?Review of Systems  ?Constitutional: Negative.   ?HENT: Negative.    ?Eyes: Negative.   ?Respiratory: Negative.    ?Cardiovascular: Negative.   ?Gastrointestinal: Negative.   ?Endocrine: Negative.   ?Genitourinary: Negative.   ?Musculoskeletal: Negative.   ?Skin: Negative.   ?Allergic/Immunologic: Negative.   ?Neurological: Negative.   ?Hematological: Negative.   ?Psychiatric/Behavioral: Negative.    ?All other systems reviewed and are negative.  ? ?Objective:  ? ?Vitals:  ? 09/12/21 1102  ?BP: 112/70  ?Pulse: 90  ?Resp: 16  ?Temp: 97.6 ?F (36.4 ?C)  ?TempSrc: Oral  ?SpO2: 95%  ?Weight: 196 lb 14.4 oz (89.3 kg)  ?Height: 5' 6.5" (1.689 m)  ? ? ?Body mass index is 31.3 kg/m?. ? ?Physical Exam ?Vitals and nursing note reviewed.   ?Constitutional:   ?   General: She is not in acute distress. ?   Appearance: Normal appearance. She is well-developed and well-groomed. She is obese. She is not ill-appearing, toxic-appearing or diaphoretic.  ?HENT:  ?   Head: Normocephalic and atraumatic.  ?   Right Ear: External ear normal.  ?   Left Ear: External ear normal.  ?Eyes:  ?   General: Lids are normal. No scleral icterus.    ?   Right eye: No discharge.     ?   Left eye: No discharge.  ?   Conjunctiva/sclera: Conjunctivae normal.  ?Neck:  ?   Trachea: Phonation normal. No tracheal deviation.  ?Cardiovascular:  ?   Rate and Rhythm: Normal rate and regular rhythm.  ?   Pulses: Normal  pulses.     ?     Radial pulses are 2+ on the right side and 2+ on the left side.  ?     Posterior tibial pulses are 2+ on the right side and 2+ on the left side.  ?   Heart sounds: Normal heart sounds. No murmur heard. ?  No friction rub. No gallop.  ?   Comments: Mild LE edema, non-pitting ?Pulmonary:  ?   Effort: Pulmonary effort is normal. No respiratory distress.  ?   Breath sounds: Normal breath sounds. No stridor. No wheezing, rhonchi or rales.  ?Chest:  ?   Chest wall: No tenderness.  ?Abdominal:  ?   General: Bowel sounds are normal. There is no distension.  ?   Palpations: Abdomen is soft.  ?   Tenderness: There is no abdominal tenderness. There is no guarding or rebound.  ?Musculoskeletal:     ?   General: No deformity. Normal range of motion.  ?   Cervical back: Normal range of motion and neck supple.  ?   Right lower leg: Edema present.  ?   Left lower leg: Edema present.  ?   Comments: B/L LE no ttp, muscle compartments soft  ?Lymphadenopathy:  ?   Cervical: No cervical adenopathy.  ?Skin: ?   General: Skin is warm and dry.  ?   Capillary Refill: Capillary refill takes less than 2 seconds.  ?   Coloration: Skin is not jaundiced or pale.  ?   Findings: No rash.  ?Neurological:  ?   Mental Status: She is alert and oriented to person, place, and time.  ?    Motor: No abnormal muscle tone.  ?   Gait: Gait normal.  ?Psychiatric:     ?   Speech: Speech normal.     ?   Behavior: Behavior normal. Behavior is cooperative.  ?  ? ? ? ? ?Assessment & Plan:  ? ?1. Benign essential HTN ?Blood

## 2021-09-12 NOTE — Patient Instructions (Signed)
You can try over the counter 400 mg supplement of magnesium to help with leg symptoms  ? ? ? ?

## 2021-09-13 LAB — URINALYSIS, ROUTINE W REFLEX MICROSCOPIC
Bacteria, UA: NONE SEEN /HPF
Bilirubin Urine: NEGATIVE
Glucose, UA: NEGATIVE
Hgb urine dipstick: NEGATIVE
Hyaline Cast: NONE SEEN /LPF
Ketones, ur: NEGATIVE
Nitrite: NEGATIVE
Specific Gravity, Urine: 1.022 (ref 1.001–1.035)
pH: 5.5 (ref 5.0–8.0)

## 2021-09-13 LAB — LIPID PANEL
Cholesterol: 241 mg/dL — ABNORMAL HIGH (ref <200)
HDL: 56 mg/dL (ref >=50)
LDL Cholesterol (Calc): 156 mg/dL (calc) — ABNORMAL HIGH
Non-HDL Cholesterol (Calc): 185 mg/dL (calc) — ABNORMAL HIGH (ref <130)
Total CHOL/HDL Ratio: 4.3 (calc) (ref <5.0)
Triglycerides: 158 mg/dL — ABNORMAL HIGH (ref <150)

## 2021-09-13 LAB — MICROALBUMIN / CREATININE URINE RATIO
Creatinine, Urine: 181 mg/dL (ref 20–275)
Microalb Creat Ratio: 185 mcg/mg creat — ABNORMAL HIGH (ref <30)
Microalb, Ur: 33.4 mg/dL

## 2021-09-13 LAB — COMPLETE METABOLIC PANEL WITH GFR
AG Ratio: 1.3 (calc) (ref 1.0–2.5)
ALT: 25 U/L (ref 6–29)
AST: 23 U/L (ref 10–35)
Albumin: 4.4 g/dL (ref 3.6–5.1)
Alkaline phosphatase (APISO): 96 U/L (ref 37–153)
BUN: 13 mg/dL (ref 7–25)
CO2: 27 mmol/L (ref 20–32)
Calcium: 9.5 mg/dL (ref 8.6–10.4)
Chloride: 104 mmol/L (ref 98–110)
Creat: 0.7 mg/dL (ref 0.50–1.05)
Globulin: 3.5 g/dL (calc) (ref 1.9–3.7)
Glucose, Bld: 112 mg/dL — ABNORMAL HIGH (ref 65–99)
Potassium: 3.6 mmol/L (ref 3.5–5.3)
Sodium: 141 mmol/L (ref 135–146)
Total Bilirubin: 0.5 mg/dL (ref 0.2–1.2)
Total Protein: 7.9 g/dL (ref 6.1–8.1)
eGFR: 97 mL/min/{1.73_m2} (ref >=60)

## 2021-09-13 LAB — CBC WITH DIFFERENTIAL/PLATELET
Absolute Monocytes: 362 cells/uL (ref 200–950)
Basophils Absolute: 61 cells/uL (ref 0–200)
Basophils Relative: 1.2 %
Eosinophils Absolute: 133 cells/uL (ref 15–500)
Eosinophils Relative: 2.6 %
HCT: 42.8 % (ref 35.0–45.0)
Hemoglobin: 14.3 g/dL (ref 11.7–15.5)
Lymphs Abs: 2443 cells/uL (ref 850–3900)
MCH: 28.4 pg (ref 27.0–33.0)
MCHC: 33.4 g/dL (ref 32.0–36.0)
MCV: 85.1 fL (ref 80.0–100.0)
MPV: 10 fL (ref 7.5–12.5)
Monocytes Relative: 7.1 %
Neutro Abs: 2101 cells/uL (ref 1500–7800)
Neutrophils Relative %: 41.2 %
Platelets: 272 10*3/uL (ref 140–400)
RBC: 5.03 10*6/uL (ref 3.80–5.10)
RDW: 13.5 % (ref 11.0–15.0)
Total Lymphocyte: 47.9 %
WBC: 5.1 10*3/uL (ref 3.8–10.8)

## 2021-09-13 LAB — TSH: TSH: 3.6 mIU/L (ref 0.40–4.50)

## 2021-09-13 LAB — IRON,TIBC AND FERRITIN PANEL
%SAT: 22 % (calc) (ref 16–45)
Ferritin: 82 ng/mL (ref 16–288)
Iron: 77 ug/dL (ref 45–160)
TIBC: 344 mcg/dL (calc) (ref 250–450)

## 2021-09-13 LAB — VITAMIN B12: Vitamin B-12: 450 pg/mL (ref 200–1100)

## 2021-09-13 LAB — HEMOGLOBIN A1C
Hgb A1c MFr Bld: 6.4 % of total Hgb — ABNORMAL HIGH (ref <5.7)
Mean Plasma Glucose: 137 mg/dL
eAG (mmol/L): 7.6 mmol/L

## 2021-09-13 LAB — HEPATITIS C ANTIBODY
Hepatitis C Ab: NONREACTIVE
SIGNAL TO CUT-OFF: 0.05 (ref <1.00)

## 2021-09-13 LAB — MAGNESIUM: Magnesium: 2.2 mg/dL (ref 1.5–2.5)

## 2021-09-13 LAB — MICROSCOPIC MESSAGE

## 2021-09-15 ENCOUNTER — Other Ambulatory Visit: Payer: Self-pay | Admitting: Family Medicine

## 2021-09-15 DIAGNOSIS — M79605 Pain in left leg: Secondary | ICD-10-CM

## 2021-09-15 DIAGNOSIS — M79604 Pain in right leg: Secondary | ICD-10-CM

## 2021-09-15 DIAGNOSIS — G2581 Restless legs syndrome: Secondary | ICD-10-CM

## 2021-09-15 DIAGNOSIS — R252 Cramp and spasm: Secondary | ICD-10-CM

## 2021-09-15 MED ORDER — POTASSIUM CHLORIDE CRYS ER 10 MEQ PO TBCR: 10.0000 meq | EXTENDED_RELEASE_TABLET | Freq: Every day | ORAL | 1 refills | Status: DC | PRN

## 2021-10-06 ENCOUNTER — Other Ambulatory Visit: Payer: Self-pay | Admitting: General Surgery

## 2021-10-06 DIAGNOSIS — Z1231 Encounter for screening mammogram for malignant neoplasm of breast: Secondary | ICD-10-CM

## 2021-10-27 ENCOUNTER — Encounter: Payer: Self-pay | Admitting: Family Medicine

## 2021-10-27 ENCOUNTER — Ambulatory Visit: Payer: Self-pay | Admitting: *Deleted

## 2021-10-27 ENCOUNTER — Ambulatory Visit (INDEPENDENT_AMBULATORY_CARE_PROVIDER_SITE_OTHER): Payer: BC Managed Care – PPO | Admitting: Family Medicine

## 2021-10-27 VITALS — BP 138/80 | HR 106 | Temp 98.4°F | Resp 16 | Ht 67.0 in | Wt 191.0 lb

## 2021-10-27 DIAGNOSIS — R519 Headache, unspecified: Secondary | ICD-10-CM | POA: Diagnosis not present

## 2021-10-27 DIAGNOSIS — R1110 Vomiting, unspecified: Secondary | ICD-10-CM

## 2021-10-27 DIAGNOSIS — R197 Diarrhea, unspecified: Secondary | ICD-10-CM

## 2021-10-27 DIAGNOSIS — R000 Tachycardia, unspecified: Secondary | ICD-10-CM | POA: Diagnosis not present

## 2021-10-27 DIAGNOSIS — R1032 Left lower quadrant pain: Secondary | ICD-10-CM | POA: Diagnosis not present

## 2021-10-27 MED ORDER — ONDANSETRON 4 MG PO TBDP: 4.0000 mg | ORAL_TABLET | Freq: Three times a day (TID) | ORAL | 0 refills | Status: DC | PRN

## 2021-10-27 MED ORDER — DICYCLOMINE HCL 20 MG PO TABS: 20.0000 mg | ORAL_TABLET | Freq: Three times a day (TID) | ORAL | 1 refills | Status: DC | PRN

## 2021-10-27 NOTE — Telephone Encounter (Signed)
  Chief Complaint: vomiting, diarrhea Symptoms: vomiting/nausea, fatigue, headache, diarrhea Frequency: started Monday Pertinent Negatives: Patient denies fever now Disposition: '[]'$ ED /'[]'$ Urgent Care (no appt availability in office) / '[x]'$ Appointment(In office/virtual)/ '[]'$  [facility]/ '[]'$ Home Care/ '[]'$ Refused Recommended Disposition /'[]'$ [facility]/ '[]'$  Follow-up with PCP Additional Notes: Discussed care options- patient declines ED- appointment scheduled.   Reason for Disposition  [1] Drinking very little AND [2] dehydration suspected (e.g., no urine > 12 hours, very dry mouth, very lightheaded)  Answer Assessment - Initial Assessment Questions 1. DIARRHEA SEVERITY: "How bad is the diarrhea?" "How many more stools have you had in the past 24 hours than normal?"    - NO DIARRHEA (SCALE 0)   - MILD (SCALE 1-3): Few loose or mushy BMs; increase of 1-3 stools over normal daily number of stools; mild increase in ostomy output.   -  MODERATE (SCALE 4-7): Increase of 4-6 stools daily over normal; moderate increase in ostomy output. * SEVERE (SCALE 8-10; OR 'WORST POSSIBLE'): Increase of 7 or more stools daily over normal; moderate increase in ostomy output; incontinence.     severe 2. ONSET: "When did the diarrhea begin?"      Wednesday 3. BM CONSISTENCY: "How loose or watery is the diarrhea?"      watery 4. VOMITING: "Are you also vomiting?" If Yes, ask: "How many times in the past 24 hours?"      4 times 5. ABDOMINAL PAIN: "Are you having any abdominal pain?" If Yes, ask: "What does it feel like?" (e.g., crampy, dull, intermittent, constant)      Comes and goes- before BM- cramping 6. ABDOMINAL PAIN SEVERITY: If present, ask: "How bad is the pain?"  (e.g., Scale 1-10; mild, moderate, or severe)   - MILD (1-3): doesn't interfere with normal activities, abdomen soft and not tender to touch    - MODERATE (4-7): interferes with normal activities or awakens from sleep, abdomen  tender to touch    - SEVERE (8-10): excruciating pain, doubled over, unable to do any normal activities       Mild/moderate 7. ORAL INTAKE: If vomiting, "Have you been able to drink liquids?" "How much liquids have you had in the past 24 hours?"     Trying to sip- vomits 8. HYDRATION: "Any signs of dehydration?" (e.g., dry mouth [not just dry lips], too weak to stand, dizziness, new weight loss) "When did you last urinate?"     Dry mouth,urinating normally 9. EXPOSURE: "Have you traveled to a foreign country recently?" "Have you been exposed to anyone with diarrhea?" "Could you have eaten any food that was spoiled?"     no 10. ANTIBIOTIC USE: "Are you taking antibiotics now or have you taken antibiotics in the past 2 months?"       no 11. OTHER SYMPTOMS: "Do you have any other symptoms?" (e.g., fever, blood in stool)       Fever- gone down 12. PREGNANCY: "Is there any chance you are pregnant?" "When was your last menstrual period?"  Protocols used: [facility]

## 2021-10-27 NOTE — Patient Instructions (Signed)
If you try zofran and are still not able to keep down any clear fluids today go to the ER for evaluation.  I am concerned for a kidney injury or worse infection.   Nausea and Vomiting, Adult Nausea is feeling that you have an upset stomach and that you are about to vomit. Vomiting is when food in your stomach forcefully comes out of your mouth. Vomiting can make you feel weak. If you vomit, or if you are not able to drink enough fluids, you may not have enough water in your body (get dehydrated). If you do not have enough water in your body, you may: Feel tired. Feel thirsty. Have a dry mouth. Have cracked lips. Pee (urinate) less often. Older adults and people with other diseases or a weak body defense system (immune system) are at higher risk for not having enough water in the body. If you feel like you may vomit or you vomit, it is important to follow instructions from your doctor about how to take care of yourself. Follow these instructions at home: Watch your symptoms for any changes. Tell your doctor about them. Eating and drinking     Take an ORS (oral rehydration solution). This is a drink that is sold at pharmacies and stores. Drink clear fluids in small amounts as you are able, such as: Water. Ice chips. Fruit juice that has water added (diluted fruit juice). Low-calorie sports drinks. Eat bland, easy-to-digest foods in small amounts as you are able, such as: Bananas. Applesauce. Rice. Low-fat (lean) meats. Toast. Crackers. Avoid drinking fluids that have a lot of sugar or caffeine in them. This includes energy drinks, sports drinks, and soda. Avoid alcohol. Avoid spicy or fatty foods. General instructions Take over-the-counter and prescription medicines only as told by your doctor. Drink enough fluid to keep your pee (urine) pale yellow. Wash your hands often with soap and water for at least 20 seconds. If you cannot use soap and water, use hand sanitizer. Make sure  that everyone in your home washes their hands well and often. Rest at home until you feel better. Watch your condition for any changes. Take slow and deep breaths when you feel like you may vomit. Keep all follow-up visits. Contact a doctor if: Your symptoms get worse. You have new symptoms. You have a fever. You cannot drink fluids without vomiting. You feel like you may vomit for more than 2 days. You feel light-headed or dizzy. You have a headache. You have muscle cramps. You have a rash. You have pain while peeing. Get help right away if: You have pain in your chest, neck, arm, or jaw. You feel very weak or you faint. You vomit again and again. You have vomit that is bright red or looks like black coffee grounds. You have bloody or black poop (stools) or poop that looks like tar. You have a very bad headache, a stiff neck, or both. You have very bad pain, cramping, or bloating in your belly (abdomen). You have trouble breathing. You are breathing very quickly. Your heart is beating very quickly. Your skin feels cold and clammy. You feel confused. You have signs of losing too much water in your body, such as: Dark pee, very little pee, or no pee. Cracked lips. Dry mouth. Sunken eyes. Sleepiness. Weakness. These symptoms may be an emergency. Get help right away. Call 911. Do not wait to see if the symptoms will go away. Do not drive yourself to the hospital. Summary Nausea is  feeling that you have an upset stomach and that you are about to vomit. Vomiting is when food in your stomach comes out of your mouth. Follow instructions from your doctor about eating and drinking. Take over-the-counter and prescription medicines only as told by your doctor. Contact your doctor if your symptoms get worse or you have new symptoms. Keep all follow-up visits. This information is not intended to replace advice given to you by your health care provider. Make sure you discuss any  questions you have with your health care provider. Document Revised: 12/02/2020 Document Reviewed: 12/02/2020 Elsevier Patient Education  [place].

## 2021-10-27 NOTE — Progress Notes (Signed)
Patient ID: Shirley Blackwell, female    DOB: 04-26-1956, 66 y.o.   MRN: [id number]  PCP: Delsa Grana, PA-C  Chief Complaint  Patient presents with   Emesis    X3 days, can't keep anything down including water   Diarrhea    Subjective:   Shirley Blackwell is a 66 y.o. female, presents to clinic with CC of the following:  HPI  Onset chills, N, V, D 3+ days ago, fever with Tmax 101.3 Not tolerating POs at all, small sips she vomits, currently having watery bm 4-5 x a day proceeded by lower abd cramping.  She denies blood or mucus in stools, vomit is "clear" denies hematemesis.  She is s/p cholecystectomy and right  colectomy with appendectomy.  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 12/07/2017   Overweight (BMI 25.0-29.9) 11/18/2017   Carcinoma of upper-outer quadrant of right breast in female, estrogen receptor positive ([place]) 12/23/2015   Prediabetes 12/06/2014   Dumping syndrome 12/06/2014   Mixed hyperlipidemia 12/06/2014   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 , Disp: , Rfl:    Misc Natural Products (GLUCOSAMINE CHOND COMPLEX/MSM PO), Take 2 tablets by mouth daily., Disp: , Rfl:    potassium chloride (KLOR-CON M) 10 MEQ tablet, Take 1  tablet (10 mEq total) by mouth daily as needed (cramps and muscle spasms). Use only 1-3 days at a time and stop use if not improvement in symptoms, Disp: 10 tablet, Rfl: 1   Allergies  Allergen Reactions   Macrobid [Nitrofurantoin Macrocrystal]    Sulfa Antibiotics Hives    Other reaction(s): Hives   Tape Other (See Comments)    blisters     Social History   Tobacco Use   Smoking status: Never   Smokeless tobacco: Never  Vaping Use   Vaping Use: Never used  Substance Use Topics   Alcohol use: No   Drug use: No      Chart Review Today: I personally reviewed active problem list, medication list, allergies, family history, social history, health maintenance, notes from last encounter, lab results, imaging with the patient/caregiver today.   Review of Systems  Constitutional: Negative.   HENT: Negative.    Eyes: Negative.   Respiratory: Negative.    Cardiovascular: Negative.   Gastrointestinal: Negative.   Endocrine: Negative.   Genitourinary: Negative.   Musculoskeletal: Negative.   Skin: Negative.   Allergic/Immunologic: Negative.   Neurological: Negative.   Hematological: Negative.   Psychiatric/Behavioral: Negative.    All other systems reviewed and are negative.     Objective:   Vitals:   10/27/21 1046  BP: 138/80  Pulse: (!) 106  Resp: 16  Temp: 98.4  F (36.9 C)  TempSrc: Oral  SpO2: 96%  Weight: 191 lb (86.6 kg)  Height: 5' 7"  (1.702 m)    Body mass index is 29.91 kg/m.  Physical Exam Vitals and nursing note reviewed.  Constitutional:      General: She is not in acute distress.    Appearance: Normal appearance. She is well-developed and well-groomed. She is obese. She is not toxic-appearing or diaphoretic.     Comments: Patient appears uncomfortable and mildly ill  HENT:     Head: Normocephalic and atraumatic.     Nose: Nose normal.     Mouth/Throat:     Mouth: Mucous membranes are dry.     Pharynx: Oropharynx is clear. No oropharyngeal  exudate or posterior oropharyngeal erythema.  Eyes:     General:        Right eye: No discharge.        Left eye: No discharge.     Conjunctiva/sclera: Conjunctivae normal.  Neck:     Trachea: Trachea and phonation normal. No tracheal deviation.     Meningeal: Brudzinski's sign and Kernig's sign absent.  Cardiovascular:     Rate and Rhythm: Regular rhythm. Tachycardia present.     Pulses: Normal pulses.     Heart sounds: Normal heart sounds.  Pulmonary:     Effort: Pulmonary effort is normal. No respiratory distress.     Breath sounds: Normal breath sounds. No stridor.  Abdominal:     General: Bowel sounds are normal. There is no distension.     Palpations: Abdomen is soft. There is no mass.     Tenderness: There is abdominal tenderness in the left lower quadrant. There is no right CVA tenderness, left CVA tenderness, guarding or rebound.  Musculoskeletal:     Cervical back: Normal range of motion. No rigidity. Normal range of motion.  Lymphadenopathy:     Cervical: No cervical adenopathy.  Skin:    General: Skin is warm and dry.     Capillary Refill: Capillary refill takes less than 2 seconds.     Coloration: Skin is not jaundiced or pale.     Findings: No bruising, erythema or rash.  Neurological:     Mental Status: She is alert.     Motor: No abnormal muscle tone.     Coordination: Coordination normal.     Gait: Gait normal.  Psychiatric:        Mood and Affect: Mood normal.        Behavior: Behavior normal. Behavior is cooperative.     Results for orders placed or performed in visit on 09/12/21  MICROSCOPIC MESSAGE  Result Value Ref Range   Note    COMPLETE METABOLIC PANEL WITH GFR  Result Value Ref Range   Glucose, Bld 112 (H) 65 - 99 mg/dL   BUN 13 7 - 25 mg/dL   Creat 0.70 0.50 - 1.05 mg/dL   eGFR 97 > OR = 60 mL/min/1.37m   BUN/Creatinine Ratio NOT APPLICABLE 6 - 22 (calc)   Sodium 141 135 - 146 mmol/L   Potassium 3.6 3.5 - 5.3 mmol/L   Chloride 104 98 -  110 mmol/L   CO2 27 20 - 32 mmol/L   Calcium 9.5 8.6 - 10.4 mg/dL   Total Protein 7.9 6.1 - 8.1 g/dL   Albumin 4.4 3.6 - 5.1 g/dL   Globulin 3.5 1.9 - 3.7 g/dL (calc)   AG Ratio 1.3 1.0 - 2.5 (calc)   Total Bilirubin 0.5 0.2 - 1.2 mg/dL  Alkaline phosphatase (APISO) 96 37 - 153 U/L   AST 23 10 - 35 U/L   ALT 25 6 - 29 U/L  Lipid panel  Result Value Ref Range   Cholesterol 241 (H) <200 mg/dL   HDL 56 > OR = 50 mg/dL   Triglycerides 158 (H) <150 mg/dL   LDL Cholesterol (Calc) 156 (H) mg/dL (calc)   Total CHOL/HDL Ratio 4.3 <5.0 (calc)   Non-HDL Cholesterol (Calc) 185 (H) <130 mg/dL (calc)  Hemoglobin A1c  Result Value Ref Range   Hgb A1c MFr Bld 6.4 (H) <5.7 % of total Hgb   Mean Plasma Glucose 137 mg/dL   eAG (mmol/L) 7.6 mmol/L  Hepatitis C antibody  Result Value Ref Range   Hepatitis C Ab NON-REACTIVE NON-REACTIVE   SIGNAL TO CUT-OFF 0.05 <1.00  Urine Microalbumin w/creat. ratio  Result Value Ref Range   Creatinine, Urine 181 20 - 275 mg/dL   Microalb, Ur 33.4 mg/dL   Microalb Creat Ratio 185 (H) <30 mcg/mg creat  Urinalysis, Routine w reflex microscopic  Result Value Ref Range   Color, Urine YELLOW YELLOW   APPearance CLEAR CLEAR   Specific Gravity, Urine 1.022 1.001 - 1.035   pH 5.5 5.0 - 8.0   Glucose, UA NEGATIVE NEGATIVE   Bilirubin Urine NEGATIVE NEGATIVE   Ketones, ur NEGATIVE NEGATIVE   Hgb urine dipstick NEGATIVE NEGATIVE   Protein, ur 2+ (A) NEGATIVE   Nitrite NEGATIVE NEGATIVE   Leukocytes,Ua TRACE (A) NEGATIVE   WBC, UA 0-5 0 - 5 /HPF   RBC / HPF 0-2 0 - 2 /HPF   Squamous Epithelial / LPF 0-5 < OR = 5 /HPF   Bacteria, UA NONE SEEN NONE SEEN /HPF   Calcium Oxalate Crystal FEW NONE OR FEW /HPF   Hyaline Cast NONE SEEN NONE SEEN /LPF  CBC with Differential/Platelet  Result Value Ref Range   WBC 5.1 3.8 - 10.8 Thousand/uL   RBC 5.03 3.80 - 5.10 Million/uL   Hemoglobin 14.3 11.7 - 15.5 g/dL   HCT 42.8 35.0 - 45.0 %   MCV 85.1 80.0 - 100.0 fL   MCH  28.4 27.0 - 33.0 pg   MCHC 33.4 32.0 - 36.0 g/dL   RDW 13.5 11.0 - 15.0 %   Platelets 272 140 - 400 Thousand/uL   MPV 10.0 7.5 - 12.5 fL   Neutro Abs 2,101 1,500 - 7,800 cells/uL   Lymphs Abs 2,443 850 - 3,900 cells/uL   Absolute Monocytes 362 200 - 950 cells/uL   Eosinophils Absolute 133 15 - 500 cells/uL   Basophils Absolute 61 0 - 200 cells/uL   Neutrophils Relative % 41.2 %   Total Lymphocyte 47.9 %   Monocytes Relative 7.1 %   Eosinophils Relative 2.6 %   Basophils Relative 1.2 %  TSH  Result Value Ref Range   TSH 3.60 0.40 - 4.50 mIU/L  Vitamin B12  Result Value Ref Range   Vitamin B-12 450 200 - 1,100 pg/mL  Iron, TIBC and Ferritin Panel  Result Value Ref Range   Iron 77 45 - 160 mcg/dL   TIBC 344 250 - 450 mcg/dL (calc)   %SAT 22 16 - 45 % (calc)   Ferritin 82 16 - 288 ng/mL  Magnesium  Result Value Ref Range   Magnesium 2.2 1.5 - 2.5 mg/dL       Assessment & Plan:     ICD-10-CM   1. Vomiting and diarrhea  R11.10 ondansetron (ZOFRAN-ODT) 4 MG disintegrating tablet  R19.7 dicyclomine (BENTYL) 20 MG tablet   Nausea and vomiting x4 days, not tolerating PO, signs of dehydration, if meds do not work she was instructed to go to the ER for IVF/meds/eval    2. Left lower quadrant abdominal pain  R10.32 ondansetron (ZOFRAN-ODT) 4 MG disintegrating tablet    dicyclomine (BENTYL) 20 MG tablet   Mildly tender without guarding to left lower quadrant, suspect gastroenteritis but may also be colitis or diverticulitis    3. Acute nonintractable headache, unspecified headache type  R51.9    Suspect due to acute illness and dehydration, no meningeal signs    4. Tachycardia  R00.0    Likely secondary to pain and dehydration, other vital signs stable     Do suspect the patient has a viral gastroenteritis -I explained to her thoroughly that I am concerned that she has not been able to keep down any liquids explained to her that this can cause kidney injury or worsening  infection.  If she does not have some improvement in p.o. tolerance with Zofran then she needs to go to the ER.  I am concerned with her headache and mild tachycardia that she could become septic or confused or have a much worse infection if she waits any longer.  At this time I do not suspect acute abdomen -and her other vital signs are stable -she has adamantly denied and refused to go to the ER previously and she still would like to try prescription medication before going to the ER today. She had no guarding or rebound tenderness.  I would feel uncomfortable doing labs today and getting results tomorrow knowing that she would probably have abnormal results and feel more comfortable with her going to the ER and getting labs there if she needs to.  She has not had any blood in her stool or vomit, she has no gallbladder or appendix, she had fever that resolved yesterday and has not been febrile today.    ER precautions were reviewed at length and patient verbalized understanding  Delsa Grana, PA-C 10/27/21 10:48 AM

## 2021-10-31 ENCOUNTER — Ambulatory Visit: Payer: Self-pay

## 2021-10-31 NOTE — Telephone Encounter (Signed)
Summary: medication not working   Pt called in because she was recently seen about diarrhea and vomiting, and was giving some medication but states that is not working, and wanted to speak with a nurse, pt states she was physically at the urgent care, but it was a long wait, please advise.                   Left message to call back about symptoms.

## 2021-10-31 NOTE — Telephone Encounter (Signed)
Second attempt to reach pt. Left message to call back about symptoms. 

## 2021-10-31 NOTE — Telephone Encounter (Signed)
3rd attempt to contact pt without success.  Per policy I am forwarding this to [facility] since unable to contact her after 3 attempts.

## 2021-11-13 ENCOUNTER — Ambulatory Visit
Admission: RE | Admit: 2021-11-13 | Discharge: 2021-11-13 | Disposition: A | Discharge status: Home / Self Care | Payer: BC Managed Care – PPO | Source: Ambulatory Visit | Attending: General Surgery | Admitting: General Surgery

## 2021-11-13 DIAGNOSIS — Z1231 Encounter for screening mammogram for malignant neoplasm of breast: Secondary | ICD-10-CM | POA: Insufficient documentation

## 2022-01-12 ENCOUNTER — Ambulatory Visit (INDEPENDENT_AMBULATORY_CARE_PROVIDER_SITE_OTHER): Payer: BC Managed Care – PPO | Admitting: Nurse Practitioner

## 2022-01-12 ENCOUNTER — Encounter: Payer: Self-pay | Admitting: Nurse Practitioner

## 2022-01-12 VITALS — BP 140/86 | HR 98 | Temp 98.0°F | Resp 16 | Ht 67.0 in | Wt 192.5 lb

## 2022-01-12 DIAGNOSIS — R300 Dysuria: Secondary | ICD-10-CM | POA: Diagnosis not present

## 2022-01-12 LAB — POCT URINALYSIS DIPSTICK
Bilirubin, UA: NEGATIVE
Glucose, UA: NEGATIVE
Ketones, UA: NEGATIVE
Nitrite, UA: POSITIVE
Odor: NORMAL
Protein, UA: NEGATIVE
Spec Grav, UA: 1.025 (ref 1.010–1.025)
Urobilinogen, UA: 0.2 E.U./dL
pH, UA: 5 (ref 5.0–8.0)

## 2022-01-12 MED ORDER — CEPHALEXIN 500 MG PO CAPS: 500.0000 mg | ORAL_CAPSULE | Freq: Two times a day (BID) | ORAL | 0 refills | Status: DC

## 2022-01-12 NOTE — Addendum Note (Signed)
Addended by: Serafina Royals F on: 01/12/2022 11:33 AM   Modules accepted: Orders

## 2022-01-12 NOTE — Progress Notes (Addendum)
BP (!) 140/86   Pulse 98   Temp 98 F (36.7 C) (Oral)   Resp 16   Ht '5\' 7"'$  (1.702 m)   Wt 192 lb 8 oz (87.3 kg)   SpO2 97%   BMI 30.15 kg/m    Subjective:    Patient ID: Shirley Blackwell, female    DOB: 1956/03/06, 66 y.o.   MRN: [id number]  HPI: Shirley Blackwell is a 66 y.o. female  Chief Complaint  Patient presents with   Dysuria    Pt was recently seen at [facility] was given cephalexin  until this day no improvement   Dysuria: Patient states that last week she was seen at urgent care for a uti. She was started on keflex and was getting better. She says she finished the prescription on Tuesday and the symptoms returned. Patient reports dysuria and urinary frequency. She denies any fever or back pain.  Patient states she frequently gets urinary tract infections.  She states she normally has to take the antibiotic for 7 to 10 days to clear it up.  Patient states she feels like she did not get enough days of the Keflex.  We will send in Keflex for 7 days.  And send urine for culture.  Her urine dip showed positive nitrates and large leukocytes.  Discussed with patient increasing fluid intake and taking cranberry tablets.  Patient states she is already doing that.  Patient states she is also taking Pyridium for the pain.  Relevant past medical, surgical, family and social history reviewed and updated as indicated. Interim medical history since our last visit reviewed. Allergies and medications reviewed and updated.  Review of Systems  Constitutional: Negative for fever or weight change.  Respiratory: Negative for cough and shortness of breath.   Cardiovascular: Negative for chest pain or palpitations.  Gastrointestinal: Negative for abdominal pain, no bowel changes.  GU: For urinary frequency and dysuria Musculoskeletal: Negative for gait problem or joint swelling.  Skin: Negative for rash.  Neurological: Negative for dizziness or headache.  No other specific complaints in a complete review of  systems (except as listed in HPI above).      Objective:    BP (!) 140/86   Pulse 98   Temp 98 F (36.7 C) (Oral)   Resp 16   Ht '5\' 7"'$  (1.702 m)   Wt 192 lb 8 oz (87.3 kg)   SpO2 97%   BMI 30.15 kg/m   Wt Readings from Last 3 Encounters:  01/12/22 192 lb 8 oz (87.3 kg)  10/27/21 191 lb (86.6 kg)  09/12/21 196 lb 14.4 oz (89.3 kg)    Physical Exam  Constitutional: Patient appears well-developed and well-nourished. Obese  No distress.  HEENT: head atraumatic, normocephalic, pupils equal and reactive to light, neck supple Cardiovascular: Normal rate, regular rhythm and normal heart sounds.  No murmur heard. No BLE edema. Pulmonary/Chest: Effort normal and breath sounds normal. No respiratory distress. Abdominal: Soft.  There is no tenderness.No CVA tenderness Psychiatric: Patient has a normal mood and affect. behavior is normal. Judgment and thought content normal.  Results for orders placed or performed in visit on 01/12/22  POCT Urinalysis Dipstick  Result Value Ref Range   Color, UA Clear    Clarity, UA Clear    Glucose, UA Negative Negative   Bilirubin, UA Negative    Ketones, UA Negative    Spec Grav, UA 1.025 1.010 - 1.025   Blood, UA Large    pH, UA  5.0 5.0 - 8.0   Protein, UA Negative Negative   Urobilinogen, UA 0.2 0.2 or 1.0 E.U./dL   Nitrite, UA Positive    Leukocytes, UA Large (3+) (A) Negative   Appearance Clear    Odor Normal       Assessment & Plan:   Problem List Items Addressed This Visit   None Visit Diagnoses     Dysuria    -  Primary   Sending urine for culture.  Start patient on Keflex 500 mg twice daily for 7 days.  Continue taking cranberry tablets and Pyridium.   Relevant Medications   cephALEXin (KEFLEX) 500 MG capsule   Other Relevant Orders   POCT Urinalysis Dipstick (Completed)   Urine Culture        Follow up plan: Return if symptoms worsen or fail to improve.

## 2022-01-13 LAB — URINE CULTURE
MICRO NUMBER:: 13737296
SPECIMEN QUALITY:: ADEQUATE

## 2022-01-15 ENCOUNTER — Other Ambulatory Visit: Payer: Self-pay | Admitting: Nurse Practitioner

## 2022-01-15 DIAGNOSIS — R300 Dysuria: Secondary | ICD-10-CM

## 2022-01-15 MED ORDER — CEPHALEXIN 500 MG PO CAPS: 500.0000 mg | ORAL_CAPSULE | Freq: Two times a day (BID) | ORAL | 0 refills | Status: AC

## 2022-02-26 ENCOUNTER — Telehealth (INDEPENDENT_AMBULATORY_CARE_PROVIDER_SITE_OTHER): Payer: BC Managed Care – PPO | Admitting: Family Medicine

## 2022-02-26 DIAGNOSIS — G2581 Restless legs syndrome: Secondary | ICD-10-CM | POA: Diagnosis not present

## 2022-02-26 DIAGNOSIS — R252 Cramp and spasm: Secondary | ICD-10-CM | POA: Diagnosis not present

## 2022-02-26 DIAGNOSIS — I10 Essential (primary) hypertension: Secondary | ICD-10-CM | POA: Diagnosis not present

## 2022-02-26 DIAGNOSIS — E782 Mixed hyperlipidemia: Secondary | ICD-10-CM | POA: Diagnosis not present

## 2022-02-26 DIAGNOSIS — Z760 Encounter for issue of repeat prescription: Secondary | ICD-10-CM

## 2022-02-26 MED ORDER — AMLODIPINE BESY-BENAZEPRIL HCL 5-10 MG PO CAPS: 1.0000 | ORAL_CAPSULE | Freq: Every day | ORAL | 3 refills | Status: DC

## 2022-02-26 MED ORDER — POTASSIUM CHLORIDE CRYS ER 10 MEQ PO TBCR: 10.0000 meq | EXTENDED_RELEASE_TABLET | Freq: Every day | ORAL | 1 refills | Status: DC | PRN

## 2022-02-26 MED ORDER — EZETIMIBE 10 MG PO TABS: 10.0000 mg | ORAL_TABLET | Freq: Every day | ORAL | 3 refills | Status: DC

## 2022-02-26 NOTE — Progress Notes (Signed)
Name: Shirley Blackwell   MRN: [id number]    DOB: 1956/02/15   Date:02/26/2022       Progress Note  Subjective:    I connected with  [name]  on 02/26/22 at 11:20 AM EDT by a video enabled telemedicine application and verified that I am speaking with the correct person using two identifiers.  I discussed the limitations of evaluation and management by telemedicine and the availability of in person appointments. The patient expressed understanding and agreed to proceed. Staff also discussed with the patient that there may be a patient responsible charge related to this service. Patient Location: daughters house - private room Provider Location: [facility] office  Additional Individuals present: none  Chief Complaint  Patient presents with   Follow-up    No vital signs taken today   Hypertension   Hyperlipidemia    Shirley Blackwell is a 66 y.o. female, presents for virtual visit for routine follow up on the conditions listed above.  Hypertension:  Currently managed on amLODipine-benazepril - no recent changes Pt reports good med compliance and denies any SE.   Blood pressure today not checked - but monitoring over time pt BP ranges 110's/70's BP Readings from Last 3 Encounters:  01/12/22 (!) 140/86  10/27/21 138/80  09/12/21 112/70   Pt denies CP, SOB, exertional sx, LE edema, palpitation, Ha's, visual disturbances, lightheadedness, hypotension, syncope. Dietary efforts for BP? 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   Overweight (BMI 25.0-29.9) 11/18/2017   Carcinoma of upper-outer quadrant of right breast in female, estrogen receptor positive ([place]) 12/23/2015   Prediabetes 12/06/2014   Dumping syndrome 12/06/2014   Mixed hyperlipidemia 12/06/2014   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 , Disp: , Rfl:    Misc Natural Products (GLUCOSAMINE CHOND COMPLEX/MSM PO), Take 2 tablets by mouth daily., Disp: , Rfl:    potassium chloride (KLOR-CON M) 10 MEQ tablet, Take 1 tablet (10 mEq total) by mouth daily as needed (cramps and muscle spasms). Use only 1-3 days at a time and stop use if not improvement in symptoms, Disp: 10 tablet, Rfl: 1   dicyclomine (BENTYL) 20 MG tablet, Take 1 tablet (20 mg total) by mouth 3 (three) times daily as needed for spasms (abd cramping). (Patient not taking: Reported on 01/12/2022), Disp: 20 tablet, Rfl: 1   ondansetron (ZOFRAN-ODT) 4 MG disintegrating tablet, Take 1-2 tablets (4-8 mg total) by mouth every 8 (eight) hours as needed  for nausea or vomiting. 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  0/10 lymph nodes.   COLONOSCOPY  2013, 2014   Dr. Jamal Collin   KNEE SURGERY Left 2014   MASTECTOMY Right 2012   invasive lobular CA,   Family History  Problem Relation Age of Onset   Diabetes Mother    Hypertension Mother    Kidney disease Mother    Hypertension Father    Prostate cancer Father    Cancer Sister        Theadora Rama   Diabetes Maternal Grandmother    Heart failure Maternal Grandmother    Heart failure Paternal Grandfather    Breast cancer Neg Hx    Social History   Socioeconomic History   Marital status: Married    Spouse name: Not on file   Number of children: Not on file   Years of education: Not on file   Highest education level: Not on file  Occupational History   Not on file  Tobacco Use   Smoking status: Never   Smokeless tobacco: Never  Vaping Use   Vaping Use: Never used  Substance and Sexual Activity   Alcohol use: No   Drug use: No   Sexual activity: Yes  Other Topics Concern   Not on file  Social History Narrative   Not on file   Social Determinants of Health   Financial Resource Strain: Low Risk  (01/26/2021)   Overall Financial Resource Strain (CARDIA)    Difficulty of Paying Living Expenses: Not hard at all  Food Insecurity: No Food Insecurity (01/26/2021)   Hunger Vital Sign    Worried About Running Out of Food in the Last Year: Never true    [place] in the Last Year: Never true  Transportation Needs: No Transportation Needs (01/26/2021)   PRAPARE - Hydrologist (Medical): No    Lack of Transportation (Non-Medical): No  Physical Activity: Insufficiently Active (01/26/2021)   Exercise Vital Sign    Days of Exercise per Week: 3 days    Minutes of Exercise per Session: 20 min  Stress: No Stress Concern Present (01/26/2021)   Champion    Feeling of Stress : Not at all  Social Connections: [place] (01/26/2021)   Social Connection and Isolation Panel [NHANES]    Frequency of Communication with Friends and Family: More than three times a week    Frequency of Social Gatherings with Friends and Family: Twice a week    Attends Religious Services: 1 to 4 times per year    Active Member of [employer] or Organizations: Yes    Attends Archivist Meetings: More than 4 times per year    Marital Status: Married  Human resources officer Violence: Not At Risk (01/26/2021)   Humiliation, Afraid, Rape, and Kick questionnaire  Fear of Current or Ex-Partner: No    Emotionally Abused: No    Physically Abused: No    Sexually Abused: No    Chart Review Today: I personally reviewed active problem list, medication list, allergies, family history, social history, health maintenance, notes from last encounter, lab results, imaging with the patient/caregiver today.   Review of Systems  Constitutional: Negative.   HENT: Negative.    Eyes: Negative.   Respiratory: Negative.    Cardiovascular: Negative.   Gastrointestinal: Negative.   Endocrine: Negative.   Genitourinary: Negative.   Musculoskeletal: Negative.   Skin: Negative.   Allergic/Immunologic: Negative.   Neurological: Negative.   Hematological: Negative.   Psychiatric/Behavioral: Negative.    All other systems reviewed and are negative.     Objective:    Virtual encounter, vitals limited, only able to obtain the following There were no vitals filed for this visit. There is no height or weight on file to calculate BMI. Nursing Note and Vital Signs reviewed.  Physical Exam Vitals and nursing note reviewed.  Constitutional:      Appearance: Normal appearance. She is obese.  Pulmonary:     Effort: No respiratory distress.  Neurological:     Mental Status: She is alert.  Psychiatric:         Mood and Affect: Mood normal.     PE limited by telephone encounter  No results found for this or any previous visit (from the past 72 hour(s)).  PHQ2/9:    02/26/2022   11:05 AM 01/12/2022   10:55 AM 10/27/2021   10:45 AM 09/12/2021   10:53 AM 01/26/2021    1:23 PM  Depression screen PHQ 2/9  Decreased Interest 0 0 0 0 0  Down, Depressed, Hopeless 0 0 0 0 0  PHQ - 2 Score 0 0 0 0 0  Altered sleeping 0 0  0   Tired, decreased energy 0 0  0   Change in appetite 0 0  0   Feeling bad or failure about yourself  0 0  0   Trouble concentrating 0 0  0   Moving slowly or fidgety/restless 0 0  0   Suicidal thoughts 0 0  0   PHQ-9 Score 0 0  0   Difficult doing work/chores Not difficult at all Not difficult at all  Not difficult at all    PHQ-2/9 Result is reviewed and negative today  Fall Risk:    02/26/2022   11:05 AM 01/12/2022   10:55 AM 10/27/2021   10:45 AM 09/12/2021   10:52 AM 01/26/2021    1:23 PM  Fall Risk   Falls in the past year? 0 0 0 1 0  Number falls in past yr: 0 0 0 0 0  Injury with Fall? 0 0 0 0 0  Risk for fall due to : No Fall Risks No Fall Risks No Fall Risks Impaired balance/gait   Follow up Falls prevention discussed;Education provided Falls prevention discussed;Education provided Falls prevention discussed Falls prevention discussed Falls evaluation completed     Assessment and Plan:     ICD-10-CM   1. Benign essential HTN  I10 amLODipine-benazepril (LOTREL) 5-10 MG capsule   BP reported to be well controlled on same home meds, refills sent in, recheck in office in 3-4 months    2. Muscle cramps  R25.2 potassium chloride (KLOR-CON M) 10 MEQ tablet   potassium Rx few times a month helps with managing sx, refills given    3.  Restless legs  G25.81 potassium chloride (KLOR-CON M) 10 MEQ tablet   potassium Rx few times a month helps with managing sx    4. Mixed hyperlipidemia  E78.2 ezetimibe (ZETIA) 10 MG tablet   willing to try zetia after checking  that her mom is on it and tolerates it - repeat labs in 3-4 months, she is doing diet/lifestyle efforts with health coach    5. Medication refill  Z76.0       I discussed the assessment and treatment plan with the patient. The patient was provided an opportunity to ask questions and all were answered. The patient agreed with the plan and demonstrated an understanding of the instructions.  The patient was advised to call back or seek an in-person evaluation if the symptoms worsen or if the condition fails to improve as anticipated.  I provided 17+ minutes of non-face-to-face time during this encounter.  Delsa Grana, PA-C 02/26/22 11:41 AM

## 2022-06-01 ENCOUNTER — Telehealth: Payer: Self-pay | Admitting: Family Medicine

## 2022-06-01 NOTE — Telephone Encounter (Signed)
Pt would like to get her labs done (06/13/2022) prior to her OV 06/20/22.

## 2022-06-01 NOTE — Telephone Encounter (Signed)
Copied from [place] [id number]. Topic: General - Other >> Jun 01, 2022  8:40 AM Shirley Blackwell wrote: Pt states provider wants to recheck her cholesterol level  Please fu w/ pt once order has been submitted

## 2022-06-05 ENCOUNTER — Other Ambulatory Visit: Payer: Self-pay | Admitting: Family Medicine

## 2022-06-05 DIAGNOSIS — R7303 Prediabetes: Secondary | ICD-10-CM

## 2022-06-05 DIAGNOSIS — E782 Mixed hyperlipidemia: Secondary | ICD-10-CM

## 2022-06-05 DIAGNOSIS — Z5181 Encounter for therapeutic drug level monitoring: Secondary | ICD-10-CM

## 2022-06-05 NOTE — Telephone Encounter (Signed)
Patient aware.

## 2022-06-20 ENCOUNTER — Ambulatory Visit (INDEPENDENT_AMBULATORY_CARE_PROVIDER_SITE_OTHER): Payer: BC Managed Care – PPO | Admitting: Nurse Practitioner

## 2022-06-20 ENCOUNTER — Other Ambulatory Visit: Payer: Self-pay

## 2022-06-20 ENCOUNTER — Encounter: Payer: Self-pay | Admitting: Nurse Practitioner

## 2022-06-20 VITALS — BP 120/86 | HR 96 | Temp 98.2°F | Resp 16 | Ht 67.0 in | Wt 190.6 lb

## 2022-06-20 DIAGNOSIS — R7303 Prediabetes: Secondary | ICD-10-CM | POA: Diagnosis not present

## 2022-06-20 DIAGNOSIS — I10 Essential (primary) hypertension: Secondary | ICD-10-CM

## 2022-06-20 DIAGNOSIS — R252 Cramp and spasm: Secondary | ICD-10-CM | POA: Diagnosis not present

## 2022-06-20 DIAGNOSIS — G2581 Restless legs syndrome: Secondary | ICD-10-CM | POA: Insufficient documentation

## 2022-06-20 DIAGNOSIS — E782 Mixed hyperlipidemia: Secondary | ICD-10-CM

## 2022-06-20 NOTE — Assessment & Plan Note (Signed)
Getting labs

## 2022-06-20 NOTE — Assessment & Plan Note (Signed)
Takes potassium supplement as needed for cramps

## 2022-06-20 NOTE — Assessment & Plan Note (Signed)
Continue taking amlodipine-benazepril 5-10 mg daily.

## 2022-06-20 NOTE — Assessment & Plan Note (Signed)
Continue taking zetia 10 mg daily.

## 2022-06-20 NOTE — Progress Notes (Signed)
BP 120/86   Pulse 96   Temp 98.2 F (36.8 C) (Oral)   Resp 16   Ht '5\' 7"'$  (1.702 m)   Wt 190 lb 9.6 oz (86.5 kg)   SpO2 96%   BMI 29.85 kg/m    Subjective:    Patient ID: Shirley Blackwell, female    DOB: Oct 25, 1955, 67 y.o.   MRN: [id number]  HPI: Shirley Blackwell is a 67 y.o. female  Chief Complaint  Patient presents with   Hypertension    6 month follow up   Hyperlipidemia   HTN:her blood pressure today is 120/86.  She is currently taking amlodipine-benazepril 5-10 mg daily.  She denies any chest pain, shortness of breath, headaches or blurred vision.   Hyperlipidemia:her last LDL was 156 on 09/12/2021.she was prescribed zetia and she says she has been taking daily. She says she did have some muscle aches but her potassium tablets have helped. Will get labs.   The 10-year ASCVD risk score (Arnett DK, et al., 2019) is: 6.5%   Values used to calculate the score:     Age: 75 years     Sex: Female     Is Non-Hispanic African American: No     Diabetic: No     Tobacco smoker: No     Systolic Blood Pressure: 517 mmHg     Is BP treated: Yes     HDL Cholesterol: 56 mg/dL     Total Cholesterol: 241 mg/dL   Leg cramps/restless legs:she currently takes potassium supplement as needed for leg cramps.    Prediabetes: her last A1C was 5.9 on 01/05/2021.  She denies any polyuria, polydipsia or polyphagia.  Will get labs.   Relevant past medical, surgical, family and social history reviewed and updated as indicated. Interim medical history since our last visit reviewed. Allergies and medications reviewed and updated.  Review of Systems Constitutional: Negative for fever or weight change.  Respiratory: Negative for cough and shortness of breath.   Cardiovascular: Negative for chest pain or palpitations.  Gastrointestinal: Negative for abdominal pain, no bowel changes.  Musculoskeletal: Negative for gait problem or joint swelling.  Skin: Negative for rash.  Neurological: Negative for  dizziness or headache.  No other specific complaints in a complete review of systems (except as listed in HPI above).      Objective:    BP 120/86   Pulse 96   Temp 98.2 F (36.8 C) (Oral)   Resp 16   Ht '5\' 7"'$  (1.702 m)   Wt 190 lb 9.6 oz (86.5 kg)   SpO2 96%   BMI 29.85 kg/m   Wt Readings from Last 3 Encounters:  06/20/22 190 lb 9.6 oz (86.5 kg)  01/12/22 192 lb 8 oz (87.3 kg)  10/27/21 191 lb (86.6 kg)    Physical Exam  Constitutional: Patient appears well-developed and well-nourished.  No distress.  HEENT: head atraumatic, normocephalic, pupils equal and reactive to light, neck supple Cardiovascular: Normal rate, regular rhythm and normal heart sounds.  No murmur heard. No BLE edema. Pulmonary/Chest: Effort normal and breath sounds normal. No respiratory distress. Abdominal: Soft.  There is no tenderness. Psychiatric: Patient has a normal mood and affect. behavior is normal. Judgment and thought content normal.    Assessment & Plan:   Problem List Items Addressed This Visit       Cardiovascular and Mediastinum   Benign essential HTN    Continue taking amlodipine-benazepril 5-10 mg daily.      Relevant  Orders   CBC with Differential/Platelet   COMPLETE METABOLIC PANEL WITH GFR     Other   Prediabetes    Getting labs      Relevant Orders   Hemoglobin A1c   Mixed hyperlipidemia - Primary    Continue taking zetia 10 mg daily.       Relevant Orders   Lipid panel   COMPLETE METABOLIC PANEL WITH GFR   Muscle cramps    Takes potassium supplement as needed for cramps      Relevant Orders   CBC with Differential/Platelet   COMPLETE METABOLIC PANEL WITH GFR   Restless legs    Takes potassium supplement as needed for cramps      Relevant Orders   CBC with Differential/Platelet   COMPLETE METABOLIC PANEL WITH GFR     Follow up plan: Return in about 6 months (around 12/19/2022) for follow up with leisa.

## 2022-06-21 LAB — LIPID PANEL
Cholesterol: 217 mg/dL — ABNORMAL HIGH (ref <200)
HDL: 58 mg/dL (ref >=50)
LDL Cholesterol (Calc): 138 mg/dL (calc) — ABNORMAL HIGH
Non-HDL Cholesterol (Calc): 159 mg/dL (calc) — ABNORMAL HIGH (ref <130)
Total CHOL/HDL Ratio: 3.7 (calc) (ref <5.0)
Triglycerides: 106 mg/dL (ref <150)

## 2022-06-21 LAB — COMPLETE METABOLIC PANEL WITH GFR
AG Ratio: 1.2 (calc) (ref 1.0–2.5)
ALT: 14 U/L (ref 6–29)
AST: 15 U/L (ref 10–35)
Albumin: 4.2 g/dL (ref 3.6–5.1)
Alkaline phosphatase (APISO): 90 U/L (ref 37–153)
BUN: 14 mg/dL (ref 7–25)
CO2: 24 mmol/L (ref 20–32)
Calcium: 9.4 mg/dL (ref 8.6–10.4)
Chloride: 106 mmol/L (ref 98–110)
Creat: 0.71 mg/dL (ref 0.50–1.05)
Globulin: 3.5 g/dL (calc) (ref 1.9–3.7)
Glucose, Bld: 112 mg/dL — ABNORMAL HIGH (ref 65–99)
Potassium: 4.3 mmol/L (ref 3.5–5.3)
Sodium: 141 mmol/L (ref 135–146)
Total Bilirubin: 0.5 mg/dL (ref 0.2–1.2)
Total Protein: 7.7 g/dL (ref 6.1–8.1)
eGFR: 95 mL/min/{1.73_m2} (ref >=60)

## 2022-06-21 LAB — CBC WITH DIFFERENTIAL/PLATELET
Absolute Monocytes: 389 cells/uL (ref 200–950)
Basophils Absolute: 40 cells/uL (ref 0–200)
Basophils Relative: 0.6 %
Eosinophils Absolute: 168 cells/uL (ref 15–500)
Eosinophils Relative: 2.5 %
HCT: 44.4 % (ref 35.0–45.0)
Hemoglobin: 14.8 g/dL (ref 11.7–15.5)
Lymphs Abs: 2245 cells/uL (ref 850–3900)
MCH: 27.9 pg (ref 27.0–33.0)
MCHC: 33.3 g/dL (ref 32.0–36.0)
MCV: 83.8 fL (ref 80.0–100.0)
MPV: 10 fL (ref 7.5–12.5)
Monocytes Relative: 5.8 %
Neutro Abs: 3859 cells/uL (ref 1500–7800)
Neutrophils Relative %: 57.6 %
Platelets: 295 10*3/uL (ref 140–400)
RBC: 5.3 10*6/uL — ABNORMAL HIGH (ref 3.80–5.10)
RDW: 13 % (ref 11.0–15.0)
Total Lymphocyte: 33.5 %
WBC: 6.7 10*3/uL (ref 3.8–10.8)

## 2022-06-21 LAB — HEMOGLOBIN A1C
Hgb A1c MFr Bld: 6.4 % of total Hgb — ABNORMAL HIGH (ref <5.7)
Mean Plasma Glucose: 137 mg/dL
eAG (mmol/L): 7.6 mmol/L

## 2022-10-02 ENCOUNTER — Other Ambulatory Visit: Payer: Self-pay | Admitting: Family Medicine

## 2022-10-02 DIAGNOSIS — G2581 Restless legs syndrome: Secondary | ICD-10-CM

## 2022-10-02 DIAGNOSIS — R252 Cramp and spasm: Secondary | ICD-10-CM

## 2022-10-11 ENCOUNTER — Other Ambulatory Visit: Payer: Self-pay | Admitting: Surgery

## 2022-10-11 DIAGNOSIS — Z1231 Encounter for screening mammogram for malignant neoplasm of breast: Secondary | ICD-10-CM

## 2022-11-16 ENCOUNTER — Ambulatory Visit
Admission: RE | Admit: 2022-11-16 | Discharge: 2022-11-16 | Disposition: A | Discharge status: Home / Self Care | Payer: BC Managed Care – PPO | Source: Ambulatory Visit | Attending: Surgery | Admitting: Surgery

## 2022-11-16 DIAGNOSIS — Z1231 Encounter for screening mammogram for malignant neoplasm of breast: Secondary | ICD-10-CM | POA: Insufficient documentation

## 2022-12-18 ENCOUNTER — Encounter: Payer: Self-pay | Admitting: Family Medicine

## 2022-12-18 ENCOUNTER — Ambulatory Visit (INDEPENDENT_AMBULATORY_CARE_PROVIDER_SITE_OTHER): Payer: BC Managed Care – PPO | Admitting: Family Medicine

## 2022-12-18 VITALS — BP 126/84 | HR 91 | Temp 98.0°F | Resp 16 | Ht 67.0 in | Wt 195.4 lb

## 2022-12-18 DIAGNOSIS — R252 Cramp and spasm: Secondary | ICD-10-CM

## 2022-12-18 DIAGNOSIS — G2581 Restless legs syndrome: Secondary | ICD-10-CM

## 2022-12-18 DIAGNOSIS — Z5181 Encounter for therapeutic drug level monitoring: Secondary | ICD-10-CM

## 2022-12-18 DIAGNOSIS — E782 Mixed hyperlipidemia: Secondary | ICD-10-CM | POA: Diagnosis not present

## 2022-12-18 DIAGNOSIS — I10 Essential (primary) hypertension: Secondary | ICD-10-CM

## 2022-12-18 DIAGNOSIS — M818 Other osteoporosis without current pathological fracture: Secondary | ICD-10-CM | POA: Diagnosis not present

## 2022-12-18 DIAGNOSIS — Z23 Encounter for immunization: Secondary | ICD-10-CM

## 2022-12-18 DIAGNOSIS — T386X5A Adverse effect of antigonadotrophins, antiestrogens, antiandrogens, not elsewhere classified, initial encounter: Secondary | ICD-10-CM

## 2022-12-18 DIAGNOSIS — R7303 Prediabetes: Secondary | ICD-10-CM

## 2022-12-18 DIAGNOSIS — Z780 Asymptomatic menopausal state: Secondary | ICD-10-CM

## 2022-12-18 MED ORDER — POTASSIUM CHLORIDE CRYS ER 10 MEQ PO TBCR: EXTENDED_RELEASE_TABLET | ORAL | 5 refills | Status: DC

## 2022-12-18 NOTE — Patient Instructions (Signed)
[facility] [address], [place], [place] [address] Scheduling phone #: [phone]  

## 2022-12-18 NOTE — Progress Notes (Signed)
Name: Shirley Blackwell   MRN: [id number]    DOB: 07-01-1955   Date:12/18/2022       Progress Note  Chief Complaint  Patient presents with   Follow-up   Hypertension   Hyperlipidemia     Subjective:   Shirley Blackwell is a 67 y.o. female, presents to clinic for follow-up on chronic conditions   Hypertension:  Currently managed on amlodipine-benazepril  Pt reports good med compliance and denies any SE.   Blood pressure today is well controlled. BP Readings from Last 3 Encounters:  12/18/22 126/84  06/20/22 120/86  01/12/22 (!) 140/86   Pt denies CP, SOB, exertional sx, LE edema, palpitation, Ha's, visual disturbances, lightheadedness, hypotension, syncope.   Prediabetes Lab Results  Component Value Date   HGBA1C 6.4 (H) 06/20/2022   She's been more thirsty and gained some weight concerned that A1C may be higher-recheck today  Zetia on HLD  Lab Results  Component Value Date   CHOL 217 (H) 06/20/2022   HDL 58 06/20/2022   LDLCALC 138 (H) 06/20/2022   TRIG 106 06/20/2022   CHOLHDL 3.7 06/20/2022     Current Outpatient Medications:    amLODipine-benazepril (LOTREL) 5-10 MG capsule, Take 1 capsule by mouth daily., Disp: 90 capsule, Rfl: 3   AZO-CRANBERRY PO, Take by mouth., Disp: , Rfl:    diphenhydrAMINE (BENADRYL) 25 MG tablet, Take 50 mg by mouth daily. , Disp: , Rfl:    ezetimibe (ZETIA) 10 MG tablet, Take 1 tablet (10 mg total) by mouth daily., Disp: 90 tablet, Rfl: 3   Garlic 1000 MG CAPS, , Disp: , Rfl:    KLOR-CON M10 10 MEQ tablet, TAKE 1 TABLET BY MOUTH DAILY AS NEEDED (CRAMPS AND MUSCLE SPASMS). USE ONLY 1-3 DAYS AT A TIME AND STOP USE IF NOT IMPROVEMENT IN SYMPTOMS, Disp: 10 tablet, Rfl: 1   Misc Natural Products (GLUCOSAMINE CHOND COMPLEX/MSM PO), Take 2 tablets by mouth daily., Disp: , Rfl:    Multiple Vitamins-Minerals (MULTIPLE VITAMINS/WOMENS PO), , Disp: , Rfl:    UNABLE TO FIND, Take 1,175 mg by mouth daily as needed. 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  0/10 lymph nodes.   COLONOSCOPY  2013, 2014   Dr. Evette Cristal   KNEE SURGERY Left 2014   MASTECTOMY Right 2012   invasive lobular CA,    Family History  Problem Relation Age of Onset   Diabetes Mother    Hypertension Mother    Kidney disease Mother  Hypertension Father    Prostate cancer Father    Cancer Sister        Shirley Blackwell   Diabetes Maternal Grandmother    Heart failure Maternal Grandmother    Heart failure Paternal Grandfather    Breast cancer Neg Hx     Social History   Tobacco Use   Smoking status: Never   Smokeless tobacco: Never  Vaping Use   Vaping Use: Never used  Substance Use Topics   Alcohol use: No   Drug use: No     Allergies  Allergen Reactions   Macrobid [Nitrofurantoin Macrocrystal]    Sulfa Antibiotics Hives    Other reaction(s): Hives   Tape Other (See Comments)    blisters    Health Maintenance  Topic Date Due   Zoster Vaccines-  Shingrix (1 of 2) 03/20/2023 (Originally 07/22/1974)   Pneumonia Vaccine 49+ Years old (2 of 2 - PCV) 12/18/2023 (Originally 07/22/2022)   INFLUENZA VACCINE  01/10/2023   MAMMOGRAM  11/16/2023   PAP SMEAR-Modifier  01/27/2024   Colonoscopy  01/31/2025   DTaP/Tdap/Td (3 - Td or Tdap) 12/17/2032   DEXA SCAN  Completed   Hepatitis C Screening  Completed   HIV Screening  Completed   HPV VACCINES  Aged Out   COVID-19 Vaccine  Discontinued    Chart Review Today: I personally reviewed active problem list, medication list, allergies, family history, social history, health maintenance, notes from last encounter, lab results, imaging with the patient/caregiver today.   Review of Systems  Constitutional: Negative.   HENT: Negative.    Eyes: Negative.   Respiratory: Negative.    Cardiovascular: Negative.   Gastrointestinal: Negative.   Endocrine: Negative.   Genitourinary: Negative.   Musculoskeletal: Negative.   Skin: Negative.   Allergic/Immunologic: Negative.   Neurological: Negative.   Hematological: Negative.   Psychiatric/Behavioral: Negative.    All other systems reviewed and are negative.    Objective:   Vitals:   12/18/22 1408  BP: 126/84  Pulse: 91  Resp: 16  Temp: 98 F (36.7 C)  TempSrc: Oral  SpO2: 96%  Weight: 195 lb 6.4 oz (88.6 kg)  Height: 5\' 7"  (1.702 m)    Body mass index is 30.6 kg/m.  Physical Exam Vitals and nursing note reviewed.  Constitutional:      Appearance: She is well-developed.  HENT:     Head: Normocephalic and atraumatic.     Nose: Nose normal.  Eyes:     General:        Right eye: No discharge.        Left eye: No discharge.     Conjunctiva/sclera: Conjunctivae normal.  Neck:     Trachea: No tracheal deviation.  Cardiovascular:     Rate and Rhythm: Normal rate and regular rhythm.  Pulmonary:     Effort: Pulmonary effort is normal. No respiratory distress.     Breath sounds: No stridor.  Musculoskeletal:        General:  Normal range of motion.  Skin:    General: Skin is warm and dry.     Findings: No rash.  Neurological:     Mental Status: She is alert.     Motor: No abnormal muscle tone.     Coordination: Coordination normal.  Psychiatric:        Behavior: Behavior normal.         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    Recheck bone density        Other   Prediabetes    A1c has been gradually increasing and patient has gained weight we would like to recheck today Last labs A1c was 6.4 -concern for new onset type 2 diabetes      Relevant Orders   COMPLETE METABOLIC PANEL WITH GFR (Completed)   Hemoglobin A1c (Completed)   Mixed hyperlipidemia    Managed on Zetia 10 mg daily      Muscle cramps    She manages intermittent symptoms with potassium supplement a few times a month      Relevant Medications   potassium chloride (KLOR-CON M10) 10 MEQ tablet   Restless legs    See muscle cramps above      Relevant Medications   potassium chloride (KLOR-CON M10) 10 MEQ tablet   Other Visit Diagnoses     Encounter for medication monitoring       Relevant Orders   COMPLETE METABOLIC PANEL WITH GFR (Completed)   Hemoglobin A1c (Completed)   Need for pneumococcal vaccination       She declined   Need for shingles vaccine       Offered but declined she can get at the pharmacy as well   Postmenopausal estrogen deficiency       Relevant Orders   DG Bone Density   Need for Tdap vaccination       Relevant Orders   Tdap vaccine greater than or equal to 7yo IM (Completed)          Return in about 6 months (around 06/20/2023) for Routine follow-up.   Danelle Berry, PA-C 12/18/22 2:38 PM

## 2022-12-19 ENCOUNTER — Other Ambulatory Visit: Payer: Self-pay | Admitting: Family Medicine

## 2022-12-19 DIAGNOSIS — E782 Mixed hyperlipidemia: Secondary | ICD-10-CM

## 2022-12-19 DIAGNOSIS — E119 Type 2 diabetes mellitus without complications: Secondary | ICD-10-CM | POA: Insufficient documentation

## 2022-12-19 DIAGNOSIS — I10 Essential (primary) hypertension: Secondary | ICD-10-CM

## 2022-12-19 LAB — COMPLETE METABOLIC PANEL WITH GFR
AG Ratio: 1.2 (calc) (ref 1.0–2.5)
ALT: 23 U/L (ref 6–29)
AST: 17 U/L (ref 10–35)
Albumin: 4.2 g/dL (ref 3.6–5.1)
Alkaline phosphatase (APISO): 88 U/L (ref 37–153)
BUN: 14 mg/dL (ref 7–25)
CO2: 28 mmol/L (ref 20–32)
Calcium: 9.8 mg/dL (ref 8.6–10.4)
Chloride: 105 mmol/L (ref 98–110)
Creat: 0.78 mg/dL (ref 0.50–1.05)
Globulin: 3.4 g/dL (calc) (ref 1.9–3.7)
Glucose, Bld: 90 mg/dL (ref 65–99)
Potassium: 4.3 mmol/L (ref 3.5–5.3)
Sodium: 142 mmol/L (ref 135–146)
Total Bilirubin: 0.4 mg/dL (ref 0.2–1.2)
Total Protein: 7.6 g/dL (ref 6.1–8.1)
eGFR: 84 mL/min/{1.73_m2} (ref >=60)

## 2022-12-19 LAB — HEMOGLOBIN A1C
Hgb A1c MFr Bld: 6.6 % of total Hgb — ABNORMAL HIGH (ref <5.7)
Mean Plasma Glucose: 143 mg/dL
eAG (mmol/L): 7.9 mmol/L

## 2022-12-19 MED ORDER — EZETIMIBE 10 MG PO TABS
10.0000 mg | ORAL_TABLET | Freq: Every day | ORAL | 1 refills | Status: DC
Start: 2022-12-19 — End: 2023-07-01

## 2022-12-19 MED ORDER — AMLODIPINE BESY-BENAZEPRIL HCL 5-10 MG PO CAPS
1.0000 | ORAL_CAPSULE | Freq: Every day | ORAL | 1 refills | Status: DC
Start: 2022-12-19 — End: 2023-07-01

## 2022-12-20 ENCOUNTER — Other Ambulatory Visit: Payer: Self-pay

## 2022-12-20 DIAGNOSIS — E119 Type 2 diabetes mellitus without complications: Secondary | ICD-10-CM

## 2022-12-27 ENCOUNTER — Encounter: Payer: Self-pay | Admitting: Family Medicine

## 2022-12-27 NOTE — Assessment & Plan Note (Signed)
Managed on Zetia 10 mg daily

## 2022-12-27 NOTE — Assessment & Plan Note (Signed)
Blood pressure well-controlled on current medications BP Readings from Last 3 Encounters:  12/18/22 126/84  06/20/22 120/86  01/12/22 (!) 140/86

## 2022-12-27 NOTE — Assessment & Plan Note (Signed)
Recheck bone density.  

## 2022-12-27 NOTE — Assessment & Plan Note (Signed)
A1c has been gradually increasing and patient has gained weight we would like to recheck today Last labs A1c was 6.4 -concern for new onset type 2 diabetes

## 2022-12-27 NOTE — Assessment & Plan Note (Signed)
She manages intermittent symptoms with potassium supplement a few times a month

## 2022-12-27 NOTE — Assessment & Plan Note (Signed)
See muscle cramps above

## 2023-01-10 ENCOUNTER — Ambulatory Visit
Admission: RE | Admit: 2023-01-10 | Discharge: 2023-01-10 | Disposition: A | Discharge status: Home / Self Care | Payer: BC Managed Care – PPO | Source: Ambulatory Visit | Attending: Family Medicine | Admitting: Family Medicine

## 2023-01-10 DIAGNOSIS — Z780 Asymptomatic menopausal state: Secondary | ICD-10-CM | POA: Diagnosis present

## 2023-06-14 ENCOUNTER — Encounter: Payer: Self-pay | Admitting: Internal Medicine

## 2023-06-21 ENCOUNTER — Ambulatory Visit: Payer: BC Managed Care – PPO | Admitting: Physician Assistant

## 2023-07-01 ENCOUNTER — Encounter: Payer: Self-pay | Admitting: Internal Medicine

## 2023-07-01 ENCOUNTER — Ambulatory Visit (INDEPENDENT_AMBULATORY_CARE_PROVIDER_SITE_OTHER): Payer: BC Managed Care – PPO | Admitting: Family Medicine

## 2023-07-01 ENCOUNTER — Encounter: Payer: Self-pay | Admitting: Family Medicine

## 2023-07-01 VITALS — BP 126/78 | HR 94 | Temp 97.9°F | Resp 18 | Ht 67.0 in | Wt 180.6 lb

## 2023-07-01 DIAGNOSIS — I10 Essential (primary) hypertension: Secondary | ICD-10-CM

## 2023-07-01 DIAGNOSIS — Z23 Encounter for immunization: Secondary | ICD-10-CM

## 2023-07-01 DIAGNOSIS — Z5181 Encounter for therapeutic drug level monitoring: Secondary | ICD-10-CM

## 2023-07-01 DIAGNOSIS — E119 Type 2 diabetes mellitus without complications: Secondary | ICD-10-CM

## 2023-07-01 DIAGNOSIS — G2581 Restless legs syndrome: Secondary | ICD-10-CM

## 2023-07-01 DIAGNOSIS — M818 Other osteoporosis without current pathological fracture: Secondary | ICD-10-CM

## 2023-07-01 DIAGNOSIS — E1169 Type 2 diabetes mellitus with other specified complication: Secondary | ICD-10-CM | POA: Diagnosis not present

## 2023-07-01 DIAGNOSIS — E782 Mixed hyperlipidemia: Secondary | ICD-10-CM | POA: Diagnosis not present

## 2023-07-01 DIAGNOSIS — Z5320 Procedure and treatment not carried out because of patient's decision for unspecified reasons: Secondary | ICD-10-CM

## 2023-07-01 DIAGNOSIS — B351 Tinea unguium: Secondary | ICD-10-CM

## 2023-07-01 DIAGNOSIS — T386X5A Adverse effect of antigonadotrophins, antiestrogens, antiandrogens, not elsewhere classified, initial encounter: Secondary | ICD-10-CM

## 2023-07-01 DIAGNOSIS — R252 Cramp and spasm: Secondary | ICD-10-CM

## 2023-07-01 MED ORDER — POTASSIUM CHLORIDE CRYS ER 10 MEQ PO TBCR: EXTENDED_RELEASE_TABLET | ORAL | 5 refills | Status: DC

## 2023-07-01 MED ORDER — ZOSTER VAC RECOMB ADJUVANTED 50 MCG/0.5ML IM SUSR: 0.5000 mL | Freq: Once | INTRAMUSCULAR | 1 refills | Status: AC

## 2023-07-01 MED ORDER — EZETIMIBE 10 MG PO TABS: 10.0000 mg | ORAL_TABLET | Freq: Every day | ORAL | 1 refills | Status: DC

## 2023-07-01 MED ORDER — AMLODIPINE BESY-BENAZEPRIL HCL 5-10 MG PO CAPS: 1.0000 | ORAL_CAPSULE | Freq: Every day | ORAL | 1 refills | Status: DC

## 2023-07-01 NOTE — Progress Notes (Signed)
Name: Shirley Blackwell   MRN: [id number]    DOB: 07/29/1955   Date:07/01/2023       Progress Note  Chief Complaint  Patient presents with   Medical Management of Chronic Issues   Diabetes   Hyperlipidemia   Hypertension     Subjective:   Shirley Blackwell is a 68 y.o. female, presents to clinic for f/up on routine conditions  Working with her health coach from insurance, exercising, changing diet, lost some weight   Hypertension:  Currently managed on amlodipine-benazepril 5-10  Pt reports good med compliance and denies any SE.   Blood pressure today is well controlled. BP Readings from Last 3 Encounters:  07/01/23 126/78  12/18/22 126/84  06/20/22 120/86   Pt denies CP, SOB, exertional sx, LE edema, palpitation, Ha's, visual disturbances, lightheadedness, hypotension, syncope. Dietary efforts for BP?     Hyperlipidemia: Currently treated with zetia, good compliance Family hx of severe statin intolerance and myopathy  Last Lipids: Lab Results  Component Value Date   CHOL 217 (H) 06/20/2022   HDL 58 06/20/2022   LDLCALC 138 (H) 06/20/2022   TRIG 106 06/20/2022   CHOLHDL 3.7 06/20/2022   - Denies: Chest pain, shortness of breath, myalgias, claudication  New onset DM last July after prediabetes for many years, pt is not on medications managing with diet/lifestyle DM:   Blood sugars not checking Denies: Polyuria, polydipsia, vision changes, neuropathy, hypoglycemia When eating more carbs she wakes up with dry mouth- wonders if its related Recent pertinent labs: Lab Results  Component Value Date   HGBA1C 6.6 (H) 12/18/2022   HGBA1C 6.4 (H) 06/20/2022   HGBA1C 6.4 (H) 09/12/2021   Lab Results  Component Value Date   MICROALBUR 33.4 09/12/2021   LDLCALC 138 (H) 06/20/2022   CREATININE 0.78 12/18/2022   Standard of care and health maintenance: Urine Microalbumin:  due Foot exam:  due DM eye exam:  due ACEI/ARB:  yes Statin:  no - discussed today  Hx of BCA  osteoporosis, she is UTD on her screenings Weight at home 176 this am  Wt Readings from Last 5 Encounters:  07/01/23 180 lb 9.6 oz (81.9 kg)  12/18/22 195 lb 6.4 oz (88.6 kg)  06/20/22 190 lb 9.6 oz (86.5 kg)  01/12/22 192 lb 8 oz (87.3 kg)  10/27/21 191 lb (86.6 kg)   BMI Readings from Last 5 Encounters:  07/01/23 28.29 kg/m  12/18/22 30.60 kg/m  06/20/22 29.85 kg/m  01/12/22 30.15 kg/m  10/27/21 29.91 kg/m       Current Outpatient Medications:    amLODipine-benazepril (LOTREL) 5-10 MG capsule, Take 1 capsule by mouth daily., Disp: 90 capsule, Rfl: 1   AZO-CRANBERRY PO, Take by mouth., Disp: , Rfl:    Calcium-Magnesium-Vitamin D (CALCIUM 1200+D3 PO), , Disp: , Rfl:    diphenhydrAMINE (BENADRYL) 25 MG tablet, Take 50 mg by mouth daily. , Disp: , Rfl:    ezetimibe (ZETIA) 10 MG tablet, Take 1 tablet (10 mg total) by mouth daily., Disp: 90 tablet, Rfl: 1   Garlic 1000 MG CAPS, , Disp: , Rfl:    Misc Natural Products (GLUCOSAMINE CHOND COMPLEX/MSM PO), Take 2 tablets by mouth daily., Disp: , Rfl:    Multiple Vitamins-Minerals (MULTIPLE VITAMINS/WOMENS PO), , Disp: , Rfl:    potassium chloride (KLOR-CON M10) 10 MEQ tablet, TAKE 1 TABLET BY MOUTH DAILY AS NEEDED (CRAMPS AND MUSCLE SPASMS). USE ONLY 1-3 DAYS AT A TIME AND STOP USE IF NOT IMPROVEMENT IN  SYMPTOMS, Disp: 10 tablet, Rfl: 5   UNABLE TO FIND, Take 1,175 mg by mouth daily as needed. 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  0/10 lymph nodes.   COLONOSCOPY  2013, 2014   Dr. Evette Cristal   KNEE SURGERY Left 2014   MASTECTOMY Right 2012   invasive lobular CA,    Family History  Problem Relation Age of Onset   Diabetes Mother    Hypertension Mother    Kidney disease Mother    Hypertension Father    Prostate cancer Father    Cancer Sister        [name]   Diabetes Maternal Grandmother    Heart failure Maternal Grandmother    Heart failure Paternal Grandfather    Breast cancer Neg Hx     Social History   Tobacco Use   Smoking status: Never   Smokeless tobacco: Never  Vaping Use   Vaping status: Never Used  Substance Use Topics   Alcohol use: No   Drug use: No     Allergies  Allergen Reactions   Macrobid [Nitrofurantoin Macrocrystal]    Sulfa Antibiotics Hives    Other reaction(s): Hives   Tape Other (See Comments)    blisters    Health Maintenance  Topic Date Due   FOOT EXAM  Never done   OPHTHALMOLOGY EXAM  Never done   Diabetic kidney evaluation - Urine ACR  09/13/2022   HEMOGLOBIN A1C  06/20/2023   INFLUENZA VACCINE  09/09/2023 (Originally 01/10/2023)   Zoster Vaccines- Shingrix (1 of 2) 09/29/2023 (Originally 07/22/1976)   Pneumonia Vaccine 92+ Years old (2 of 2 - PCV) 12/18/2023 (Originally 03/03/2013)   MAMMOGRAM  11/16/2023   Diabetic kidney evaluation - eGFR measurement  12/18/2023   DEXA SCAN  01/09/2025   Colonoscopy  01/31/2025   Cervical  Cancer Screening (HPV/Pap Cotest)  01/26/2026   DTaP/Tdap/Td (3 - Td or Tdap) 12/17/2032   Hepatitis C Screening  Completed   HIV Screening  Completed   HPV VACCINES  Aged Out   COVID-19 Vaccine  Discontinued    Chart Review Today: I personally reviewed active problem list, medication list, allergies, family history, social history, health maintenance, notes from last encounter, lab results, imaging with the patient/caregiver today.   Review of Systems  Constitutional: Negative.   HENT: Negative.    Eyes: Negative.   Respiratory: Negative.    Cardiovascular: Negative.   Gastrointestinal: Negative.   Endocrine: Negative.   Genitourinary:  Negative.   Musculoskeletal: Negative.   Skin: Negative.   Allergic/Immunologic: Negative.   Neurological: Negative.   Hematological: Negative.   Psychiatric/Behavioral: Negative.    All other systems reviewed and are negative.    Objective:   Vitals:   07/01/23 1342  BP: 126/78  Pulse: 94  Resp: 18  Temp: 97.9 F (36.6 C)  SpO2: 94%  Weight: 180 lb 9.6 oz (81.9 kg)  Height: 5\' 7"  (1.702 m)    Body mass index is 28.29 kg/m.  Physical Exam Vitals and nursing note reviewed.  Constitutional:      General: She is not in acute distress.    Appearance: She is well-developed. She is not ill-appearing, toxic-appearing or diaphoretic.  HENT:     Head: Normocephalic and atraumatic.     Nose: Nose normal.  Eyes:     General:        Right eye: No discharge.        Left eye: No discharge.     Conjunctiva/sclera: Conjunctivae normal.  Neck:     Trachea: No tracheal deviation.  Cardiovascular:     Rate and Rhythm: Normal rate and regular rhythm.     Pulses: Normal pulses.     Heart sounds: Normal heart sounds.  Pulmonary:     Effort: Pulmonary effort is normal. No respiratory distress.     Breath sounds: Normal breath sounds. No stridor.  Skin:    General: Skin is warm and dry.     Findings: No rash.  Neurological:     Mental  Status: She is alert.     Motor: No abnormal muscle tone.     Coordination: Coordination normal.  Psychiatric:        Mood and Affect: Mood normal.        Behavior: Behavior normal.     Diabetic Foot Exam - Simple   Simple Foot Form Diabetic Foot exam was performed with the following findings: Yes 07/01/2023  2:30 PM  Visual Inspection No deformities, no ulcerations, no other skin breakdown bilaterally: Yes Sensation Testing Intact to touch and monofilament testing bilaterally: Yes Pulse Check Posterior Tibialis and Dorsalis pulse intact bilaterally: Yes Comments Left foot 3rd and great toe with fungal disease         Assessment & Plan:   Problem List Items Addressed This Visit       Cardiovascular and Mediastinum   Benign essential HTN   Bp well controlled today and at goal and current med  BP Readings from Last 3 Encounters:  07/01/23 126/78  12/18/22 126/84  06/20/22 120/86         Relevant Medications   amLODipine-benazepril (LOTREL) 5-10 MG capsule   ezetimibe (ZETIA) 10 MG tablet   Other Relevant Orders   COMPLETE METABOLIC PANEL WITH GFR     Endocrine   New onset type 2 diabetes mellitus (HCC)   Lab Results  Component Value Date   HGBA1C 6.6 (H) 12/18/2022  she has worked on Psychologist, prison and probation services and lost weight, recheck labs today, DM food exam done, need to get record of DM eye exam        Relevant Medications   amLODipine-benazepril (LOTREL) 5-10 MG capsule   ezetimibe (ZETIA) 10 MG tablet   Other Relevant Orders   Hemoglobin A1c   Microalbumin / creatinine urine ratio   COMPLETE METABOLIC PANEL WITH GFR     Musculoskeletal and Integument   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   she is doing supplements, working on weight  bearing activity       Relevant Medications   Calcium-Magnesium-Vitamin D (CALCIUM 1200+D3 PO)     Other   Mixed hyperlipidemia - Primary   Managed on zetia Lab Results  Component Value Date   CHOL 217 (H) 06/20/2022    HDL 58 06/20/2022   LDLCALC 138 (H) 06/20/2022   TRIG 106 06/20/2022   CHOLHDL 3.7 06/20/2022  LDL and total cholesterol high, on zetia and working on diet, she refuses statin due to sig family hx of myalgia/myopathy in multiple family members        Relevant Medications   amLODipine-benazepril (LOTREL) 5-10 MG capsule   ezetimibe (ZETIA) 10 MG tablet   Other Relevant Orders   COMPLETE METABOLIC PANEL WITH GFR   Lipid panel   Muscle cramps   potassium Rx few times a month helps with managing sx, refills given       Relevant Medications   potassium chloride (KLOR-CON M10) 10 MEQ tablet   Restless legs   Relevant Medications   potassium chloride (KLOR-CON M10) 10 MEQ tablet   Other Visit Diagnoses       Encounter for medication monitoring       Relevant Orders   Hemoglobin A1c   Microalbumin / creatinine urine ratio   COMPLETE METABOLIC PANEL WITH GFR   CBC with Differential/Platelet   Lipid panel     Need for pneumococcal vaccination         Need for shingles vaccine       Relevant Medications   Zoster Vaccine Adjuvanted [facility]) injection     Nail fungal infection       will start po med after LFTs checked   Relevant Medications   Zoster Vaccine Adjuvanted [facility]) injection     Statin declined       due to multiple intolerances and myopathies in her family, she is not willing to take it        Return in about 6 months (around 12/29/2023) for Routine follow-up.   Danelle Berry, PA-C 07/01/23 2:16 PM

## 2023-07-01 NOTE — Assessment & Plan Note (Signed)
Bp well controlled today and at goal and current med  BP Readings from Last 3 Encounters:  07/01/23 126/78  12/18/22 126/84  06/20/22 120/86

## 2023-07-01 NOTE — Assessment & Plan Note (Signed)
potassium Rx few times a month helps with managing sx, refills given

## 2023-07-01 NOTE — Assessment & Plan Note (Signed)
she is doing supplements, working on weight bearing activity

## 2023-07-01 NOTE — Assessment & Plan Note (Addendum)
Managed on zetia Lab Results  Component Value Date   CHOL 217 (H) 06/20/2022   HDL 58 06/20/2022   LDLCALC 138 (H) 06/20/2022   TRIG 106 06/20/2022   CHOLHDL 3.7 06/20/2022  LDL and total cholesterol high, on zetia and working on diet, she refuses statin due to sig family hx of myalgia/myopathy in multiple family members

## 2023-07-01 NOTE — Assessment & Plan Note (Addendum)
Lab Results  Component Value Date   HGBA1C 6.6 (H) 12/18/2022  she has worked on diet/lifestyle and lost weight, recheck labs today, DM food exam done, need to get record of DM eye exam

## 2023-07-02 ENCOUNTER — Encounter: Payer: Self-pay | Admitting: Family Medicine

## 2023-07-02 DIAGNOSIS — B351 Tinea unguium: Secondary | ICD-10-CM

## 2023-07-02 LAB — CBC WITH DIFFERENTIAL/PLATELET
Absolute Lymphocytes: 2588 {cells}/uL (ref 850–3900)
Absolute Monocytes: 393 {cells}/uL (ref 200–950)
Basophils Absolute: 69 {cells}/uL (ref 0–200)
Basophils Relative: 1 %
Eosinophils Absolute: 193 {cells}/uL (ref 15–500)
Eosinophils Relative: 2.8 %
HCT: 45.9 % — ABNORMAL HIGH (ref 35.0–45.0)
Hemoglobin: 15 g/dL (ref 11.7–15.5)
MCH: 28.2 pg (ref 27.0–33.0)
MCHC: 32.7 g/dL (ref 32.0–36.0)
MCV: 86.3 fL (ref 80.0–100.0)
MPV: 10.4 fL (ref 7.5–12.5)
Monocytes Relative: 5.7 %
Neutro Abs: 3657 {cells}/uL (ref 1500–7800)
Neutrophils Relative %: 53 %
Platelets: 258 10*3/uL (ref 140–400)
RBC: 5.32 10*6/uL — ABNORMAL HIGH (ref 3.80–5.10)
RDW: 12.9 % (ref 11.0–15.0)
Total Lymphocyte: 37.5 %
WBC: 6.9 10*3/uL (ref 3.8–10.8)

## 2023-07-02 LAB — COMPLETE METABOLIC PANEL WITH GFR
AG Ratio: 1.4 (calc) (ref 1.0–2.5)
ALT: 19 U/L (ref 6–29)
AST: 17 U/L (ref 10–35)
Albumin: 4.4 g/dL (ref 3.6–5.1)
Alkaline phosphatase (APISO): 87 U/L (ref 37–153)
BUN: 14 mg/dL (ref 7–25)
CO2: 29 mmol/L (ref 20–32)
Calcium: 9.9 mg/dL (ref 8.6–10.4)
Chloride: 103 mmol/L (ref 98–110)
Creat: 0.76 mg/dL (ref 0.50–1.05)
Globulin: 3.2 g/dL (ref 1.9–3.7)
Glucose, Bld: 101 mg/dL — ABNORMAL HIGH (ref 65–99)
Potassium: 4 mmol/L (ref 3.5–5.3)
Sodium: 141 mmol/L (ref 135–146)
Total Bilirubin: 0.5 mg/dL (ref 0.2–1.2)
Total Protein: 7.6 g/dL (ref 6.1–8.1)
eGFR: 87 mL/min/{1.73_m2} (ref >=60)

## 2023-07-02 LAB — HEMOGLOBIN A1C
Hgb A1c MFr Bld: 6.2 %{Hb} — ABNORMAL HIGH (ref <5.7)
Mean Plasma Glucose: 131 mg/dL
eAG (mmol/L): 7.3 mmol/L

## 2023-07-02 LAB — MICROALBUMIN / CREATININE URINE RATIO
Creatinine, Urine: 153 mg/dL (ref 20–275)
Microalb Creat Ratio: 92 mg/g{creat} — ABNORMAL HIGH (ref <30)
Microalb, Ur: 14.1 mg/dL

## 2023-07-02 LAB — LIPID PANEL
Cholesterol: 192 mg/dL (ref <200)
HDL: 55 mg/dL (ref >=50)
LDL Cholesterol (Calc): 112 mg/dL — ABNORMAL HIGH
Non-HDL Cholesterol (Calc): 137 mg/dL — ABNORMAL HIGH (ref <130)
Total CHOL/HDL Ratio: 3.5 (calc) (ref <5.0)
Triglycerides: 134 mg/dL (ref <150)

## 2023-07-03 MED ORDER — TERBINAFINE HCL 250 MG PO TABS: 250.0000 mg | ORAL_TABLET | Freq: Every day | ORAL | 0 refills | Status: DC

## 2023-07-08 ENCOUNTER — Encounter: Payer: Self-pay | Admitting: Internal Medicine

## 2023-08-30 ENCOUNTER — Encounter: Payer: Self-pay | Admitting: Internal Medicine

## 2023-09-04 ENCOUNTER — Ambulatory Visit (INDEPENDENT_AMBULATORY_CARE_PROVIDER_SITE_OTHER): Admitting: Podiatry

## 2023-09-04 ENCOUNTER — Ambulatory Visit (INDEPENDENT_AMBULATORY_CARE_PROVIDER_SITE_OTHER)

## 2023-09-04 ENCOUNTER — Encounter: Payer: Self-pay | Admitting: Podiatry

## 2023-09-04 DIAGNOSIS — M722 Plantar fascial fibromatosis: Secondary | ICD-10-CM

## 2023-09-04 MED ORDER — TRIAMCINOLONE ACETONIDE 40 MG/ML IJ SUSP
20.0000 mg | Freq: Once | INTRAMUSCULAR | Status: AC
  Administered 2023-09-04: 20 mg

## 2023-09-04 MED ORDER — METHYLPREDNISOLONE 4 MG PO TBPK: ORAL_TABLET | ORAL | 0 refills | Status: DC

## 2023-09-04 MED ORDER — MELOXICAM 15 MG PO TABS: 15.0000 mg | ORAL_TABLET | Freq: Every day | ORAL | 3 refills | Status: DC

## 2023-09-04 NOTE — Progress Notes (Signed)
 Subjective:  Patient ID: Shirley Blackwell, female    DOB: 10-09-1955,  MRN: [id number] HPI Chief Complaint  Patient presents with   Foot Pain    Plantar heel right - aching x 3 weeks, AM pain, radiating to lateral heel, has been walking for exercise on uneven surfaces recently, tried Ibuprofen, ice, heat and tennis ball massage-no help   Nail Problem    Hallux left - toenail discoloration, PCP has been treating her with oral terbinafine-90 day course, 30 days left    New Patient (Initial Visit)    Diabetic - last A1c was 6.2    68 y.o. female presents with the above complaint.   ROS: Denies fever chills nausea vomit muscle aches pains calf pain back pain chest pain shortness of breath.  She presents with her husband Shirley Blackwell.  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   Cancer of right breast (HCC)    right mastectomy invasive lobular CA,    Colon cancer (HCC)    Pre cancerous polyps   Complication of anesthesia    Hepatitis    Hx of lumpectomy 2012   right   Hypertension 1999   Personal history of malignant neoplasm of breast 2012   R Mastectomy,   PONV (postoperative nausea and vomiting)    Prediabetes 12/06/2014   Shingles    Past Surgical History:  Procedure Laterality Date   BREAST BIOPSY Left 2000, 2002   core bx- neg   CHOLECYSTECTOMY N/A 12/08/2019   Procedure: LAPAROSCOPIC CHOLECYSTECTOMY WITH INTRAOPERATIVE CHOLANGIOGRAM;  Surgeon: Earline Mayotte, MD;  Location: [facility] ORS;  Service: General;  Laterality: N/A;   COLECTOMY Right 12/14/2011   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  0/10 lymph nodes.   COLONOSCOPY  2013, 2014   Dr. Evette Cristal   KNEE SURGERY Left 2014   MASTECTOMY Right 2012   invasive lobular CA,    Current Outpatient Medications:    meloxicam (MOBIC) 15 MG tablet, Take 1  tablet (15 mg total) by mouth daily., Disp: 30 tablet, Rfl: 3   methylPREDNISolone (MEDROL DOSEPAK) 4 MG TBPK tablet, 6 day dose pack - take as directed, Disp: 21 tablet, Rfl: 0   amLODipine-benazepril (LOTREL) 5-10 MG capsule, Take 1 capsule by mouth daily., Disp: 90 capsule, Rfl: 1   AZO-CRANBERRY PO, Take by mouth., Disp: , Rfl:    Calcium-Magnesium-Vitamin D (CALCIUM 1200+D3 PO), , Disp: , Rfl:    diphenhydrAMINE (BENADRYL) 25 MG tablet, Take 50 mg by mouth daily. , Disp: , Rfl:    ezetimibe (ZETIA) 10 MG tablet, Take 1 tablet (10 mg total) by mouth daily., Disp: 90 tablet, Rfl: 1   Garlic 1000 MG CAPS, , Disp: , Rfl:    Misc Natural Products (GLUCOSAMINE CHOND COMPLEX/MSM PO), Take 2 tablets by mouth daily., Disp: , Rfl:    Multiple Vitamins-Minerals (MULTIPLE VITAMINS/WOMENS PO), , Disp: , Rfl:    potassium chloride (KLOR-CON M10) 10 MEQ tablet, TAKE 1 TABLET BY MOUTH DAILY AS NEEDED (CRAMPS AND MUSCLE SPASMS). USE ONLY 1-3 DAYS AT A TIME AND STOP USE IF NOT IMPROVEMENT IN SYMPTOMS, Disp: 10 tablet, Rfl: 5   terbinafine (LAMISIL) 250 MG tablet, Take 1 tablet (250 mg total) by mouth daily., Disp: 90 tablet, Rfl: 0   UNABLE TO FIND, Take 1,175 mg by mouth daily  as needed. Med Name: Cyleon Cinnamon /Apple Cider Vinegar, Disp: , Rfl:   Allergies  Allergen Reactions   Macrobid [Nitrofurantoin Macrocrystal]    Sulfa Antibiotics Hives    Other reaction(s): Hives   Tape Other (See Comments)    blisters   Review of Systems Objective:  There were no vitals filed for this visit.  General: Well developed, nourished, in no acute distress, alert and oriented x3   Dermatological: Skin is warm, dry and supple bilateral. Nails x 10 are well maintained; remaining integument appears unremarkable at this time. There are no open sores, no preulcerative lesions, no rash or signs of infection present.  Onychomycosis is present to the first and third digits of the left foot currently be treated by her  primary care provider with 90 days of Lamisil.  She still has 30 days to go.  Vascular: Dorsalis Pedis artery and Posterior Tibial artery pedal pulses are 2/4 bilateral with immedate capillary fill time. Pedal hair growth present. No varicosities and no lower extremity edema present bilateral.   Neruologic: Grossly intact via light touch bilateral. Vibratory intact via tuning fork bilateral. Protective threshold with Semmes Wienstein monofilament intact to all pedal sites bilateral. Patellar and Achilles deep tendon reflexes 2+ bilateral. No Babinski or clonus noted bilateral.   Musculoskeletal: No gross boney pedal deformities bilateral. No pain, crepitus, or limitation noted with foot and ankle range of motion bilateral. Muscular strength 5/5 in all groups tested bilateral.  Moderate to severe pain on palpation of the right heel today.  The majority the pain is located overlying the plantar fascial calcaneal insertion site medially and the central.  She has some tenderness laterally as well.  Achilles is nontender on palpation.  Gait: Unassisted, Nonantalgic.    Radiographs:  Radiographs taken today demonstrate osseously mature heel with good bone mineralization.  Soft tissue increase in density at the plantar fascial calcaneal insertion site consistent with plantar fasciitis.  No significant osseous abnormalities no acute findings.  Assessment & Plan:   Assessment: Plantar fasciitis right heel.  Onychomycosis hallux and third digit left foot.  Plan: Discussed etiology pathology conservative surgical therapies at this point started her on methylprednisolone to be followed by meloxicam.  She already has a fascial brace.  Injected the heel with 20 mg of Kenalog 5 mg Marcaine for maximal tenderness.  Tolerated procedure well without complications discussed appropriate shoe gear stretching exercise ice therapy and shoe gear modifications.     Burnell Matlin T. [place], [place]

## 2023-09-04 NOTE — Patient Instructions (Signed)

## 2023-10-01 ENCOUNTER — Other Ambulatory Visit: Payer: Self-pay | Admitting: Family Medicine

## 2023-10-01 DIAGNOSIS — B351 Tinea unguium: Secondary | ICD-10-CM

## 2023-10-02 ENCOUNTER — Encounter: Payer: Self-pay | Admitting: Podiatry

## 2023-10-02 ENCOUNTER — Ambulatory Visit (INDEPENDENT_AMBULATORY_CARE_PROVIDER_SITE_OTHER): Admitting: Podiatry

## 2023-10-02 DIAGNOSIS — M79609 Pain in unspecified limb: Secondary | ICD-10-CM

## 2023-10-02 DIAGNOSIS — B351 Tinea unguium: Secondary | ICD-10-CM

## 2023-10-02 DIAGNOSIS — M722 Plantar fascial fibromatosis: Secondary | ICD-10-CM | POA: Diagnosis not present

## 2023-10-02 MED ORDER — TERBINAFINE HCL 250 MG PO TABS: 250.0000 mg | ORAL_TABLET | Freq: Every day | ORAL | 0 refills | Status: DC

## 2023-10-02 NOTE — Telephone Encounter (Signed)
 Duplicate request, refilled 10/01/23.  Requested Prescriptions  Pending Prescriptions Disp Refills   terbinafine  (LAMISIL ) 250 MG tablet [Pharmacy Med Name: TERBINAFINE  HCL 250 MG TABLET] 90 tablet 0    Sig: TAKE 1 TABLET BY MOUTH EVERY DAY     Off-Protocol Failed - 10/02/2023  3:02 PM      Failed - Medication not assigned to a protocol, review manually.      Failed - Valid encounter within last 12 months    Recent Outpatient Visits   None     Future Appointments             In 2 months Tapia, Leisa, PA-C [facility], [facility]

## 2023-10-02 NOTE — Progress Notes (Signed)
 She presents today states that I think is doing better she refers to the plantar fasciitis.  She states that she was unable to take the prednisone when we had recommended it because she has been having to take care of her 3 grandchildren due to their mother being in the hospital with preeclampsia.  She goes on the inquire about her toenails who her primary doctor is taking care of had prescribed her 90 days worth of Lamisil .  She states that they look better but they are not completely better.  She denies any problems taking the medication.  Objective: Vital signs are stable she is alert and oriented x 3 she is has some tenderness on palpation medial calcaneal tubercle of the right heel.  She relates that is about 80% improved.  Not having evaluated the nails on the last time she comes in I really do not know from the start of her medication where she is now that her nails are starting to clear proximally it appears.  Assessment: Long-term therapy with Lamisil .  Plantar fasciitis resolving 70 to 80%.  Plan: Provided her with another 30 tablets 1 tablet every day for the next month.  This is Lamisil  250 mg.  I also injected her right heel 20 mg Kenalog  5 mg left point maximal tenderness.  Tolerated procedure well without complications.  I will follow-up with her in 1 month at which time we will evaluate the plantar fasciitis as well as the onychomycosis.  If we need to continue Lamisil  therapy then we will start an every other day regimen with her.  Lab work will also be necessary.

## 2023-10-03 DIAGNOSIS — M722 Plantar fascial fibromatosis: Secondary | ICD-10-CM | POA: Diagnosis not present

## 2023-10-03 MED ORDER — TRIAMCINOLONE ACETONIDE 40 MG/ML IJ SUSP
20.0000 mg | Freq: Once | INTRAMUSCULAR | Status: AC
Start: 2023-10-03 — End: 2023-10-03
  Administered 2023-10-03: 20 mg

## 2023-10-03 NOTE — Addendum Note (Signed)
 Addended by: Sanda Crome on: 10/03/2023 08:40 AM   Modules accepted: Orders, Level of Service

## 2023-10-07 ENCOUNTER — Other Ambulatory Visit: Payer: Self-pay | Admitting: Surgery

## 2023-10-07 DIAGNOSIS — Z1231 Encounter for screening mammogram for malignant neoplasm of breast: Secondary | ICD-10-CM

## 2023-10-30 ENCOUNTER — Encounter: Payer: Self-pay | Admitting: Podiatry

## 2023-10-30 ENCOUNTER — Ambulatory Visit (INDEPENDENT_AMBULATORY_CARE_PROVIDER_SITE_OTHER): Admitting: Podiatry

## 2023-10-30 DIAGNOSIS — M722 Plantar fascial fibromatosis: Secondary | ICD-10-CM | POA: Diagnosis not present

## 2023-10-30 DIAGNOSIS — B351 Tinea unguium: Secondary | ICD-10-CM

## 2023-10-30 MED ORDER — TRIAMCINOLONE ACETONIDE 40 MG/ML IJ SUSP
20.0000 mg | Freq: Once | INTRAMUSCULAR | Status: AC
  Administered 2023-10-30: 20 mg

## 2023-10-30 MED ORDER — TERBINAFINE HCL 250 MG PO TABS: 250.0000 mg | ORAL_TABLET | Freq: Every day | ORAL | 0 refills | Status: DC

## 2023-10-30 MED ORDER — NABUMETONE 750 MG PO TABS: 750.0000 mg | ORAL_TABLET | Freq: Two times a day (BID) | ORAL | 3 refills | Status: AC

## 2023-10-30 NOTE — Progress Notes (Signed)
 She presents today for follow-up of her plantar fasciitis states is doing better but is still hurting some.  Objective: Vital signs are stable alert oriented x 3.  Pulses are palpable.  Toenails are looking much better with long-term therapy of Lamisil .  She has pain on palpation medial calcaneal tubercle of the right heel.  Assessment: Plantar fasciitis resolving right onychomycosis is resolving.  Plan: Start her on another dose of every other day of Lamisil .  I also placed in a plantar fascial brace after an injection phone 10 mg of Kenalog .  And also changed her mobic  to Relafen.

## 2023-10-30 NOTE — Patient Instructions (Signed)
Dr. Hyatt has sent over a refill for Lamisil to your pharmacy today. The instructions on your bottle will say "take 1 tablet daily", however, he would like for you to take one pill every other day. He will follow up with you in 3 months to re-evaluate your toenails. 

## 2023-11-18 ENCOUNTER — Encounter

## 2023-11-19 ENCOUNTER — Encounter

## 2023-11-27 ENCOUNTER — Ambulatory Visit (INDEPENDENT_AMBULATORY_CARE_PROVIDER_SITE_OTHER): Admitting: Podiatry

## 2023-11-27 ENCOUNTER — Other Ambulatory Visit: Payer: Self-pay | Admitting: Podiatry

## 2023-11-27 ENCOUNTER — Encounter: Payer: Self-pay | Admitting: Podiatry

## 2023-11-27 DIAGNOSIS — M722 Plantar fascial fibromatosis: Secondary | ICD-10-CM | POA: Diagnosis not present

## 2023-11-27 DIAGNOSIS — B351 Tinea unguium: Secondary | ICD-10-CM

## 2023-11-27 MED ORDER — TERBINAFINE HCL 250 MG PO TABS: 250.0000 mg | ORAL_TABLET | Freq: Every day | ORAL | 0 refills | Status: DC

## 2023-11-27 NOTE — Progress Notes (Signed)
 She presents today for follow-up of her plantar fasciitis of her right foot.  She states that is doing so much better and she has no pain at all.  She is also here for follow-up of her fungus nails she still has another month left to take on her every other day dosing.  Objective: Vital signs are stable alert and oriented x 3.  Pulses are palpable.  Toenails boot appear to be healing very nicely the worst nail hallux right demonstrates a approximately 50% clearance.  Third nail left foot is 100% clear and the hallux left is nearly 75 to 85% clear.  She has no reproducible pain on palpation medial calcaneal tubercle of the right heel.  Assessment: Well-healing plantar fasciitis well-healing onychomycosis long-term therapy.  Plan: Discussed etiology pathology conservative versus surgical therapies at this point organ to continue her every other day therapy and dispensed another 30 tablets for 60 days.

## 2023-12-24 ENCOUNTER — Other Ambulatory Visit: Payer: Self-pay | Admitting: Podiatry

## 2023-12-30 ENCOUNTER — Encounter: Payer: Self-pay | Admitting: Family Medicine

## 2024-01-20 ENCOUNTER — Ambulatory Visit: Admitting: Family Medicine

## 2024-01-20 ENCOUNTER — Encounter: Payer: Self-pay | Admitting: Family Medicine

## 2024-01-20 VITALS — BP 132/70 | HR 85 | Resp 16 | Ht 67.0 in | Wt 182.0 lb

## 2024-01-20 DIAGNOSIS — Z23 Encounter for immunization: Secondary | ICD-10-CM

## 2024-01-20 DIAGNOSIS — I10 Essential (primary) hypertension: Secondary | ICD-10-CM

## 2024-01-20 DIAGNOSIS — E782 Mixed hyperlipidemia: Secondary | ICD-10-CM

## 2024-01-20 DIAGNOSIS — Z5181 Encounter for therapeutic drug level monitoring: Secondary | ICD-10-CM

## 2024-01-20 DIAGNOSIS — Z0001 Encounter for general adult medical examination with abnormal findings: Secondary | ICD-10-CM

## 2024-01-20 DIAGNOSIS — Z0000 Encounter for general adult medical examination without abnormal findings: Secondary | ICD-10-CM

## 2024-01-20 DIAGNOSIS — E119 Type 2 diabetes mellitus without complications: Secondary | ICD-10-CM

## 2024-01-20 NOTE — Patient Instructions (Signed)
 Health Maintenance  Topic Date Due   Eye exam for diabetics  Never done   Pneumococcal Vaccine for age over 15 (2 of 2 - PCV) 03/03/2013   Mammogram  11/16/2023   Hemoglobin A1C  12/29/2023   Zoster (Shingles) Vaccine (1 of 2) 04/21/2024*   Flu Shot  09/08/2024*   Yearly kidney function blood test for diabetes  06/30/2024   Yearly kidney health urinalysis for diabetes  06/30/2024   Complete foot exam   06/30/2024   DEXA scan (bone density measurement)  01/09/2025   Colon Cancer Screening  01/31/2025   DTaP/Tdap/Td vaccine (3 - Td or Tdap) 12/17/2032   Hepatitis C Screening  Completed   Hepatitis B Vaccine  Aged Out   HPV Vaccine  Aged Out   Meningitis B Vaccine  Aged Out   COVID-19 Vaccine  Discontinued  *Topic was postponed. The date shown is not the original due date.   Please call your eye doctor and request a diabetic eye exam and let us  know where you get it done so we can get the record.  You are due for mammogram, labs for diabetes, pneumococcal vaccine.

## 2024-01-20 NOTE — Progress Notes (Unsigned)
 Patient: Shirley Blackwell, Female    DOB: 1956-06-01, 68 y.o.   MRN: [id number] Leavy Mole, PA-C Visit Date: 01/20/2024  Today's Provider: Mole Leavy, PA-C   Chief Complaint  Patient presents with   Annual Exam   Subjective:   Annual physical exam:  Shirley Blackwell is a 68 y.o. female who presents today for complete physical exam:  Exercise/Activity:  increased activity which then was limited due to plantar fasciitis  Diet/nutrition:    Sleep:    SDOH Screenings   Food Insecurity: No Food Insecurity (01/20/2024)  Housing: Low Risk  (01/20/2024)  Transportation Needs: No Transportation Needs (01/20/2024)  Utilities: Not At Risk (01/20/2024)  Alcohol Screen: Low Risk  (01/20/2024)  Depression (PHQ2-9): Low Risk  (01/20/2024)  Financial Resource Strain: Low Risk  (01/20/2024)  Physical Activity: Insufficiently Active (01/20/2024)  Social Connections: Socially Integrated (01/20/2024)  Stress: No Stress Concern Present (01/20/2024)  Tobacco Use: Low Risk  (01/20/2024)  Health Literacy: Adequate Health Literacy (01/20/2024)    USPSTF grade A and B recommendations - reviewed and addressed today  Depression:  Phq 9 completed today by patient, was reviewed by me with patient in the room PHQ score is neg, pt feels ***    01/20/2024    2:47 PM 07/01/2023    1:44 PM 12/18/2022    2:05 PM 06/20/2022    1:02 PM  PHQ 2/9 Scores  PHQ - 2 Score 0 0 2 6  PHQ- 9 Score  0 2       01/20/2024    2:47 PM 07/01/2023    1:44 PM 12/18/2022    2:05 PM 06/20/2022    1:02 PM 02/26/2022   11:05 AM  Depression screen PHQ 2/9  Decreased Interest 0 0 1 3 0  Down, Depressed, Hopeless 0 0 1 3 0  PHQ - 2 Score 0 0 2 6 0  Altered sleeping  0 0  0  Tired, decreased energy  0 0  0  Change in appetite  0 0  0  Feeling bad or failure about yourself   0 0  0  Trouble concentrating  0 0  0  Moving slowly or fidgety/restless  0 0  0  Suicidal thoughts  0 0  0  PHQ-9 Score  0 2  0  Difficult doing work/chores    Somewhat difficult  Not difficult at all    Alcohol screening: Flowsheet Row Office Visit from 01/20/2024 in [facility]  AUDIT-C Score 0    Immunizations and Health Maintenance: Health Maintenance  Topic Date Due   MAMMOGRAM  11/16/2023   HEMOGLOBIN A1C  12/29/2023   OPHTHALMOLOGY EXAM  01/20/2024 (Originally 07/23/1967)   Zoster Vaccines- Shingrix (1 of 2) 04/21/2024 (Originally 07/22/1976)   INFLUENZA VACCINE  09/08/2024 (Originally 01/10/2024)   Pneumococcal Vaccine: 50+ Years (2 of 2 - PCV) 01/19/2025 (Originally 03/03/2013)   Diabetic kidney evaluation - eGFR measurement  06/30/2024   Diabetic kidney evaluation - Urine ACR  06/30/2024   FOOT EXAM  06/30/2024   DEXA SCAN  01/09/2025   Colonoscopy  01/31/2025   DTaP/Tdap/Td (3 - Td or Tdap) 12/17/2032   Hepatitis C Screening  Completed   Hepatitis B Vaccines  Aged Out   HPV VACCINES  Aged Out   Meningococcal B Vaccine  Aged Out   COVID-19 Vaccine  Discontinued     Hep C Screening: done  STD testing and prevention (HIV/chl/gon/syphilis):  see above, no additional testing  desired by pt today  Intimate partner violence:  safe   Sexual History/Pain during Intercourse: Married  Menstrual History/LMP/Abnormal Bleeding: none  No LMP recorded. Patient is postmenopausal.  Incontinence Symptoms: none - improved with supplements   Breast cancer: schedule  Last Mammogram: *see HM list above BRCA gene screening: ***  Cervical cancer screening: aged out  Pt *** family hx of cancers - breast, ovarian, uterine, colon:     Osteoporosis:   2 years 2026 Discussion on osteoporosis per age, including high calcium and vitamin D supplementation, weight bearing exercises Pt is supplementing with daily calcium/Vit D. Weight bearing activity as able   Skin cancer:  Hx of skin CA -  NO*** Discussed atypical lesions   Colorectal cancer:   Colonoscopy is UTD due in 2026  Discussed concerning signs and sx of  CRC, pt denies ***  Lung cancer:   Low Dose CT Chest recommended if Age 20-80 years, 20 pack-year currently smoking OR have quit w/in 15years. Patient {DOES NOT does:27190::does not} qualify.    Social History   Tobacco Use   Smoking status: Never   Smokeless tobacco: Never  Vaping Use   Vaping status: Never Used  Substance Use Topics   Alcohol use: No   Drug use: No     Flowsheet Row Office Visit from 01/20/2024 in [facility]  AUDIT-C Score 0    Family History  Problem Relation Age of Onset   Diabetes Mother    Hypertension Mother    Kidney disease Mother    Hypertension Father    Prostate cancer Father    Cancer Sister        murrel   Diabetes Maternal Grandmother    Heart failure Maternal Grandmother    Heart failure Paternal Grandfather    Breast cancer Neg Hx      Blood pressure/Hypertension: BP Readings from Last 3 Encounters:  01/20/24 132/70  07/01/23 126/78  12/18/22 126/84    Weight/Obesity: Wt Readings from Last 3 Encounters:  01/20/24 182 lb (82.6 kg)  07/01/23 180 lb 9.6 oz (81.9 kg)  12/18/22 195 lb 6.4 oz (88.6 kg)   BMI Readings from Last 3 Encounters:  01/20/24 28.51 kg/m  07/01/23 28.29 kg/m  12/18/22 30.60 kg/m     Lipids:  Lab Results  Component Value Date   CHOL 192 07/01/2023   CHOL 217 (H) 06/20/2022   CHOL 241 (H) 09/12/2021   Lab Results  Component Value Date   HDL 55 07/01/2023   HDL 58 06/20/2022   HDL 56 09/12/2021   Lab Results  Component Value Date   LDLCALC 112 (H) 07/01/2023   LDLCALC 138 (H) 06/20/2022   LDLCALC 156 (H) 09/12/2021   Lab Results  Component Value Date   TRIG 134 07/01/2023   TRIG 106 06/20/2022   TRIG 158 (H) 09/12/2021   Lab Results  Component Value Date   CHOLHDL 3.5 07/01/2023   CHOLHDL 3.7 06/20/2022   CHOLHDL 4.3 09/12/2021   No results found for: LDLDIRECT Based on the results of lipid panel his/her cardiovascular risk factor ( using Poole  Cohort )  in the next 10 years is: The 10-year ASCVD risk score (Arnett DK, et al., 2019) is: 16%   Values used to calculate the score:     Age: 24 years     Clincally relevant sex: Female     Is Non-Hispanic African American: No     Diabetic: Yes     Tobacco smoker:  No     Systolic Blood Pressure: 132 mmHg     Is BP treated: Yes     HDL Cholesterol: 55 mg/dL     Total Cholesterol: 192 mg/dL  Glucose:  Glucose  Date Value Ref Range Status  09/07/2014 113 (H) mg/dL Final    Comment:    34-00 NOTE: New Reference Range  08/17/14   02/04/2013 102 (H) 65 - 99 mg/dL Final  92/85/7985 97 65 - 99 mg/dL Final   Glucose, Bld  Date Value Ref Range Status  07/01/2023 101 (H) 65 - 99 mg/dL Final    Comment:    .            Fasting reference interval . For someone without known diabetes, a glucose value between 100 and 125 mg/dL is consistent with prediabetes and should be confirmed with a follow-up test. .   12/18/2022 90 65 - 99 mg/dL Final    Comment:    .            Fasting reference interval .   06/20/2022 112 (H) 65 - 99 mg/dL Final    Comment:    .            Fasting reference interval . For someone without known diabetes, a glucose value between 100 and 125 mg/dL is consistent with prediabetes and should be confirmed with a follow-up test. .     Advanced Care Planning:  A voluntary discussion about advance care planning including the explanation and discussion of advance directives.   Discussed health care proxy and Living will, and the patient was able to identify a health care proxy as ***.   Patient {DOES_DOES WNU:81435} have a living will at present time.   Social History       Social History   Socioeconomic History   Marital status: Married    Spouse name: Not on file   Number of children: Not on file   Years of education: Not on file   Highest education level: Bachelor's degree (e.g., BA, AB, BS)  Occupational History   Not on file  Tobacco  Use   Smoking status: Never   Smokeless tobacco: Never  Vaping Use   Vaping status: Never Used  Substance and Sexual Activity   Alcohol use: No   Drug use: No   Sexual activity: Yes  Other Topics Concern   Not on file  Social History Narrative   Not on file   Social Drivers of Health   Financial Resource Strain: Low Risk  (01/20/2024)   Overall Financial Resource Strain (CARDIA)    Difficulty of Paying Living Expenses: Not hard at all  Food Insecurity: No Food Insecurity (01/20/2024)   Hunger Vital Sign    Worried About Running Out of Food in the Last Year: Never true    Ran Out of Food in the Last Year: Never true  Transportation Needs: No Transportation Needs (01/20/2024)   PRAPARE - Administrator, Civil Service (Medical): No    Lack of Transportation (Non-Medical): No  Physical Activity: Insufficiently Active (01/20/2024)   Exercise Vital Sign    Days of Exercise per Week: 2 days    Minutes of Exercise per Session: 40 min  Stress: No Stress Concern Present (01/20/2024)   [employer] of Occupational Health - Occupational Stress Questionnaire    Feeling of Stress: Not at all  Social Connections: Socially Integrated (01/20/2024)   Social Connection and Isolation Panel    Frequency  of Communication with Friends and Family: More than three times a week    Frequency of Social Gatherings with Friends and Family: Twice a week    Attends Religious Services: More than 4 times per year    Active Member of Clubs or Organizations: Yes    Attends Engineer, structural: More than 4 times per year    Marital Status: Married    Family History        Family History  Problem Relation Age of Onset   Diabetes Mother    Hypertension Mother    Kidney disease Mother    Hypertension Father    Prostate cancer Father    Cancer Sister        uterian   Diabetes Maternal Grandmother    Heart failure Maternal Grandmother    Heart failure Paternal Grandfather     Breast cancer Neg Hx     Patient Active Problem List   Diagnosis Date Noted   New onset type 2 diabetes mellitus (HCC) 12/19/2022   Muscle cramps 06/20/2022   Restless legs 06/20/2022   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 12/07/2017   Overweight (BMI 25.0-29.9) 11/18/2017   Carcinoma of upper-outer quadrant of right breast in female, estrogen receptor positive (HCC) 12/23/2015   Dumping syndrome 12/06/2014   Mixed hyperlipidemia 12/06/2014   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  0/10 lymph nodes.   COLONOSCOPY  2013, 2014   Dr. Dellie   KNEE SURGERY Left 2014   MASTECTOMY Right 2012   invasive lobular CA,     Current Outpatient Medications:    amLODipine -benazepril  (LOTREL) 5-10 MG capsule, Take 1 capsule by mouth daily., Disp: 90 capsule, Rfl: 1   AZO-CRANBERRY PO, Take by mouth., Disp: , Rfl:    Calcium-Magnesium-Vitamin D (CALCIUM 1200+D3 PO), , Disp: , Rfl:    diphenhydrAMINE (BENADRYL) 25 MG tablet, Take 50 mg by mouth daily. , Disp: , Rfl:    ezetimibe  (ZETIA ) 10 MG tablet, Take 1 tablet (10 mg total) by mouth daily., Disp: 90 tablet, Rfl: 1   Garlic 1000 MG CAPS, , Disp: , Rfl:    meloxicam  (MOBIC ) 15 MG tablet, TAKE 1 TABLET (15 MG TOTAL) BY MOUTH DAILY., Disp: 30 tablet, Rfl: 3   Misc Natural Products (GLUCOSAMINE CHOND COMPLEX/MSM PO), Take 2 tablets by mouth daily., Disp: , Rfl:    Multiple Vitamins-Minerals  (MULTIPLE VITAMINS/WOMENS PO), , Disp: , Rfl:    nabumetone  (RELAFEN ) 750 MG tablet, Take 1 tablet (750 mg total) by mouth 2 (two) times daily., Disp: 60 tablet, Rfl: 3   potassium chloride  (KLOR-CON  M10) 10 MEQ tablet, TAKE 1 TABLET BY MOUTH DAILY AS NEEDED (CRAMPS AND MUSCLE SPASMS). USE ONLY 1-3 DAYS AT A TIME AND STOP USE IF NOT IMPROVEMENT IN SYMPTOMS, Disp: 10 tablet, Rfl: 5   terbinafine  (LAMISIL ) 250 MG tablet, Take 1 tablet (250 mg total) by mouth daily., Disp: 30 tablet, Rfl: 0   UNABLE  TO FIND, Take 1,175 mg by mouth daily as needed. Med Name: Cyleon Cinnamon /Apple Cider Vinegar, Disp: , Rfl:   Allergies  Allergen Reactions   Macrobid [Nitrofurantoin Macrocrystal]    Sulfa Antibiotics Hives    Other reaction(s): Hives   Tape Other (See Comments)    blisters    Patient Care Team: Leavy Mole, PA-C as PCP - General (Family Medicine) Dessa, Reyes ORN, MD (General Surgery)   Chart Review: I personally reviewed active problem list, medication list, allergies, family history, social history, health maintenance, notes from last encounter, lab results, imaging with the patient/caregiver today.   Review of Systems        Objective:   Vitals:  Vitals:   01/20/24 1447  BP: 132/70  Pulse: 85  Resp: 16  SpO2: 96%  Weight: 182 lb (82.6 kg)  Height: 5' 7 (1.702 m)    Body mass index is 28.51 kg/m.  Physical Exam    Fall Risk:    01/20/2024    2:47 PM 07/01/2023    1:44 PM 12/18/2022    2:05 PM 06/20/2022    1:02 PM 02/26/2022   11:05 AM  Fall Risk   Falls in the past year? 1 0 0 0 0  Number falls in past yr: 0 0 0 0 0  Injury with Fall? 1 0 0 0 0  Risk for fall due to : History of fall(s)  No Fall Risks  No Fall Risks  Follow up Falls prevention discussed  Falls prevention discussed;Education provided;Falls evaluation completed Falls evaluation completed  Falls prevention discussed;Education provided      Data saved with a previous flowsheet row definition     Functional Status Survey: Is the patient deaf or have difficulty hearing?: No Does the patient have difficulty seeing, even when wearing glasses/contacts?: No Does the patient have difficulty concentrating, remembering, or making decisions?: No Does the patient have difficulty walking or climbing stairs?: No Does the patient have difficulty dressing or bathing?: No Does the patient have difficulty doing errands alone such as visiting a doctor's office or shopping?: No   Assessment & Plan:    CPE completed today  USPSTF grade A and B recommendations reviewed with patient; age-appropriate recommendations, preventive care, screening tests, etc discussed and encouraged; healthy living encouraged; see AVS for patient education given to patient  Discussed importance of 150 minutes of physical activity weekly, AHA exercise recommendations given to pt in AVS/handout  Discussed importance of healthy diet:  eating lean meats and proteins, avoiding trans fats and saturated fats, avoid simple sugars and excessive carbs in diet, eat 6 servings of fruit/vegetables daily and drink plenty of water and avoid sweet beverages.    Recommended pt to do annual eye exam and routine dental exams/cleanings  Depression, alcohol, fall screening completed as documented above and per flowsheets  Advance Care planning information and packet discussed and offered today, encouraged pt to discuss with family members/spouse/partner/friends and complete Advanced directive packet and bring copy to office   Reviewed Health Maintenance: Health Maintenance  Topic Date Due   MAMMOGRAM  11/16/2023   HEMOGLOBIN A1C  12/29/2023   OPHTHALMOLOGY EXAM  01/20/2024 (Originally 07/23/1967)   Zoster Vaccines- Shingrix (1 of 2) 04/21/2024 (Originally 07/22/1976)   INFLUENZA VACCINE  09/08/2024 (Originally 01/10/2024)   Pneumococcal Vaccine: 50+ Years (2 of 2 - PCV) 01/19/2025 (Originally 03/03/2013)   Diabetic kidney evaluation -  eGFR measurement  06/30/2024   Diabetic kidney evaluation - Urine ACR  06/30/2024  FOOT EXAM  06/30/2024   DEXA SCAN  01/09/2025   Colonoscopy  01/31/2025   DTaP/Tdap/Td (3 - Td or Tdap) 12/17/2032   Hepatitis C Screening  Completed   Hepatitis B Vaccines  Aged Out   HPV VACCINES  Aged Out   Meningococcal B Vaccine  Aged Out   COVID-19 Vaccine  Discontinued    Immunizations: Immunization History  Administered Date(s) Administered   Influenza Split 04/14/2008, 05/11/2009, 03/06/2010   Influenza, Seasonal, Injecte, Preservative Fre 03/03/2012   Influenza,inj,Quad PF,6+ Mos 04/20/2013, 03/03/2014, 06/07/2015   Pneumococcal Polysaccharide-23 03/03/2012   Tdap 11/28/2011, 12/18/2022   Vaccines:  HPV: up to at age 23 , ask insurance if age between 40-45  Shingrix: 92-64 yo and ask insurance if covered when patient above 4 yo Pneumonia: *** educated and discussed with patient. Flu: *** educated and discussed with patient. COVID:      ICD-10-CM   1. Well adult exam  Z00.00 HgB A1c    Comprehensive Metabolic Panel (CMET)    CBC with Differential/Platelet    2. Mixed hyperlipidemia  E78.2     3. New onset type 2 diabetes mellitus (HCC)  E11.9 Urine Microalbumin w/creat. ratio    4. Benign essential HTN  I10     5. Encounter for medication monitoring  Z51.81     6. Need for pneumococcal vaccination  Z23           Michelene Cower, PA-C 01/20/24 3:09 PM  [facility]

## 2024-01-21 LAB — CBC WITH DIFFERENTIAL/PLATELET
Absolute Lymphocytes: 2138 {cells}/uL (ref 850–3900)
Absolute Monocytes: 396 {cells}/uL (ref 200–950)
Basophils Absolute: 40 {cells}/uL (ref 0–200)
Basophils Relative: 0.6 %
Eosinophils Absolute: 139 {cells}/uL (ref 15–500)
Eosinophils Relative: 2.1 %
HCT: 43.5 % (ref 35.0–45.0)
Hemoglobin: 14.3 g/dL (ref 11.7–15.5)
MCH: 28.9 pg (ref 27.0–33.0)
MCHC: 32.9 g/dL (ref 32.0–36.0)
MCV: 87.9 fL (ref 80.0–100.0)
MPV: 10.5 fL (ref 7.5–12.5)
Monocytes Relative: 6 %
Neutro Abs: 3887 {cells}/uL (ref 1500–7800)
Neutrophils Relative %: 58.9 %
Platelets: 261 Thousand/uL (ref 140–400)
RBC: 4.95 Million/uL (ref 3.80–5.10)
RDW: 13.1 % (ref 11.0–15.0)
Total Lymphocyte: 32.4 %
WBC: 6.6 Thousand/uL (ref 3.8–10.8)

## 2024-01-21 LAB — COMPREHENSIVE METABOLIC PANEL WITH GFR
AG Ratio: 1.6 (calc) (ref 1.0–2.5)
ALT: 23 U/L (ref 6–29)
AST: 20 U/L (ref 10–35)
Albumin: 4.6 g/dL (ref 3.6–5.1)
Alkaline phosphatase (APISO): 86 U/L (ref 37–153)
BUN: 11 mg/dL (ref 7–25)
CO2: 28 mmol/L (ref 20–32)
Calcium: 9.6 mg/dL (ref 8.6–10.4)
Chloride: 104 mmol/L (ref 98–110)
Creat: 0.83 mg/dL (ref 0.50–1.05)
Globulin: 2.8 g/dL (ref 1.9–3.7)
Glucose, Bld: 99 mg/dL (ref 65–99)
Potassium: 3.9 mmol/L (ref 3.5–5.3)
Sodium: 141 mmol/L (ref 135–146)
Total Bilirubin: 0.5 mg/dL (ref 0.2–1.2)
Total Protein: 7.4 g/dL (ref 6.1–8.1)
eGFR: 78 mL/min/1.73m2 (ref >=60)

## 2024-01-21 LAB — HEMOGLOBIN A1C
Hgb A1c MFr Bld: 6 % — ABNORMAL HIGH (ref <5.7)
Mean Plasma Glucose: 126 mg/dL
eAG (mmol/L): 7 mmol/L

## 2024-01-21 LAB — MICROALBUMIN / CREATININE URINE RATIO
Creatinine, Urine: 175 mg/dL (ref 20–275)
Microalb Creat Ratio: 91 mg/g{creat} — ABNORMAL HIGH (ref <30)
Microalb, Ur: 15.9 mg/dL

## 2024-01-22 ENCOUNTER — Ambulatory Visit
Admission: RE | Admit: 2024-01-22 | Discharge: 2024-01-22 | Disposition: A | Discharge status: Home / Self Care | Source: Ambulatory Visit | Attending: Surgery | Admitting: Surgery

## 2024-01-22 DIAGNOSIS — Z1231 Encounter for screening mammogram for malignant neoplasm of breast: Secondary | ICD-10-CM | POA: Insufficient documentation

## 2024-01-29 ENCOUNTER — Encounter: Payer: Self-pay | Admitting: Family Medicine

## 2024-01-29 ENCOUNTER — Ambulatory Visit: Payer: Self-pay | Admitting: Family Medicine

## 2024-01-29 DIAGNOSIS — R252 Cramp and spasm: Secondary | ICD-10-CM

## 2024-01-29 DIAGNOSIS — G2581 Restless legs syndrome: Secondary | ICD-10-CM

## 2024-01-29 DIAGNOSIS — E119 Type 2 diabetes mellitus without complications: Secondary | ICD-10-CM

## 2024-01-29 DIAGNOSIS — I10 Essential (primary) hypertension: Secondary | ICD-10-CM

## 2024-01-29 DIAGNOSIS — E782 Mixed hyperlipidemia: Secondary | ICD-10-CM

## 2024-01-29 MED ORDER — POTASSIUM CHLORIDE CRYS ER 10 MEQ PO TBCR: EXTENDED_RELEASE_TABLET | ORAL | 5 refills | Status: AC

## 2024-01-29 MED ORDER — AMLODIPINE BESY-BENAZEPRIL HCL 5-10 MG PO CAPS: 1.0000 | ORAL_CAPSULE | Freq: Every day | ORAL | 1 refills | Status: AC

## 2024-01-29 MED ORDER — EZETIMIBE 10 MG PO TABS: 10.0000 mg | ORAL_TABLET | Freq: Every day | ORAL | 1 refills | Status: AC

## 2024-03-09 LAB — OPHTHALMOLOGY REPORT-SCANNED

## 2024-03-30 ENCOUNTER — Ambulatory Visit (INDEPENDENT_AMBULATORY_CARE_PROVIDER_SITE_OTHER): Admitting: Podiatry

## 2024-03-30 DIAGNOSIS — M722 Plantar fascial fibromatosis: Secondary | ICD-10-CM

## 2024-03-30 DIAGNOSIS — Z79899 Other long term (current) drug therapy: Secondary | ICD-10-CM | POA: Diagnosis not present

## 2024-03-30 MED ORDER — TERBINAFINE HCL 250 MG PO TABS: 250.0000 mg | ORAL_TABLET | Freq: Every day | ORAL | 1 refills | Status: AC

## 2024-03-30 NOTE — Progress Notes (Signed)
 She presents today with her husband for follow-up of her onychomycosis and states that her plantar fasciitis in her right foot is just starting to hurt maybe a level 1.  She states that she has been doing a lot more because this is another [place] for this year that she has been taking care of and she has been on her feet a lot.  She also states that she went to the beach.  Objective: Vital signs are stable alert oriented x 3.  Pulses are palpable.  Minimal reproducible pain on palpation medial calcaneal tubercle.  Onychomycosis is resolving with the use of Lamisil  to about 90 to 95% at this point.  Assessment: Onychomycosis long-term therapy of Lamisil  resolving 90%.  Some residual plantar fasciitis.  Plan: Discussed etiology pathology conservative surgical therapies I am going to refill her Lamisil  250 mg tablets.  She will start 1 tablet every other day for the next 2 months I did put a refill on that in case she needs it.  Otherwise and we will follow-up with her anywhere from 2 to 4 months.

## 2024-05-26 ENCOUNTER — Other Ambulatory Visit: Payer: Self-pay | Admitting: Podiatry

## 2024-07-28 ENCOUNTER — Ambulatory Visit: Admitting: Family Medicine
